# Patient Record
Sex: Male | Born: 1970 | Race: White | Hispanic: No | Marital: Married | Smoking: Current some day smoker
Health system: Southern US, Community
[De-identification: ages and names within clinical notes are randomized; demographics above are authoritative.]

## PROBLEM LIST (undated history)

## (undated) DIAGNOSIS — Z87442 Personal history of urinary calculi: Secondary | ICD-10-CM

## (undated) DIAGNOSIS — K5790 Diverticulosis of intestine, part unspecified, without perforation or abscess without bleeding: Secondary | ICD-10-CM

## (undated) DIAGNOSIS — Z8614 Personal history of Methicillin resistant Staphylococcus aureus infection: Secondary | ICD-10-CM

## (undated) HISTORY — DX: Diverticulosis of intestine, part unspecified, without perforation or abscess without bleeding: K57.90

## (undated) HISTORY — PX: TONSILLECTOMY: SUR1361

## (undated) HISTORY — PX: KNEE ARTHROSCOPY: SHX127

---

## 2018-07-10 ENCOUNTER — Emergency Department (HOSPITAL_COMMUNITY): Payer: BLUE CROSS/BLUE SHIELD

## 2018-07-10 ENCOUNTER — Encounter (HOSPITAL_COMMUNITY): Payer: Self-pay | Admitting: *Deleted

## 2018-07-10 ENCOUNTER — Inpatient Hospital Stay (HOSPITAL_COMMUNITY)
Admission: EM | Admit: 2018-07-10 | Discharge: 2018-07-18 | DRG: 392 | Disposition: A | Discharge status: Home Health | Payer: BLUE CROSS/BLUE SHIELD | Attending: General Surgery | Admitting: General Surgery

## 2018-07-10 ENCOUNTER — Other Ambulatory Visit: Payer: Self-pay

## 2018-07-10 DIAGNOSIS — K5720 Diverticulitis of large intestine with perforation and abscess without bleeding: Principal | ICD-10-CM | POA: Diagnosis present

## 2018-07-10 DIAGNOSIS — K567 Ileus, unspecified: Secondary | ICD-10-CM | POA: Diagnosis present

## 2018-07-10 DIAGNOSIS — K631 Perforation of intestine (nontraumatic): Secondary | ICD-10-CM

## 2018-07-10 DIAGNOSIS — Z23 Encounter for immunization: Secondary | ICD-10-CM | POA: Diagnosis not present

## 2018-07-10 DIAGNOSIS — K651 Peritoneal abscess: Secondary | ICD-10-CM

## 2018-07-10 DIAGNOSIS — F17200 Nicotine dependence, unspecified, uncomplicated: Secondary | ICD-10-CM | POA: Diagnosis present

## 2018-07-10 DIAGNOSIS — K5732 Diverticulitis of large intestine without perforation or abscess without bleeding: Secondary | ICD-10-CM

## 2018-07-10 DIAGNOSIS — R188 Other ascites: Secondary | ICD-10-CM

## 2018-07-10 DIAGNOSIS — R509 Fever, unspecified: Secondary | ICD-10-CM

## 2018-07-10 DIAGNOSIS — E876 Hypokalemia: Secondary | ICD-10-CM

## 2018-07-10 LAB — URINALYSIS, ROUTINE W REFLEX MICROSCOPIC
Bilirubin Urine: NEGATIVE
Glucose, UA: NEGATIVE mg/dL
Hgb urine dipstick: NEGATIVE
Ketones, ur: 80 mg/dL — AB
Leukocytes, UA: NEGATIVE
NITRITE: NEGATIVE
PH: 5 (ref 5.0–8.0)
Protein, ur: NEGATIVE mg/dL

## 2018-07-10 LAB — CBC WITH DIFFERENTIAL/PLATELET
ABS IMMATURE GRANULOCYTES: 0.04 10*3/uL (ref 0.00–0.07)
BASOS ABS: 0 10*3/uL (ref 0.0–0.1)
BASOS PCT: 1 %
Eosinophils Absolute: 0.1 10*3/uL (ref 0.0–0.5)
Eosinophils Relative: 1 %
HCT: 48.5 % (ref 39.0–52.0)
HEMOGLOBIN: 16.4 g/dL (ref 13.0–17.0)
Immature Granulocytes: 1 %
LYMPHS PCT: 18 %
Lymphs Abs: 1.6 10*3/uL (ref 0.7–4.0)
MCH: 33.5 pg (ref 26.0–34.0)
MCHC: 33.8 g/dL (ref 30.0–36.0)
MCV: 99.2 fL (ref 80.0–100.0)
Monocytes Absolute: 0.5 10*3/uL (ref 0.1–1.0)
Monocytes Relative: 6 %
NEUTROS ABS: 6.4 10*3/uL (ref 1.7–7.7)
NRBC: 0 % (ref 0.0–0.2)
Neutrophils Relative %: 73 %
PLATELETS: 265 10*3/uL (ref 150–400)
RBC: 4.89 MIL/uL (ref 4.22–5.81)
RDW: 12.1 % (ref 11.5–15.5)
WBC: 8.7 10*3/uL (ref 4.0–10.5)

## 2018-07-10 LAB — COMPREHENSIVE METABOLIC PANEL
ALBUMIN: 3.3 g/dL — AB (ref 3.5–5.0)
ALK PHOS: 50 U/L (ref 38–126)
ALT: 20 U/L (ref 0–44)
ANION GAP: 12 (ref 5–15)
AST: 20 U/L (ref 15–41)
BUN: 8 mg/dL (ref 6–20)
CALCIUM: 8.9 mg/dL (ref 8.9–10.3)
CHLORIDE: 101 mmol/L (ref 98–111)
CO2: 24 mmol/L (ref 22–32)
Creatinine, Ser: <0.3 mg/dL — ABNORMAL LOW (ref 0.61–1.24)
GLUCOSE: 166 mg/dL — AB (ref 70–99)
Potassium: 3.8 mmol/L (ref 3.5–5.1)
SODIUM: 137 mmol/L (ref 135–145)
Total Bilirubin: 0.7 mg/dL (ref 0.3–1.2)
Total Protein: 6.5 g/dL (ref 6.5–8.1)

## 2018-07-10 LAB — LIPASE, BLOOD: Lipase: 31 U/L (ref 11–51)

## 2018-07-10 MED ORDER — ZOLPIDEM TARTRATE 5 MG PO TABS: 5.0000 mg | ORAL_TABLET | Freq: Every evening | ORAL | Status: DC | PRN

## 2018-07-10 MED ORDER — ONDANSETRON HCL 4 MG/2ML IJ SOLN: 4.0000 mg | Freq: Four times a day (QID) | INTRAMUSCULAR | Status: DC | PRN

## 2018-07-10 MED ORDER — ENOXAPARIN SODIUM 40 MG/0.4ML ~~LOC~~ SOLN
40.0000 mg | SUBCUTANEOUS | Status: DC
  Administered 2018-07-11 – 2018-07-14 (×4): 40 mg via SUBCUTANEOUS
  Filled 2018-07-10 (×4): qty 0.4

## 2018-07-10 MED ORDER — HYDROMORPHONE HCL 1 MG/ML IJ SOLN
0.5000 mg | Freq: Once | INTRAMUSCULAR | Status: AC
  Administered 2018-07-10: 0.5 mg via INTRAVENOUS
  Filled 2018-07-10: qty 1

## 2018-07-10 MED ORDER — OXYCODONE HCL 5 MG PO TABS
5.0000 mg | ORAL_TABLET | ORAL | Status: DC | PRN
  Administered 2018-07-10 – 2018-07-13 (×8): 10 mg via ORAL
  Administered 2018-07-14 – 2018-07-15 (×2): 5 mg via ORAL
  Filled 2018-07-10 (×4): qty 2
  Filled 2018-07-10: qty 1
  Filled 2018-07-10 (×3): qty 2
  Filled 2018-07-10: qty 1
  Filled 2018-07-10 (×2): qty 2

## 2018-07-10 MED ORDER — ACETAMINOPHEN 650 MG RE SUPP: 650.0000 mg | Freq: Four times a day (QID) | RECTAL | Status: DC | PRN

## 2018-07-10 MED ORDER — PIPERACILLIN-TAZOBACTAM 3.375 G IVPB
3.3750 g | Freq: Three times a day (TID) | INTRAVENOUS | Status: DC
  Administered 2018-07-10 – 2018-07-18 (×23): 3.375 g via INTRAVENOUS
  Filled 2018-07-10 (×23): qty 50

## 2018-07-10 MED ORDER — PANTOPRAZOLE SODIUM 40 MG IV SOLR
40.0000 mg | Freq: Every day | INTRAVENOUS | Status: DC
  Administered 2018-07-10: 40 mg via INTRAVENOUS
  Filled 2018-07-10: qty 40

## 2018-07-10 MED ORDER — IOHEXOL 300 MG/ML  SOLN
100.0000 mL | Freq: Once | INTRAMUSCULAR | Status: AC | PRN
  Administered 2018-07-10: 100 mL via INTRAVENOUS

## 2018-07-10 MED ORDER — HYDRALAZINE HCL 20 MG/ML IJ SOLN: 10.0000 mg | INTRAMUSCULAR | Status: DC | PRN

## 2018-07-10 MED ORDER — PNEUMOCOCCAL VAC POLYVALENT 25 MCG/0.5ML IJ INJ
0.5000 mL | INJECTION | INTRAMUSCULAR | Status: AC
  Administered 2018-07-11: 0.5 mL via INTRAMUSCULAR
  Filled 2018-07-10: qty 0.5

## 2018-07-10 MED ORDER — PIPERACILLIN-TAZOBACTAM 3.375 G IVPB 30 MIN
3.3750 g | Freq: Once | INTRAVENOUS | Status: AC
  Administered 2018-07-10: 3.375 g via INTRAVENOUS
  Filled 2018-07-10: qty 50

## 2018-07-10 MED ORDER — SODIUM CHLORIDE 0.9 % IV BOLUS
1000.0000 mL | Freq: Once | INTRAVENOUS | Status: AC
  Administered 2018-07-10: 1000 mL via INTRAVENOUS

## 2018-07-10 MED ORDER — ONDANSETRON 4 MG PO TBDP: 4.0000 mg | ORAL_TABLET | Freq: Four times a day (QID) | ORAL | Status: DC | PRN

## 2018-07-10 MED ORDER — DIPHENHYDRAMINE HCL 50 MG/ML IJ SOLN: 25.0000 mg | Freq: Four times a day (QID) | INTRAMUSCULAR | Status: DC | PRN

## 2018-07-10 MED ORDER — HYDROMORPHONE HCL 1 MG/ML IJ SOLN
1.0000 mg | INTRAMUSCULAR | Status: DC | PRN
  Administered 2018-07-10 – 2018-07-15 (×9): 1 mg via INTRAVENOUS
  Filled 2018-07-10 (×9): qty 1

## 2018-07-10 MED ORDER — DIPHENHYDRAMINE HCL 25 MG PO CAPS
25.0000 mg | ORAL_CAPSULE | Freq: Four times a day (QID) | ORAL | Status: DC | PRN
  Administered 2018-07-13 – 2018-07-14 (×2): 25 mg via ORAL
  Filled 2018-07-10 (×2): qty 1

## 2018-07-10 MED ORDER — KCL IN DEXTROSE-NACL 20-5-0.45 MEQ/L-%-% IV SOLN
INTRAVENOUS | Status: DC
  Administered 2018-07-10 – 2018-07-12 (×4): via INTRAVENOUS
  Filled 2018-07-10 (×4): qty 1000

## 2018-07-10 MED ORDER — HYDROMORPHONE HCL 1 MG/ML IJ SOLN
1.0000 mg | Freq: Once | INTRAMUSCULAR | Status: AC
  Administered 2018-07-10: 1 mg via INTRAVENOUS
  Filled 2018-07-10: qty 1

## 2018-07-10 MED ORDER — ACETAMINOPHEN 325 MG PO TABS
650.0000 mg | ORAL_TABLET | Freq: Four times a day (QID) | ORAL | Status: DC | PRN
  Administered 2018-07-10 – 2018-07-16 (×9): 650 mg via ORAL
  Filled 2018-07-10 (×10): qty 2

## 2018-07-10 MED ORDER — SODIUM CHLORIDE 0.9 % IV SOLN
Freq: Once | INTRAVENOUS | Status: AC
  Administered 2018-07-10: 14:00:00 via INTRAVENOUS

## 2018-07-10 MED ORDER — ONDANSETRON HCL 4 MG/2ML IJ SOLN
4.0000 mg | Freq: Once | INTRAMUSCULAR | Status: AC
  Administered 2018-07-10: 4 mg via INTRAVENOUS
  Filled 2018-07-10: qty 2

## 2018-07-10 MED ORDER — INFLUENZA VAC SPLIT QUAD 0.5 ML IM SUSY
0.5000 mL | PREFILLED_SYRINGE | INTRAMUSCULAR | Status: AC
  Administered 2018-07-11: 0.5 mL via INTRAMUSCULAR
  Filled 2018-07-10: qty 0.5

## 2018-07-10 NOTE — ED Triage Notes (Signed)
PT reports ABD pain started WED this week

## 2018-07-10 NOTE — H&P (Signed)
Douglas Weber is an 47 y.o. male.   Chief Complaint: Lower abdominal pain HPI: Otherwise healthy gentleman developed some lower abdominal pain on Tuesday evening.  On Wednesday it worsened and he had a fever.  It seemed to get better after that but again worsened this morning and he came to the emergency department.  He was evaluated with CT scan of the abdomen pelvis which demonstrated sigmoid diverticulitis with some scattered bubbles of free air.  He was given some pain medication in the emergency department and is doing a little better at this time.  History reviewed. No pertinent past medical history.  History reviewed. No pertinent surgical history.  History reviewed. No pertinent family history. Social History:  reports that he has been smoking. He has never used smokeless tobacco. He reports that he drinks about 1.0 standard drinks of alcohol per week. He reports that he has current or past drug history. Drug: Marijuana.  Allergies: No Known Allergies   (Not in a hospital admission)  Results for orders placed or performed during the hospital encounter of 07/10/18 (from the past 48 hour(s))  CBC with Differential     Status: None   Collection Time: 07/10/18 11:22 AM  Result Value Ref Range   WBC 8.7 4.0 - 10.5 K/uL   RBC 4.89 4.22 - 5.81 MIL/uL   Hemoglobin 16.4 13.0 - 17.0 g/dL   HCT 48.5 39.0 - 52.0 %   MCV 99.2 80.0 - 100.0 fL   MCH 33.5 26.0 - 34.0 pg   MCHC 33.8 30.0 - 36.0 g/dL   RDW 12.1 11.5 - 15.5 %   Platelets 265 150 - 400 K/uL   nRBC 0.0 0.0 - 0.2 %   Neutrophils Relative % 73 %   Neutro Abs 6.4 1.7 - 7.7 K/uL   Lymphocytes Relative 18 %   Lymphs Abs 1.6 0.7 - 4.0 K/uL   Monocytes Relative 6 %   Monocytes Absolute 0.5 0.1 - 1.0 K/uL   Eosinophils Relative 1 %   Eosinophils Absolute 0.1 0.0 - 0.5 K/uL   Basophils Relative 1 %   Basophils Absolute 0.0 0.0 - 0.1 K/uL   Immature Granulocytes 1 %   Abs Immature Granulocytes 0.04 0.00 - 0.07 K/uL    Comment:  Performed at [facility], [address] [place], [place] [address]  Comprehensive metabolic panel     Status: Abnormal   Collection Time: 07/10/18 11:22 AM  Result Value Ref Range   Sodium 137 135 - 145 mmol/L   Potassium 3.8 3.5 - 5.1 mmol/L   Chloride 101 98 - 111 mmol/L   CO2 24 22 - 32 mmol/L   Glucose, Bld 166 (H) 70 - 99 mg/dL   BUN 8 6 - 20 mg/dL   Creatinine, Ser <0.30 (L) 0.61 - 1.24 mg/dL   Calcium 8.9 8.9 - 10.3 mg/dL   Total Protein 6.5 6.5 - 8.1 g/dL   Albumin 3.3 (L) 3.5 - 5.0 g/dL   AST 20 15 - 41 U/L   ALT 20 0 - 44 U/L   Alkaline Phosphatase 50 38 - 126 U/L   Total Bilirubin 0.7 0.3 - 1.2 mg/dL   GFR calc non Af Amer NOT CALCULATED >60 mL/min   GFR calc Af Amer NOT CALCULATED >60 mL/min    Comment: (NOTE) The eGFR has been calculated using the CKD EPI equation. This calculation has not been validated in all clinical situations. eGFR's persistently <60 mL/min signify possible Chronic Kidney Disease.  Anion gap 12 5 - 15    Comment: Performed at [facility] [address] [place], [place] [address]  Lipase, blood     Status: None   Collection Time: 07/10/18 11:22 AM  Result Value Ref Range   Lipase 31 11 - 51 U/L    Comment: Performed at [facility] [address] [place], [place] [address]   Ct Abdomen Pelvis W Contrast  Result Date: 07/10/2018 CLINICAL DATA:  47 year old male with generalize abdominal pain and nausea since 10 a.m. EXAM: CT ABDOMEN AND PELVIS WITH CONTRAST TECHNIQUE: Multidetector CT imaging of the abdomen and pelvis was performed using the standard protocol following bolus administration of intravenous contrast. CONTRAST:  188m OMNIPAQUE IOHEXOL 300 MG/ML  SOLN COMPARISON:  No priors. FINDINGS: Lower chest: There are no aggressive appearing lytic or blastic lesions noted in the visualized portions of the skeleton. Hepatobiliary: No suspicious cystic or solid hepatic lesions. No intra or extrahepatic biliary ductal  dilatation. Gallbladder is normal in appearance. Pancreas: No pancreatic mass. No pancreatic ductal dilatation. No pancreatic or peripancreatic fluid or inflammatory changes. Spleen: Unremarkable. Adrenals/Urinary Tract: Bilateral kidneys and adrenal glands are normal in appearance. No hydroureteronephrosis. Urinary bladder is normal in appearance. Stomach/Bowel: Normal appearance of the stomach. No pathologic dilatation of small bowel or colon. Numerous colonic diverticulae are noted. In the region of the mid sigmoid colon there are extensive inflammatory changes adjacent to several diverticulae where there is also extensive mural thickening, and a small amount of extraluminal gas extending into the sigmoid mesocolon, compatible with an acute perforated diverticulitis. Appendix is not confidently identified may be surgically absent. Vascular/Lymphatic: Aortic atherosclerosis, without evidence of aneurysm or dissection in the abdominal or pelvic vasculature. Circumaortic left renal vein (normal anatomical variant) incidentally noted. No lymphadenopathy noted in the abdomen or pelvis. Reproductive: Prostate gland and seminal vesicles are unremarkable in appearance. Other: Large amount of pneumoperitoneum. Trace amount of fluid in the left pericolic gutter and in the low left hemipelvis. Musculoskeletal: There are no aggressive appearing lytic or blastic lesions noted in the visualized portions of the skeleton. IMPRESSION: 1. Perforated acute diverticulitis in the mid sigmoid colon. Emergent surgical consultation is strongly recommended. 2. Aortic atherosclerosis. Critical Value/emergent results were called by telephone at the time of interpretation on 07/10/2018 at 1:43 pm to Dr. MAletta Edouard who verbally acknowledged these results. Electronically Signed   By: DVinnie LangtonM.D.   On: 07/10/2018 13:45    Review of Systems  Constitutional: Positive for fever.  HENT: Negative for hearing loss.   Eyes:  Negative for blurred vision.  Respiratory: Negative for cough and shortness of breath.   Cardiovascular: Negative for chest pain.  Gastrointestinal: Positive for abdominal pain. Negative for blood in stool, nausea and vomiting.  Genitourinary: Negative.   Musculoskeletal: Negative.   Skin: Negative.   Neurological: Negative.   Endo/Heme/Allergies: Negative.   Psychiatric/Behavioral: Negative.     Blood pressure 124/82, pulse 79, temperature 97.9 F (36.6 C), temperature source Oral, resp. rate 20, height '5\' 7"'$  (1.702 m), weight 78 kg, SpO2 97 %. Physical Exam  Constitutional: He is oriented to person, place, and time. He appears well-developed and well-nourished. No distress.  HENT:  Head: Normocephalic.  Right Ear: External ear normal.  Left Ear: External ear normal.  Nose: Nose normal.  Mouth/Throat: Oropharynx is clear and moist.  Eyes: Pupils are equal, round, and reactive to light. EOM are normal.  Neck: Neck supple. No tracheal deviation present. No  thyromegaly present.  Cardiovascular: Normal rate, regular rhythm, normal heart sounds and intact distal pulses.  Respiratory: Effort normal and breath sounds normal. No respiratory distress. He has no wheezes. He has no rales.  GI: Soft. He exhibits no distension. There is tenderness. There is guarding. There is no rebound.  Tender in the left lower quadrant and centrally with some voluntary guarding, no generalized peritonitis  Musculoskeletal: Normal range of motion.  Neurological: He is alert and oriented to person, place, and time.  Skin: Skin is warm and dry.  Psychiatric: He has a normal mood and affect.     Assessment/Plan Acute sigmoid diverticulitis with perforation and some small scattered bubbles of free intraperitoneal air - IV Zosyn, admit, bowel rest.  This should improve with treatment.  If he worsens, I did discuss with him that he would need a partial colectomy with colostomy.  He is hoping this resolves with  antibiotic treatment.  Zenovia Jarred, MD 07/10/2018, 2:30 PM

## 2018-07-10 NOTE — ED Notes (Signed)
Patient transported to CT 

## 2018-07-10 NOTE — Plan of Care (Signed)
  Problem: Coping: Goal: Level of anxiety will decrease Outcome: Not Progressing   Problem: Pain Managment: Goal: General experience of comfort will improve Outcome: Not Progressing   

## 2018-07-10 NOTE — ED Provider Notes (Signed)
[facility] EMERGENCY DEPARTMENT Provider Note   CSN: [id number] Arrival date & time: 07/10/18  1051     History   Chief Complaint Chief Complaint  Patient presents with  . Abdominal Pain    HPI Douglas Weber is a 47 y.o. male.  He presents to the hospital with severe abdominal pain.  He said it started on Wednesday when he had a fever to 101.5 and thought he had a GI bug.  He went to urgent care on Thursday and they checked a urinalysis and told him he had some blood and sugar in his urine.  He was feeling better yesterday and even today when he woke up.  Yellow once experience severe 10 out of 10 lower abdominal pain.  It is associated with nausea and vomiting, no blood from above or below.  He is never had anything like this before.  Denies any recent travel or sick contacts.  He is tried nothing for it.  The history is provided by the patient.  Abdominal Pain   This is a new problem. Episode onset: 4 days - worse today. The problem has been rapidly worsening. The pain is associated with an unknown factor. The pain is located in the LLQ, RLQ and suprapubic region. The quality of the pain is sharp and cramping. The pain is at a severity of 10/10. Associated symptoms include fever, diarrhea, nausea and vomiting. Pertinent negatives include melena, dysuria, frequency and headaches. Nothing aggravates the symptoms. Nothing relieves the symptoms.    History reviewed. No pertinent past medical history.  There are no active problems to display for this patient.   History reviewed. No pertinent surgical history.      Home Medications    Prior to Admission medications   Not on File    Family History History reviewed. No pertinent family history.  Social History Social History   Tobacco Use  . Smoking status: Current Some Day Smoker  . Smokeless tobacco: Never Used  Substance Use Topics  . Alcohol use: Yes    Alcohol/week: 1.0 standard drinks    Types:  1 Glasses of wine per week  . Drug use: Yes    Types: Marijuana    Comment: unsure     Allergies   Patient has no known allergies.   Review of Systems Review of Systems  Constitutional: Positive for fever.  HENT: Negative for sore throat.   Eyes: Negative for visual disturbance.  Respiratory: Negative for shortness of breath.   Cardiovascular: Negative for chest pain.  Gastrointestinal: Positive for abdominal pain, diarrhea, nausea and vomiting. Negative for melena.  Genitourinary: Negative for dysuria and frequency.  Musculoskeletal: Negative for back pain.  Skin: Negative for rash.  Neurological: Negative for headaches.     Physical Exam Updated Vital Signs BP 127/89   Pulse 75   Temp 97.9 F (36.6 C) (Oral)   Resp 20   Ht 5\' 7"  (1.702 m)   Wt 78 kg   SpO2 (!) 10%   BMI 26.94 kg/m   Physical Exam  Constitutional: He is oriented to person, place, and time. He appears well-developed and well-nourished.  HENT:  Head: Normocephalic and atraumatic.  Eyes: Conjunctivae are normal.  Neck: Neck supple.  Cardiovascular: Normal rate and regular rhythm.  No murmur heard. Pulmonary/Chest: Effort normal and breath sounds normal. No respiratory distress.  Abdominal: Soft. He exhibits no mass. There is tenderness in the right lower quadrant and left lower quadrant. There is guarding. There  is no rigidity.  Musculoskeletal: He exhibits no edema, tenderness or deformity.  Neurological: He is alert and oriented to person, place, and time.  Skin: Skin is warm and dry. Capillary refill takes less than 2 seconds.  Psychiatric: He has a normal mood and affect.  Nursing note and vitals reviewed.    ED Treatments / Results  Labs (all labs ordered are listed, but only abnormal results are displayed) Labs Reviewed  COMPREHENSIVE METABOLIC PANEL - Abnormal; Notable for the following components:      Result Value   Glucose, Bld 166 (*)    Creatinine, Ser <0.30 (*)    Albumin  3.3 (*)    All other components within normal limits  URINALYSIS, ROUTINE W REFLEX MICROSCOPIC - Abnormal; Notable for the following components:   Specific Gravity, Urine >1.046 (*)    Ketones, ur 80 (*)    All other components within normal limits  CBC WITH DIFFERENTIAL/PLATELET  LIPASE, BLOOD  HIV ANTIBODY (ROUTINE TESTING W REFLEX)    EKG None  Radiology Ct Abdomen Pelvis W Contrast  Result Date: 07/10/2018 CLINICAL DATA:  47 year old male with generalize abdominal pain and nausea since 10 a.m. EXAM: CT ABDOMEN AND PELVIS WITH CONTRAST TECHNIQUE: Multidetector CT imaging of the abdomen and pelvis was performed using the standard protocol following bolus administration of intravenous contrast. CONTRAST:  131mL OMNIPAQUE IOHEXOL 300 MG/ML  SOLN COMPARISON:  No priors. FINDINGS: Lower chest: There are no aggressive appearing lytic or blastic lesions noted in the visualized portions of the skeleton. Hepatobiliary: No suspicious cystic or solid hepatic lesions. No intra or extrahepatic biliary ductal dilatation. Gallbladder is normal in appearance. Pancreas: No pancreatic mass. No pancreatic ductal dilatation. No pancreatic or peripancreatic fluid or inflammatory changes. Spleen: Unremarkable. Adrenals/Urinary Tract: Bilateral kidneys and adrenal glands are normal in appearance. No hydroureteronephrosis. Urinary bladder is normal in appearance. Stomach/Bowel: Normal appearance of the stomach. No pathologic dilatation of small bowel or colon. Numerous colonic diverticulae are noted. In the region of the mid sigmoid colon there are extensive inflammatory changes adjacent to several diverticulae where there is also extensive mural thickening, and a small amount of extraluminal gas extending into the sigmoid mesocolon, compatible with an acute perforated diverticulitis. Appendix is not confidently identified may be surgically absent. Vascular/Lymphatic: Aortic atherosclerosis, without evidence of aneurysm  or dissection in the abdominal or pelvic vasculature. Circumaortic left renal vein (normal anatomical variant) incidentally noted. No lymphadenopathy noted in the abdomen or pelvis. Reproductive: Prostate gland and seminal vesicles are unremarkable in appearance. Other: Large amount of pneumoperitoneum. Trace amount of fluid in the left pericolic gutter and in the low left hemipelvis. Musculoskeletal: There are no aggressive appearing lytic or blastic lesions noted in the visualized portions of the skeleton. IMPRESSION: 1. Perforated acute diverticulitis in the mid sigmoid colon. Emergent surgical consultation is strongly recommended. 2. Aortic atherosclerosis. Critical Value/emergent results were called by telephone at the time of interpretation on 07/10/2018 at 1:43 pm to Dr. Aletta Edouard, who verbally acknowledged these results. Electronically Signed   By: Vinnie Langton M.D.   On: 07/10/2018 13:45    Procedures Procedures (including critical care time)  Medications Ordered in ED Medications  HYDROmorphone (DILAUDID) injection 1 mg (has no administration in time range)  ondansetron (ZOFRAN) injection 4 mg (has no administration in time range)  sodium chloride 0.9 % bolus 1,000 mL (has no administration in time range)     Initial Impression / Assessment and Plan / ED Course  I have reviewed  the triage vital signs and the nursing notes.  Pertinent labs & imaging results that were available during my care of the patient were reviewed by me and considered in my medical decision making (see chart for details).  Clinical Course as of Jul 11 1539  Sat Jul 10, 2018  1243 Patient states his pain is improved but still is uncomfortable.  Of updated him on his results so far and he understands he is waiting for CT.  Of asked him not to eat or drink but he is asking for something disturbance his mouth.   [MB]  9675 Reviewed the results with the patient and his wife.  I have ordered Zosyn for  antibiotics and have consulted general surgery.  Dr. Grandville Silos will evaluate the patient.   [MB]  1450 Patient seen by Dr. Grandville Silos and he will admit to his service.   [MB]    Clinical Course User Index [MB] Hayden Rasmussen, MD      Final Clinical Impressions(s) / ED Diagnoses   Final diagnoses:  Diverticulitis of colon  Intestinal perforation [facility])    ED Discharge Orders    None       Hayden Rasmussen, MD 07/10/18 1541

## 2018-07-11 LAB — CBC
HEMATOCRIT: 47.3 % (ref 39.0–52.0)
Hemoglobin: 16.5 g/dL (ref 13.0–17.0)
MCH: 33.8 pg (ref 26.0–34.0)
MCHC: 34.9 g/dL (ref 30.0–36.0)
MCV: 96.9 fL (ref 80.0–100.0)
NRBC: 0 % (ref 0.0–0.2)
Platelets: 207 10*3/uL (ref 150–400)
RBC: 4.88 MIL/uL (ref 4.22–5.81)
RDW: 11.9 % (ref 11.5–15.5)
WBC: 4.9 10*3/uL (ref 4.0–10.5)

## 2018-07-11 LAB — HIV ANTIBODY (ROUTINE TESTING W REFLEX): HIV Screen 4th Generation wRfx: NONREACTIVE

## 2018-07-11 LAB — BASIC METABOLIC PANEL
Anion gap: 9 (ref 5–15)
BUN: 8 mg/dL (ref 6–20)
CALCIUM: 8.4 mg/dL — AB (ref 8.9–10.3)
CHLORIDE: 102 mmol/L (ref 98–111)
CO2: 23 mmol/L (ref 22–32)
CREATININE: 1.23 mg/dL (ref 0.61–1.24)
GFR calc non Af Amer: >60 mL/min (ref >60)
Glucose, Bld: 151 mg/dL — ABNORMAL HIGH (ref 70–99)
Potassium: 4.1 mmol/L (ref 3.5–5.1)
SODIUM: 134 mmol/L — AB (ref 135–145)

## 2018-07-11 MED ORDER — FAMOTIDINE IN NACL 20-0.9 MG/50ML-% IV SOLN
20.0000 mg | INTRAVENOUS | Status: DC
  Administered 2018-07-11 – 2018-07-12 (×2): 20 mg via INTRAVENOUS
  Filled 2018-07-11 (×3): qty 50

## 2018-07-11 NOTE — Progress Notes (Addendum)
Subjective/Chief Complaint: States that pain is significantly improved.  Denies n/v.  No flatus.     Objective: Vital signs in last 24 hours: Temp:  [97.9 F (36.6 C)-100.1 F (37.8 C)] 98.5 F (36.9 C) (11/10 0600) Pulse Rate:  [72-127] 109 (11/10 0600) Resp:  [16-22] 18 (11/10 0600) BP: (120-151)/(72-92) 127/72 (11/10 0600) SpO2:  [94 %-100 %] 97 % (11/10 0600) Weight:  [78 kg] 78 kg (11/09 1058) Last BM Date: 07/10/18  Intake/Output from previous day: 11/09 0701 - 11/10 0700 In: 1305.4 [I.V.:1260.2; IV Piggyback:45.3] Out: -  Intake/Output this shift: No intake/output data recorded.  General appearance: alert, cooperative and no distress Resp: breathing comfortably. GI: soft, mildly distended.  tender LLQ, suprapubic region.  no tenderness upper or right abdomen Extremities: extremities normal, atraumatic, no cyanosis or edema  Lab Results:  Recent Labs    07/10/18 1122 07/11/18 0432  WBC 8.7 4.9  HGB 16.4 16.5  HCT 48.5 47.3  PLT 265 207   BMET Recent Labs    07/10/18 1122 07/11/18 0432  NA 137 134*  K 3.8 4.1  CL 101 102  CO2 24 23  GLUCOSE 166* 151*  BUN 8 8  CREATININE <0.30* 1.23  CALCIUM 8.9 8.4*   PT/INR No results for input(s): LABPROT, INR in the last 72 hours. ABG No results for input(s): PHART, HCO3 in the last 72 hours.  Invalid input(s): PCO2, PO2  Studies/Results: Ct Abdomen Pelvis W Contrast  Result Date: 07/10/2018 CLINICAL DATA:  47 year old male with generalize abdominal pain and nausea since 10 a.m. EXAM: CT ABDOMEN AND PELVIS WITH CONTRAST TECHNIQUE: Multidetector CT imaging of the abdomen and pelvis was performed using the standard protocol following bolus administration of intravenous contrast. CONTRAST:  181mL OMNIPAQUE IOHEXOL 300 MG/ML  SOLN COMPARISON:  No priors. FINDINGS: Lower chest: There are no aggressive appearing lytic or blastic lesions noted in the visualized portions of the skeleton. Hepatobiliary: No  suspicious cystic or solid hepatic lesions. No intra or extrahepatic biliary ductal dilatation. Gallbladder is normal in appearance. Pancreas: No pancreatic mass. No pancreatic ductal dilatation. No pancreatic or peripancreatic fluid or inflammatory changes. Spleen: Unremarkable. Adrenals/Urinary Tract: Bilateral kidneys and adrenal glands are normal in appearance. No hydroureteronephrosis. Urinary bladder is normal in appearance. Stomach/Bowel: Normal appearance of the stomach. No pathologic dilatation of small bowel or colon. Numerous colonic diverticulae are noted. In the region of the mid sigmoid colon there are extensive inflammatory changes adjacent to several diverticulae where there is also extensive mural thickening, and a small amount of extraluminal gas extending into the sigmoid mesocolon, compatible with an acute perforated diverticulitis. Appendix is not confidently identified may be surgically absent. Vascular/Lymphatic: Aortic atherosclerosis, without evidence of aneurysm or dissection in the abdominal or pelvic vasculature. Circumaortic left renal vein (normal anatomical variant) incidentally noted. No lymphadenopathy noted in the abdomen or pelvis. Reproductive: Prostate gland and seminal vesicles are unremarkable in appearance. Other: Large amount of pneumoperitoneum. Trace amount of fluid in the left pericolic gutter and in the low left hemipelvis. Musculoskeletal: There are no aggressive appearing lytic or blastic lesions noted in the visualized portions of the skeleton. IMPRESSION: 1. Perforated acute diverticulitis in the mid sigmoid colon. Emergent surgical consultation is strongly recommended. 2. Aortic atherosclerosis. Critical Value/emergent results were called by telephone at the time of interpretation on 07/10/2018 at 1:43 pm to Dr. Aletta Edouard, who verbally acknowledged these results. Electronically Signed   By: Vinnie Langton M.D.   On: 07/10/2018 13:45  Anti-infectives: Anti-infectives (From admission, onward)   Start     Dose/Rate Route Frequency Ordered Stop   07/10/18 2230  piperacillin-tazobactam (ZOSYN) IVPB 3.375 g     3.375 g 12.5 mL/hr over 240 Minutes Intravenous Every 8 hours 07/10/18 1454     07/10/18 1400  piperacillin-tazobactam (ZOSYN) IVPB 3.375 g     3.375 g 100 mL/hr over 30 Minutes Intravenous  Once 07/10/18 1348 07/10/18 1457      Assessment/Plan: s/p * No surgery found * sigmoid diverticulitis with microperforation.   Improving clinically. Continue IV antibiotics (zosyn d2) Sips/ice chips only. Bowel rest. Change to H2 blocker from PPI lovenox for vte ppx.   LOS: 1 day    Stark Klein 07/11/2018

## 2018-07-11 NOTE — Plan of Care (Signed)
  Problem: Education: Goal: Knowledge of General Education information will improve Description Including pain rating scale, medication(s)/side effects and non-pharmacologic comfort measures Outcome: Progressing   Problem: Health Behavior/Discharge Planning: Goal: Ability to manage health-related needs will improve Outcome: Progressing   

## 2018-07-12 MED ORDER — SODIUM CHLORIDE 0.9 % IV SOLN
INTRAVENOUS | Status: DC
  Administered 2018-07-12 (×2): via INTRAVENOUS

## 2018-07-12 MED ORDER — DOCUSATE SODIUM 100 MG PO CAPS
100.0000 mg | ORAL_CAPSULE | Freq: Two times a day (BID) | ORAL | Status: DC
  Administered 2018-07-12 – 2018-07-16 (×7): 100 mg via ORAL
  Filled 2018-07-12 (×9): qty 1

## 2018-07-12 NOTE — Progress Notes (Signed)
Subjective/Chief Complaint: Had a great night. Slept well. Minimal pain.  Had a bowel movement this morning.    Objective: Vital signs in last 24 hours: Temp:  [98 F (36.7 C)-99.1 F (37.3 C)] 99.1 F (37.3 C) (11/11 0500) Pulse Rate:  [103-108] 103 (11/11 0500) Resp:  [18] 18 (11/11 0500) BP: (108-131)/(67-97) 130/81 (11/11 0500) SpO2:  [95 %-99 %] 95 % (11/11 0500) Last BM Date: 07/10/18  Intake/Output from previous day: 11/10 0701 - 11/11 0700 In: 850 [I.V.:800; IV Piggyback:50] Out: -  Intake/Output this shift: No intake/output data recorded.  General appearance: alert, cooperative and no distress Resp: breathing comfortably. GI: soft, mildly distended.  Mildly tender lateral LLQ, suprapubic region.  no tenderness upper or right abdomen Extremities: extremities normal, atraumatic, no cyanosis or edema  Lab Results:  Recent Labs    07/10/18 1122 07/11/18 0432  WBC 8.7 4.9  HGB 16.4 16.5  HCT 48.5 47.3  PLT 265 207   BMET Recent Labs    07/10/18 1122 07/11/18 0432  NA 137 134*  K 3.8 4.1  CL 101 102  CO2 24 23  GLUCOSE 166* 151*  BUN 8 8  CREATININE <0.30* 1.23  CALCIUM 8.9 8.4*   PT/INR No results for input(s): LABPROT, INR in the last 72 hours. ABG No results for input(s): PHART, HCO3 in the last 72 hours.  Invalid input(s): PCO2, PO2  Studies/Results: Ct Abdomen Pelvis W Contrast  Result Date: 07/10/2018 CLINICAL DATA:  47 year old male with generalize abdominal pain and nausea since 10 a.m. EXAM: CT ABDOMEN AND PELVIS WITH CONTRAST TECHNIQUE: Multidetector CT imaging of the abdomen and pelvis was performed using the standard protocol following bolus administration of intravenous contrast. CONTRAST:  11mL OMNIPAQUE IOHEXOL 300 MG/ML  SOLN COMPARISON:  No priors. FINDINGS: Lower chest: There are no aggressive appearing lytic or blastic lesions noted in the visualized portions of the skeleton. Hepatobiliary: No suspicious cystic or solid  hepatic lesions. No intra or extrahepatic biliary ductal dilatation. Gallbladder is normal in appearance. Pancreas: No pancreatic mass. No pancreatic ductal dilatation. No pancreatic or peripancreatic fluid or inflammatory changes. Spleen: Unremarkable. Adrenals/Urinary Tract: Bilateral kidneys and adrenal glands are normal in appearance. No hydroureteronephrosis. Urinary bladder is normal in appearance. Stomach/Bowel: Normal appearance of the stomach. No pathologic dilatation of small bowel or colon. Numerous colonic diverticulae are noted. In the region of the mid sigmoid colon there are extensive inflammatory changes adjacent to several diverticulae where there is also extensive mural thickening, and a small amount of extraluminal gas extending into the sigmoid mesocolon, compatible with an acute perforated diverticulitis. Appendix is not confidently identified may be surgically absent. Vascular/Lymphatic: Aortic atherosclerosis, without evidence of aneurysm or dissection in the abdominal or pelvic vasculature. Circumaortic left renal vein (normal anatomical variant) incidentally noted. No lymphadenopathy noted in the abdomen or pelvis. Reproductive: Prostate gland and seminal vesicles are unremarkable in appearance. Other: Large amount of pneumoperitoneum. Trace amount of fluid in the left pericolic gutter and in the low left hemipelvis. Musculoskeletal: There are no aggressive appearing lytic or blastic lesions noted in the visualized portions of the skeleton. IMPRESSION: 1. Perforated acute diverticulitis in the mid sigmoid colon. Emergent surgical consultation is strongly recommended. 2. Aortic atherosclerosis. Critical Value/emergent results were called by telephone at the time of interpretation on 07/10/2018 at 1:43 pm to Dr. Aletta Edouard, who verbally acknowledged these results. Electronically Signed   By: Vinnie Langton M.D.   On: 07/10/2018 13:45    Anti-infectives: Anti-infectives (From  admission, onward)   Start     Dose/Rate Route Frequency Ordered Stop   07/10/18 2230  piperacillin-tazobactam (ZOSYN) IVPB 3.375 g     3.375 g 12.5 mL/hr over 240 Minutes Intravenous Every 8 hours 07/10/18 1454     07/10/18 1400  piperacillin-tazobactam (ZOSYN) IVPB 3.375 g     3.375 g 100 mL/hr over 30 Minutes Intravenous  Once 07/10/18 1348 07/10/18 1457      Assessment/Plan: s/p * No surgery found * sigmoid diverticulitis with microperforation.   Improving clinically. Continue IV antibiotics (zosyn d2) Try clears today lovenox for vte ppx.   LOS: 2 days    Douglas Weber 07/12/2018

## 2018-07-12 NOTE — Plan of Care (Signed)
  Problem: Activity: Goal: Risk for activity intolerance will decrease Outcome: Progressing   Problem: Nutrition: Goal: Adequate nutrition will be maintained Outcome: Progressing   Problem: Coping: Goal: Level of anxiety will decrease Outcome: Progressing   Problem: Pain Managment: Goal: General experience of comfort will improve Outcome: Progressing   Problem: Safety: Goal: Ability to remain free from injury will improve Outcome: Progressing   

## 2018-07-13 LAB — BASIC METABOLIC PANEL
ANION GAP: 7 (ref 5–15)
BUN: 7 mg/dL (ref 6–20)
CALCIUM: 8.1 mg/dL — AB (ref 8.9–10.3)
CO2: 24 mmol/L (ref 22–32)
Chloride: 101 mmol/L (ref 98–111)
Creatinine, Ser: 1.18 mg/dL (ref 0.61–1.24)
GLUCOSE: 92 mg/dL (ref 70–99)
POTASSIUM: 3.6 mmol/L (ref 3.5–5.1)
SODIUM: 132 mmol/L — AB (ref 135–145)

## 2018-07-13 LAB — MAGNESIUM: MAGNESIUM: 1.8 mg/dL (ref 1.7–2.4)

## 2018-07-13 LAB — CBC
HCT: 40.7 % (ref 39.0–52.0)
HEMOGLOBIN: 14.1 g/dL (ref 13.0–17.0)
MCH: 33.5 pg (ref 26.0–34.0)
MCHC: 34.6 g/dL (ref 30.0–36.0)
MCV: 96.7 fL (ref 80.0–100.0)
PLATELETS: 211 10*3/uL (ref 150–400)
RBC: 4.21 MIL/uL — ABNORMAL LOW (ref 4.22–5.81)
RDW: 12 % (ref 11.5–15.5)
WBC: 8.9 10*3/uL (ref 4.0–10.5)
nRBC: 0 % (ref 0.0–0.2)

## 2018-07-13 MED ORDER — FAMOTIDINE 20 MG PO TABS
20.0000 mg | ORAL_TABLET | Freq: Every day | ORAL | Status: DC
  Administered 2018-07-13 – 2018-07-17 (×5): 20 mg via ORAL
  Filled 2018-07-13 (×5): qty 1

## 2018-07-13 NOTE — Progress Notes (Signed)
    CC: Abdominal pain  Subjective: He is doing better.  He still points to an area left posterior lateral flank that is tender.  He did well with clear liquids.  He complains of voiding a lot.  Otherwise doing well.  Objective: Vital signs in last 24 hours: Temp:  [98.8 F (37.1 C)-100.4 F (38 C)] 99.7 F (37.6 C) (11/12 0501) Pulse Rate:  [83-104] 83 (11/12 0501) Resp:  [16-18] 16 (11/12 0501) BP: (122-143)/(74-93) 122/88 (11/12 0501) SpO2:  [94 %-96 %] 96 % (11/12 0501) Last BM Date: 07/10/18 740PO 750 IV Urine x 6 Afebrile, VSS BMP OK  WBC 8.9   Intake/Output from previous day: 11/11 0701 - 11/12 0700 In: 2620 [P.O.:740; I.V.:750; IV Piggyback:100] Out: -  Intake/Output this shift: No intake/output data recorded.  General appearance: alert, cooperative and no distress Resp: clear to auscultation bilaterally GI: Soft, nontender except for the left lateral posterior flank.  Positive bowel sounds.  He is still slightly distended.  Lab Results:  Recent Labs    07/11/18 0432 07/13/18 0409  WBC 4.9 8.9  HGB 16.5 14.1  HCT 47.3 40.7  PLT 207 211    BMET Recent Labs    07/11/18 0432 07/13/18 0409  NA 134* 132*  K 4.1 3.6  CL 102 101  CO2 23 24  GLUCOSE 151* 92  BUN 8 7  CREATININE 1.23 1.18  CALCIUM 8.4* 8.1*   PT/INR No results for input(s): LABPROT, INR in the last 72 hours.  Recent Labs  Lab 07/10/18 1122  AST 20  ALT 20  ALKPHOS 50  BILITOT 0.7  PROT 6.5  ALBUMIN 3.3*     Lipase     Component Value Date/Time   LIPASE 31 07/10/2018 1122     Medications: . docusate sodium  100 mg Oral BID  . enoxaparin (LOVENOX) injection  40 mg Subcutaneous Q24H   . sodium chloride 125 mL/hr at 07/12/18 1743  . famotidine (PEPCID) IV 20 mg (07/12/18 0933)  . piperacillin-tazobactam (ZOSYN)  IV 3.375 g (07/13/18 0516)   Anti-infectives (From admission, onward)   Start     Dose/Rate Route Frequency Ordered Stop   07/10/18 2230   piperacillin-tazobactam (ZOSYN) IVPB 3.375 g     3.375 g 12.5 mL/hr over 240 Minutes Intravenous Every 8 hours 07/10/18 1454     07/10/18 1400  piperacillin-tazobactam (ZOSYN) IVPB 3.375 g     3.375 g 100 mL/hr over 30 Minutes Intravenous  Once 07/10/18 1348 07/10/18 1457      Assessment/Plan  Acute sigmoid diverticulitis with microperforation.    FEN:  IV fluids/clears ID: Zosyn 11/9 =>>day 4 DVT: Lovenox Follow-up: To be determined  Plan: Continue IV antibiotics, advance diet, if he continues to do well convert him over to oral antibiotics and aim for discharge in the next 24 hours.  Add incentive spirometry, and have him ambulate more.       LOS: 3 days    Sherrilynn Gudgel 07/13/2018 [phone]

## 2018-07-14 ENCOUNTER — Inpatient Hospital Stay (HOSPITAL_COMMUNITY): Payer: BLUE CROSS/BLUE SHIELD

## 2018-07-14 ENCOUNTER — Encounter (HOSPITAL_COMMUNITY): Payer: Self-pay | Admitting: Physician Assistant

## 2018-07-14 LAB — BASIC METABOLIC PANEL
Anion gap: 8 (ref 5–15)
BUN: 6 mg/dL (ref 6–20)
CHLORIDE: 101 mmol/L (ref 98–111)
CO2: 24 mmol/L (ref 22–32)
Calcium: 7.8 mg/dL — ABNORMAL LOW (ref 8.9–10.3)
Creatinine, Ser: 0.97 mg/dL (ref 0.61–1.24)
GFR calc non Af Amer: >60 mL/min (ref >60)
Glucose, Bld: 97 mg/dL (ref 70–99)
POTASSIUM: 3.1 mmol/L — AB (ref 3.5–5.1)
SODIUM: 133 mmol/L — AB (ref 135–145)

## 2018-07-14 LAB — URINALYSIS, ROUTINE W REFLEX MICROSCOPIC
BACTERIA UA: NONE SEEN
BILIRUBIN URINE: NEGATIVE
Glucose, UA: NEGATIVE mg/dL
Ketones, ur: 20 mg/dL — AB
Leukocytes, UA: NEGATIVE
NITRITE: NEGATIVE
Protein, ur: NEGATIVE mg/dL
Specific Gravity, Urine: >1.046 — ABNORMAL HIGH (ref 1.005–1.030)
pH: 6 (ref 5.0–8.0)

## 2018-07-14 LAB — CBC
HEMATOCRIT: 41.4 % (ref 39.0–52.0)
HEMOGLOBIN: 14.4 g/dL (ref 13.0–17.0)
MCH: 33.3 pg (ref 26.0–34.0)
MCHC: 34.8 g/dL (ref 30.0–36.0)
MCV: 95.8 fL (ref 80.0–100.0)
Platelets: 237 10*3/uL (ref 150–400)
RBC: 4.32 MIL/uL (ref 4.22–5.81)
RDW: 12.1 % (ref 11.5–15.5)
WBC: 10.4 10*3/uL (ref 4.0–10.5)
nRBC: 0 % (ref 0.0–0.2)

## 2018-07-14 LAB — MAGNESIUM: MAGNESIUM: 1.8 mg/dL (ref 1.7–2.4)

## 2018-07-14 MED ORDER — ENOXAPARIN SODIUM 40 MG/0.4ML ~~LOC~~ SOLN
40.0000 mg | SUBCUTANEOUS | Status: DC
  Administered 2018-07-16 – 2018-07-18 (×3): 40 mg via SUBCUTANEOUS
  Filled 2018-07-14 (×3): qty 0.4

## 2018-07-14 MED ORDER — IOHEXOL 300 MG/ML  SOLN
100.0000 mL | Freq: Once | INTRAMUSCULAR | Status: AC | PRN
  Administered 2018-07-14: 100 mL via INTRAVENOUS

## 2018-07-14 MED ORDER — KCL IN DEXTROSE-NACL 40-5-0.9 MEQ/L-%-% IV SOLN
INTRAVENOUS | Status: DC
  Administered 2018-07-14 – 2018-07-17 (×2): via INTRAVENOUS
  Filled 2018-07-14 (×4): qty 1000

## 2018-07-14 NOTE — Progress Notes (Addendum)
    CC: Abdominal pain  Subjective: Patient reports he had a difficult evening.  He was diaphoretic to soak the bed.,  Loose diarrheal type stools.  Continues to have pain is perhaps more tender on that left flank today than yesterday.  He did walk a lot yesterday but says he was sore and more tender after doing this.  He also said he was exhausted by this.  He did not take much in orally on full liquids as it just did not appeal to him.  Objective: Vital signs in last 24 hours: Temp:  [98.8 F (37.1 C)-100 F (37.8 C)] 99.1 F (37.3 C) (11/13 0533) Pulse Rate:  [75-83] 75 (11/13 0533) Resp:  [16] 16 (11/13 0533) BP: (130-142)/(67-92) 142/92 (11/13 0533) SpO2:  [96 %-98 %] 98 % (11/13 0533) Last BM Date: 07/13/18 300 p.o. 2400 IV Urine x7 Stool x2 T-max 100 at 1900 hrs. last p.m. BP slightly elevated 130/90 - 140/92 last 2 vital signs. CBC shows WBC 10.4, hemoglobin 14, hematocrit 41.4, platelets 237,000. CT on admit:  11/9:Large amount of pneumoperitoneum. Trace amount of fluid in the left pericolic gutter and in the low left hemipelvis.  Intake/Output from previous day: 11/12 0701 - 11/13 0700 In: 3042.7 [P.O.:300; I.V.:2450.2; IV Piggyback:292.6] Out: -  Intake/Output this shift: No intake/output data recorded.  General appearance: alert, cooperative, no distress and Looks like he just still feels bad. Resp: clear to auscultation bilaterally GI: Soft, still tender on the left flank.  May be more tender than yesterday.  Lab Results:  Recent Labs    07/13/18 0409 07/14/18 0146  WBC 8.9 10.4  HGB 14.1 14.4  HCT 40.7 41.4  PLT 211 237    BMET Recent Labs    07/13/18 0409  NA 132*  K 3.6  CL 101  CO2 24  GLUCOSE 92  BUN 7  CREATININE 1.18  CALCIUM 8.1*   PT/INR No results for input(s): LABPROT, INR in the last 72 hours.  Recent Labs  Lab 07/10/18 1122  AST 20  ALT 20  ALKPHOS 50  BILITOT 0.7  PROT 6.5  ALBUMIN 3.3*     Lipase      Component Value Date/Time   LIPASE 31 07/10/2018 1122     Prior to Admission medications   Medication Sig Start Date End Date Taking? Authorizing Provider  acetaminophen (TYLENOL) 325 MG tablet Take 650 mg by mouth every 6 (six) hours as needed for fever.   Yes [provider]  ciprofloxacin (CIPRO) 500 MG tablet Take 500 mg by mouth 2 (two) times daily. 07/08/18 07/22/18 Yes [provider]    Medications: . docusate sodium  100 mg Oral BID  . enoxaparin (LOVENOX) injection  40 mg Subcutaneous Q24H  . famotidine  20 mg Oral QHS   . sodium chloride 50 mL/hr at 07/13/18 0858  . piperacillin-tazobactam (ZOSYN)  IV 3.375 g (07/14/18 0536)    Assessment/Plan  Acute sigmoid diverticulitis with microperforation.   FEN:  IV fluids/full ID: Zosyn 11/9 =>>day 4 DVT: Lovenox Follow-up: To be determined  Plan: I get a recheck his BMP, order CT scan with contrast, chest x-ray and urinalysis.  BMP shows creatinine 0.97, potassium of 3.1.  Will increase IV fluids were and replace potassium via IV fluids.  Also checking magnesium.    LOS: 4 days    Wynetta Seith 07/14/2018 [phone]

## 2018-07-14 NOTE — Progress Notes (Signed)
Reviewed CT scan results with wife, mother and father.  I told them we were asking IR to place a drain. We would continue antibiotics, the drain would be in until drainage was at a very low volume.  He  May go home with a drain, and this is a slow process.  Pt had not returned from CT when I talked with the family.    CT results: There has been interval worsening since the prior CT. New abscesses have developed extending from the perforation along the posterior margin of the inflamed sigmoid colon. Largest collection is in the left pelvis/left lower quadrant, measuring 15 x 6 x 12 cm. Inflammatory changes along the sigmoid colon are similar in severity. Most of the free intraperitoneal air has resorbed or organized into the new collections.  He has been seen by IR and they plan drain placement tomorrow.

## 2018-07-14 NOTE — H&P (Signed)
Chief Complaint: Abdominal fluid collection  Referring Physician(s): Romana Juniper  Supervising Physician: Arne Cleveland  Patient Status: [facility] - In-pt  History of Present Illness: Douglas Weber is a 47 y.o. male who developed some lower abdominal pain last Tuesday evening.    Last Wednesday it worsened and he had a fever.    He had some improvement in symptoms but the became worse so he came to the emergency department on 07/10/18.  He was evaluated with CT scan of the abdomen pelvis which demonstrated sigmoid diverticulitis with some scattered bubbles of free air.   He was admitted for bowel rest and IV Zosyn was started.  He started to get a little better but then today he reported difficulty eating and that had been diaphoretic.  He was still having left sided pain.  CT scan was repeated today which showed a large abscess measuring 15 x 6 x 12 cm.  We are asked to evaluate him for drain placement.   History reviewed. No pertinent past medical history.  History reviewed. No pertinent surgical history.  Allergies: Patient has no known allergies.  Medications: Prior to Admission medications   Medication Sig Start Date End Date Taking? Authorizing Provider  acetaminophen (TYLENOL) 325 MG tablet Take 650 mg by mouth every 6 (six) hours as needed for fever.   Yes [provider]  ciprofloxacin (CIPRO) 500 MG tablet Take 500 mg by mouth 2 (two) times daily. 07/08/18 07/22/18 Yes [provider]     History reviewed. No pertinent family history.  Social History   Socioeconomic History  . Marital status: Married    Spouse name: Not on file  . Number of children: Not on file  . Years of education: Not on file  . Highest education level: Not on file  Occupational History  . Not on file  Social Needs  . Financial resource strain: Not on file  . Food insecurity:    Worry: Not on file    Inability: Not on file  . Transportation needs:   Medical: Not on file    Non-medical: Not on file  Tobacco Use  . Smoking status: Current Some Day Smoker  . Smokeless tobacco: Never Used  Substance and Sexual Activity  . Alcohol use: Yes    Alcohol/week: 1.0 standard drinks    Types: 1 Glasses of wine per week  . Drug use: Yes    Types: Marijuana    Comment: unsure  . Sexual activity: Not on file  Lifestyle  . Physical activity:    Days per week: Not on file    Minutes per session: Not on file  . Stress: Not on file  Relationships  . Social connections:    Talks on phone: Not on file    Gets together: Not on file    Attends religious service: Not on file    Active member of club or organization: Not on file    Attends meetings of clubs or organizations: Not on file    Relationship status: Not on file  Other Topics Concern  . Not on file  Social History Narrative  . Not on file     Review of Systems: A 12 point ROS discussed and pertinent positives are indicated in the HPI above.  All other systems are negative.  Review of Systems  Vital Signs: BP 128/89 (BP Location: Right Arm)   Pulse 81   Temp 99 F (37.2 C) (Oral)   Resp 18  Ht 5\' 7"  (1.702 m)   Wt 78 kg   SpO2 97%   BMI 26.94 kg/m   Physical Exam  Constitutional: He is oriented to person, place, and time. He appears well-developed.  HENT:  Head: Normocephalic and atraumatic.  Eyes: EOM are normal.  Neck: Normal range of motion.  Cardiovascular: Normal rate and regular rhythm.  Pulmonary/Chest: Effort normal and breath sounds normal.  Abdominal: There is tenderness.  Musculoskeletal: Normal range of motion.  Neurological: He is alert and oriented to person, place, and time.  Skin: Skin is warm and dry.  Psychiatric: He has a normal mood and affect. His behavior is normal. Judgment and thought content normal.  Vitals reviewed.   Imaging: Dg Chest 2 View  Result Date: 07/14/2018 CLINICAL DATA:  Fever. EXAM: CHEST - 2 VIEW COMPARISON:  None.  FINDINGS: The heart size and mediastinal contours are within normal limits. No pneumothorax is noted. Mild left basilar atelectasis or infiltrate is noted with possible small pleural effusion. Minimal right lateral basilar subsegmental atelectasis is noted. The visualized skeletal structures are unremarkable. IMPRESSION: Mild left basilar atelectasis or infiltrate is noted with possible small pleural effusion. Minimal right basilar subsegmental atelectasis is noted laterally. Electronically Signed   By: Marijo Conception, M.D.   On: 07/14/2018 15:22   Ct Abdomen Pelvis W Contrast  Result Date: 07/14/2018 CLINICAL DATA:  Left flank pain with nausea and vomiting for 2 days. Sigmoid diverticulitis diagnosed on the prior exam. EXAM: CT ABDOMEN AND PELVIS WITH CONTRAST TECHNIQUE: Multidetector CT imaging of the abdomen and pelvis was performed using the standard protocol following bolus administration of intravenous contrast. CONTRAST:  130mL OMNIPAQUE IOHEXOL 300 MG/ML  SOLN COMPARISON:  07/10/2018. FINDINGS: Lower chest: Small, left greater than right, pleural effusions. Minor dependent lower lobe atelectasis, also greater on the left. Heart normal size. Hepatobiliary: No focal liver abnormality is seen. No gallstones, gallbladder wall thickening, or biliary dilatation. Pancreas: Unremarkable. No pancreatic ductal dilatation or surrounding inflammatory changes. Spleen: Normal in size without focal abnormality. Adrenals/Urinary Tract: No adrenal masses. Kidneys normal size, orientation and position with symmetric enhancement and excretion. Small stone noted in the lower pole of the left kidney. No renal masses. No hydronephrosis. Normal ureters. Normal bladder. Stomach/Bowel: There is thickening of the wall of the sigmoid colon where there are several diverticula. There is surrounding inflammation. A tract extends from the posterior aspect of the affected sigmoid colon to connect to a small collection that contains a  small amount of contrast and small bubbles of air. This extends laterally to connect to a large collection in the left lower quadrant and pelvis containing fluid and air. This collection measures 15 x 6 x 12 cm. There are additional small collections, in the anterior pelvis, anterior and slightly superior to the involved sigmoid colon, measuring 2.9 x 1.1 cm, as well as a thin collection, which extends along the lateral aspect the peritoneal cavity, measuring 12 mm in greatest thickness. These collections have developed since the prior CT. Previously seen free air has mostly resolved. No other areas of colonic inflammation. Small bowel lies adjacent to the pelvic and left lower quadrant collections. Mild fold thickening is evident with the bowel is in close approximation to the collections consistent with reactive edema. Small bowel is normal in caliber its entirety. No other wall fold thickening. Normal appendix visualized. Vascular/Lymphatic: No pathologically enlarged lymph nodes. Mild aortic atherosclerosis. Reproductive: Unremarkable. Other: No hernia. Trace free fluid most evident adjacent to the spleen.  Musculoskeletal: No acute or significant osseous findings. IMPRESSION: 1. There has been interval worsening since the prior CT. New abscesses have developed extending from the perforation along the posterior margin of the inflamed sigmoid colon. Largest collection is in the left pelvis/left lower quadrant, measuring 15 x 6 x 12 cm. Inflammatory changes along the sigmoid colon are similar in severity. Most of the free intraperitoneal air has resorbed or organized into the new collections. Electronically Signed   By: Lajean Manes M.D.   On: 07/14/2018 15:08   Ct Abdomen Pelvis W Contrast  Result Date: 07/10/2018 CLINICAL DATA:  47 year old male with generalize abdominal pain and nausea since 10 a.m. EXAM: CT ABDOMEN AND PELVIS WITH CONTRAST TECHNIQUE: Multidetector CT imaging of the abdomen and pelvis was  performed using the standard protocol following bolus administration of intravenous contrast. CONTRAST:  125mL OMNIPAQUE IOHEXOL 300 MG/ML  SOLN COMPARISON:  No priors. FINDINGS: Lower chest: There are no aggressive appearing lytic or blastic lesions noted in the visualized portions of the skeleton. Hepatobiliary: No suspicious cystic or solid hepatic lesions. No intra or extrahepatic biliary ductal dilatation. Gallbladder is normal in appearance. Pancreas: No pancreatic mass. No pancreatic ductal dilatation. No pancreatic or peripancreatic fluid or inflammatory changes. Spleen: Unremarkable. Adrenals/Urinary Tract: Bilateral kidneys and adrenal glands are normal in appearance. No hydroureteronephrosis. Urinary bladder is normal in appearance. Stomach/Bowel: Normal appearance of the stomach. No pathologic dilatation of small bowel or colon. Numerous colonic diverticulae are noted. In the region of the mid sigmoid colon there are extensive inflammatory changes adjacent to several diverticulae where there is also extensive mural thickening, and a small amount of extraluminal gas extending into the sigmoid mesocolon, compatible with an acute perforated diverticulitis. Appendix is not confidently identified may be surgically absent. Vascular/Lymphatic: Aortic atherosclerosis, without evidence of aneurysm or dissection in the abdominal or pelvic vasculature. Circumaortic left renal vein (normal anatomical variant) incidentally noted. No lymphadenopathy noted in the abdomen or pelvis. Reproductive: Prostate gland and seminal vesicles are unremarkable in appearance. Other: Large amount of pneumoperitoneum. Trace amount of fluid in the left pericolic gutter and in the low left hemipelvis. Musculoskeletal: There are no aggressive appearing lytic or blastic lesions noted in the visualized portions of the skeleton. IMPRESSION: 1. Perforated acute diverticulitis in the mid sigmoid colon. Emergent surgical consultation is  strongly recommended. 2. Aortic atherosclerosis. Critical Value/emergent results were called by telephone at the time of interpretation on 07/10/2018 at 1:43 pm to Dr. Aletta Edouard, who verbally acknowledged these results. Electronically Signed   By: Vinnie Langton M.D.   On: 07/10/2018 13:45    Labs:  CBC: Recent Labs    07/10/18 1122 07/11/18 0432 07/13/18 0409 07/14/18 0146  WBC 8.7 4.9 8.9 10.4  HGB 16.4 16.5 14.1 14.4  HCT 48.5 47.3 40.7 41.4  PLT 265 207 211 237    COAGS: No results for input(s): INR, APTT in the last 8760 hours.  BMP: Recent Labs    07/10/18 1122 07/11/18 0432 07/13/18 0409 07/14/18 0819  NA 137 134* 132* 133*  K 3.8 4.1 3.6 3.1*  CL 101 102 101 101  CO2 24 23 24 24   GLUCOSE 166* 151* 92 97  BUN 8 8 7 6   CALCIUM 8.9 8.4* 8.1* 7.8*  CREATININE <0.30* 1.23 1.18 0.97  GFRNONAA NOT CALCULATED >60 >60 >60  GFRAA NOT CALCULATED >60 >60 >60    LIVER FUNCTION TESTS: Recent Labs    07/10/18 1122  BILITOT 0.7  AST 20  ALT 20  ALKPHOS 50  PROT 6.5  ALBUMIN 3.3*    TUMOR MARKERS: No results for input(s): AFPTM, CEA, CA199, CHROMGRNA in the last 8760 hours.  Assessment and Plan:  Abdominal abscess/fluid collection secondary to perforated diverticulitis.  Images reviewed by Dr. Laurence Ferrari.  Will proceed with image guided drainage/drain placement tomorrow.  Hold Lovenox in am. NPO after MN. Will check INR.  Risks and benefits discussed with the patient including bleeding, infection, damage to adjacent structures, bowel perforation/fistula connection, and sepsis.  All of the patient's questions were answered, patient is agreeable to proceed. Consent signed and in chart.  Thank you for this interesting consult.  I greatly enjoyed meeting Ranelle Oyster and look forward to participating in their care.  A copy of this report was sent to the requesting provider on this date.  Electronically Signed: Murrell Redden, PA-C   07/14/2018,  4:10 PM      I spent a total of 40 Minutes  in face to face in clinical consultation, greater than 50% of which was counseling/coordinating care for CT drain abscess.

## 2018-07-14 NOTE — Plan of Care (Signed)
°  Problem: Education: °Goal: Knowledge of General Education information will improve °Description: Including pain rating scale, medication(s)/side effects and non-pharmacologic comfort measures °Outcome: Progressing °  °Problem: Clinical Measurements: °Goal: Ability to maintain clinical measurements within normal limits will improve °Outcome: Progressing °  °Problem: Nutrition: °Goal: Adequate nutrition will be maintained °Outcome: Progressing °  °

## 2018-07-14 NOTE — Plan of Care (Signed)

## 2018-07-15 ENCOUNTER — Inpatient Hospital Stay (HOSPITAL_COMMUNITY): Payer: BLUE CROSS/BLUE SHIELD

## 2018-07-15 LAB — BASIC METABOLIC PANEL
Anion gap: 9 (ref 5–15)
CHLORIDE: 103 mmol/L (ref 98–111)
CO2: 21 mmol/L — AB (ref 22–32)
Calcium: 7.8 mg/dL — ABNORMAL LOW (ref 8.9–10.3)
Creatinine, Ser: 1.03 mg/dL (ref 0.61–1.24)
GFR calc Af Amer: >60 mL/min (ref >60)
GLUCOSE: 116 mg/dL — AB (ref 70–99)
POTASSIUM: 3.2 mmol/L — AB (ref 3.5–5.1)
Sodium: 133 mmol/L — ABNORMAL LOW (ref 135–145)

## 2018-07-15 LAB — CBC
HEMATOCRIT: 43.4 % (ref 39.0–52.0)
Hemoglobin: 14.9 g/dL (ref 13.0–17.0)
MCH: 32.8 pg (ref 26.0–34.0)
MCHC: 34.3 g/dL (ref 30.0–36.0)
MCV: 95.6 fL (ref 80.0–100.0)
PLATELETS: 284 10*3/uL (ref 150–400)
RBC: 4.54 MIL/uL (ref 4.22–5.81)
RDW: 12.4 % (ref 11.5–15.5)
WBC: 10.4 10*3/uL (ref 4.0–10.5)
nRBC: 0 % (ref 0.0–0.2)

## 2018-07-15 LAB — PROTIME-INR
INR: 0.99
Prothrombin Time: 13 seconds (ref 11.4–15.2)

## 2018-07-15 MED ORDER — LIDOCAINE HCL 1 % IJ SOLN
INTRAMUSCULAR | Status: AC
  Filled 2018-07-15: qty 20

## 2018-07-15 MED ORDER — POTASSIUM CHLORIDE CRYS ER 20 MEQ PO TBCR
20.0000 meq | EXTENDED_RELEASE_TABLET | Freq: Two times a day (BID) | ORAL | Status: DC
  Administered 2018-07-15: 20 meq via ORAL
  Filled 2018-07-15 (×2): qty 1

## 2018-07-15 MED ORDER — MIDAZOLAM HCL 2 MG/2ML IJ SOLN
INTRAMUSCULAR | Status: AC | PRN
  Administered 2018-07-15: 0.5 mg via INTRAVENOUS
  Administered 2018-07-15: 1 mg via INTRAVENOUS
  Administered 2018-07-15: 0.5 mg via INTRAVENOUS

## 2018-07-15 MED ORDER — FENTANYL CITRATE (PF) 100 MCG/2ML IJ SOLN
INTRAMUSCULAR | Status: AC | PRN
  Administered 2018-07-15 (×2): 25 ug via INTRAVENOUS
  Administered 2018-07-15: 50 ug via INTRAVENOUS

## 2018-07-15 MED ORDER — MIDAZOLAM HCL 2 MG/2ML IJ SOLN
INTRAMUSCULAR | Status: AC
  Filled 2018-07-15: qty 4

## 2018-07-15 MED ORDER — FENTANYL CITRATE (PF) 100 MCG/2ML IJ SOLN
INTRAMUSCULAR | Status: AC
  Filled 2018-07-15: qty 4

## 2018-07-15 MED ORDER — SACCHAROMYCES BOULARDII 250 MG PO CAPS
250.0000 mg | ORAL_CAPSULE | Freq: Two times a day (BID) | ORAL | Status: DC
  Administered 2018-07-15 – 2018-07-18 (×7): 250 mg via ORAL
  Filled 2018-07-15 (×7): qty 1

## 2018-07-15 MED ORDER — SODIUM CHLORIDE 0.9% FLUSH
5.0000 mL | Freq: Three times a day (TID) | INTRAVENOUS | Status: DC
  Administered 2018-07-15 – 2018-07-18 (×9): 5 mL

## 2018-07-15 NOTE — Care Management Note (Signed)
Case Management Note  Patient Details  Name: Douglas Weber MRN: [id number] Date of Birth: 04-Feb-1971  Subjective/Objective:                    Action/Plan:  Will continue to follow for discharge needs Expected Discharge Date:                  Expected Discharge Plan:     In-House Referral:     Discharge planning Services     Post Acute Care Choice:    Choice offered to:     DME Arranged:    DME Agency:     HH Arranged:    [facility] Agency:     Status of Service:  In process, will continue to follow  If discussed at Long Length of Stay Meetings, dates discussed:    Additional Comments:  Douglas Favre, RN 07/15/2018, 10:35 AM

## 2018-07-15 NOTE — Procedures (Signed)
Abdominal abscess  S/p CT LLQ drain  Pus aspirated cx sent No comp Stable Full report in pacs

## 2018-07-15 NOTE — Plan of Care (Signed)
  Problem: Education: Goal: Knowledge of General Education information will improve Description: Including pain rating scale, medication(s)/side effects and non-pharmacologic comfort measures Outcome: Progressing   Problem: Activity: Goal: Risk for activity intolerance will decrease Outcome: Progressing   

## 2018-07-15 NOTE — Progress Notes (Signed)
    CC:  Abdominal pain  Subjective: He still feels bad.  Says same abdominal pain left flank, he is also hungry.  No acute changes on physical exam.  Objective: Vital signs in last 24 hours: Temp:  [98.9 F (37.2 C)-99.3 F (37.4 C)] 98.9 F (37.2 C) (11/14 0533) Pulse Rate:  [70-81] 70 (11/14 0533) Resp:  [17-18] 17 (11/14 0533) BP: (128-143)/(84-89) 143/86 (11/14 0533) SpO2:  [96 %-97 %] 96 % (11/14 0533) Last BM Date: 07/14/18 180 PO 1400 IV 125 urine BM x 1 Afebrile, TM 99.3, VSS K+ 3.2 WBC 10.4 CT 11.13:New abscesses have developed extending from the perforation along the posterior margin of the inflamed sigmoid colon. Largest collection is in the left pelvis/left lower quadrant, measuring 15 x 6 x 12 cm. Inflammatory changes along the sigmoid colon are similar in severity. Most of the free intraperitoneal air has resorbed or organized into the new collections.  Intake/Output from previous day: 11/13 0701 - 11/14 0700 In: 1655 [P.O.:180; I.V.:1200; IV Piggyback:275] Out: 125 [Urine:125] Intake/Output this shift: No intake/output data recorded.  General appearance: alert, cooperative and no distress Resp: clear to auscultation bilaterally GI: Still tender on the left side.  No peritonitis.  Lab Results:  Recent Labs    07/14/18 0146 07/15/18 0227  WBC 10.4 10.4  HGB 14.4 14.9  HCT 41.4 43.4  PLT 237 284    BMET Recent Labs    07/14/18 0819 07/15/18 0227  NA 133* 133*  K 3.1* 3.2*  CL 101 103  CO2 24 21*  GLUCOSE 97 116*  BUN 6 <5*  CREATININE 0.97 1.03  CALCIUM 7.8* 7.8*   PT/INR Recent Labs    07/15/18 0227  LABPROT 13.0  INR 0.99    Recent Labs  Lab 07/10/18 1122  AST 20  ALT 20  ALKPHOS 50  BILITOT 0.7  PROT 6.5  ALBUMIN 3.3*     Lipase     Component Value Date/Time   LIPASE 31 07/10/2018 1122     Medications: . docusate sodium  100 mg Oral BID  . [START ON 07/16/2018] enoxaparin (LOVENOX) injection  40 mg  Subcutaneous Q24H  . famotidine  20 mg Oral QHS   . dextrose 5 % and 0.9 % NaCl with KCl 40 mEq/L 75 mL/hr at 07/14/18 1137  . piperacillin-tazobactam (ZOSYN)  IV 3.375 g (07/15/18 0532)   Anti-infectives (From admission, onward)   Start     Dose/Rate Route Frequency Ordered Stop   07/10/18 2230  piperacillin-tazobactam (ZOSYN) IVPB 3.375 g     3.375 g 12.5 mL/hr over 240 Minutes Intravenous Every 8 hours 07/10/18 1454     07/10/18 1400  piperacillin-tazobactam (ZOSYN) IVPB 3.375 g     3.375 g 100 mL/hr over 30 Minutes Intravenous  Once 07/10/18 1348 07/10/18 1457     Assessment/Plan Hypokalemia - replace K  Acutesigmoid diverticulitis with microperforation. -  Fever, sweats, WBC 10.4  =>> CT 11/13 - large collection in the left lower quadrant      and pelvis containing fluid and air. This collection measures 15 x 6 x 12 cm.   - IR drain planned 11/14   FEN: IV fluids/NPO XT:GGYIR 11/9 =>>day 5 DVT: Lovenox Follow-up: To be determined  Plan: Scheduled for IR drain placement today.  Continue antibiotics.       LOS: 5 days    Pinky Ravan 07/15/2018 [phone]

## 2018-07-16 LAB — BASIC METABOLIC PANEL
ANION GAP: 9 (ref 5–15)
CO2: 24 mmol/L (ref 22–32)
Calcium: 7.9 mg/dL — ABNORMAL LOW (ref 8.9–10.3)
Chloride: 101 mmol/L (ref 98–111)
Creatinine, Ser: 1.05 mg/dL (ref 0.61–1.24)
GFR calc Af Amer: >60 mL/min (ref >60)
Glucose, Bld: 101 mg/dL — ABNORMAL HIGH (ref 70–99)
POTASSIUM: 3.2 mmol/L — AB (ref 3.5–5.1)
SODIUM: 134 mmol/L — AB (ref 135–145)

## 2018-07-16 LAB — CBC
HCT: 42.2 % (ref 39.0–52.0)
Hemoglobin: 14.3 g/dL (ref 13.0–17.0)
MCH: 32.4 pg (ref 26.0–34.0)
MCHC: 33.9 g/dL (ref 30.0–36.0)
MCV: 95.5 fL (ref 80.0–100.0)
NRBC: 0 % (ref 0.0–0.2)
PLATELETS: 341 10*3/uL (ref 150–400)
RBC: 4.42 MIL/uL (ref 4.22–5.81)
RDW: 12.2 % (ref 11.5–15.5)
WBC: 9.3 10*3/uL (ref 4.0–10.5)

## 2018-07-16 LAB — MAGNESIUM: MAGNESIUM: 1.9 mg/dL (ref 1.7–2.4)

## 2018-07-16 MED ORDER — POTASSIUM CHLORIDE CRYS ER 20 MEQ PO TBCR
20.0000 meq | EXTENDED_RELEASE_TABLET | Freq: Three times a day (TID) | ORAL | Status: DC
  Administered 2018-07-16 (×2): 20 meq via ORAL
  Filled 2018-07-16 (×2): qty 1

## 2018-07-16 NOTE — Plan of Care (Signed)
  Problem: Activity: Goal: Risk for activity intolerance will decrease Outcome: Progressing   

## 2018-07-16 NOTE — Progress Notes (Signed)
    PQ:[id number] pain   Subjective: Doing much better, pain is better, pt and family amazed at the fluid coming out.  Asking when he can go home.    Objective: Vital signs in last 24 hours: Temp:  [98.2 F (36.8 C)-99.4 F (37.4 C)] 99.2 F (37.3 C) (11/15 0516) Pulse Rate:  [68-73] 73 (11/15 0516) Resp:  [16-26] 16 (11/15 0516) BP: (122-143)/(74-91) 143/90 (11/15 0516) SpO2:  [95 %-100 %] 96 % (11/15 0516) Last BM Date: 07/14/18 540 PO ? IV held most of day 518 IV Urine x 6 IR drain 315 Afebrile, VSS TM 99.4 BP up some K+ 3.2/Mag 1.9 WBC still normal 9.3   Intake/Output from previous day: 11/14 0701 - 11/15 0700 In: 1115.2 [P.O.:540; I.V.:518.1; IV Piggyback:47.1] Out: 315 [Drains:315] Intake/Output this shift: No intake/output data recorded.  General appearance: alert, cooperative and no distress Resp: clear to auscultation bilaterally GI: Positive bowel sounds, left flank pain is much better.  Drainage is a thin purulent fluid, whitish-brown in color.  Lab Results:  Recent Labs    07/15/18 0227 07/16/18 0315  WBC 10.4 9.3  HGB 14.9 14.3  HCT 43.4 42.2  PLT 284 341    BMET Recent Labs    07/15/18 0227 07/16/18 0315  NA 133* 134*  K 3.2* 3.2*  CL 103 101  CO2 21* 24  GLUCOSE 116* 101*  BUN <5* <5*  CREATININE 1.03 1.05  CALCIUM 7.8* 7.9*   PT/INR Recent Labs    07/15/18 0227  LABPROT 13.0  INR 0.99    Recent Labs  Lab 07/10/18 1122  AST 20  ALT 20  ALKPHOS 50  BILITOT 0.7  PROT 6.5  ALBUMIN 3.3*     Lipase     Component Value Date/Time   LIPASE 31 07/10/2018 1122     Medications: . docusate sodium  100 mg Oral BID  . enoxaparin (LOVENOX) injection  40 mg Subcutaneous Q24H  . famotidine  20 mg Oral QHS  . potassium chloride  20 mEq Oral BID  . saccharomyces boulardii  250 mg Oral BID  . sodium chloride flush  5 mL Intracatheter Q8H    Assessment/Plan  Hypokalemia - replace K  Acutesigmoid diverticulitis with  microperforation. -  Fever, sweats, WBC 10.4  =>> CT 11/13 - large collection in the left lower quadrant      and pelvis containing fluid and air. This collection measures 15 x 6 x 12 cm.   - IR drain planned 11/14   - 315 cc white brown purulent fluid day 1 recorded   FEN: IV fluids/NPO JF:HLKTG 11/9 =>>day 6 DVT: Lovenox Follow-up: Dr. Kae Heller  Plan: Continue drain, IV antibiotics, start advancing his diet. Case manager to see and help with drain at home when he is ready to go.   LOS: 6 days    Tajai Ihde 07/16/2018 [phone]

## 2018-07-16 NOTE — Care Management Note (Addendum)
Case Management Note  Patient Details  Name: Douglas Weber MRN: [id number] Date of Birth: Apr 02, 1971  Subjective/Objective:                    Action/Plan:  Discussed discharge planning with patient and his mother at bedside. Explained bedside nurse will teach patient and wife how to care for drain flush and record and clean site. Home health nurse will not make daily visits. Patient and mother voiced understanding . Mother states wife is willing to learn how to flush and care for drain.   Spoke with [name] Dr Kae Heller will follow patient.   Patient would like [facility] referral given to and accepted by [name] with [name].  Discussed with patient need of PCP and how to get a PCP. He can call number on insurance card and be provided with a full list of MD in network. If he knows of a MD he would like to establish care with he can call that MD office directly to see if they are accepting new patient's with his insurance. Patient and mother voiced understanding. Patient states he will establish care with a PCP.  Expected Discharge Date:                  Expected Discharge Plan:  [place]  In-House Referral:     Discharge planning Services  CM Consult  Post Acute Care Choice:  Home Health Choice offered to:  Patient, Parent  DME Arranged:  N/A DME Agency:  NA  HH Arranged:    [place] Agency:     Status of Service:  In process, will continue to follow  If discussed at Long Length of Stay Meetings, dates discussed:    Additional Comments:  Marilu Favre, RN 07/16/2018, 11:01 AM

## 2018-07-16 NOTE — Progress Notes (Signed)
Referring Physician(s): Dr Georgiann Cocker  Supervising Physician: Markus Daft  Patient Status:  [facility] - In-pt  Chief Complaint:  Diverticular abscess  Subjective:  LLQ abscess drain placed in IR 11/14 Feels better already Slept well Less pain  Allergies: Patient has no known allergies.  Medications: Prior to Admission medications   Medication Sig Start Date End Date Taking? Authorizing Provider  acetaminophen (TYLENOL) 325 MG tablet Take 650 mg by mouth every 6 (six) hours as needed for fever.   Yes [provider]  ciprofloxacin (CIPRO) 500 MG tablet Take 500 mg by mouth 2 (two) times daily. 07/08/18 07/22/18 Yes [provider]     Vital Signs: BP (!) 143/90 (BP Location: Right Arm)   Pulse 73   Temp 99.2 F (37.3 C) (Oral)   Resp 16   Ht 5\' 7"  (1.702 m)   Wt 172 lb (78 kg)   SpO2 96%   BMI 26.94 kg/m   Physical Exam  Abdominal: Soft. Normal appearance. There is tenderness.  Skin: Skin is warm and dry.  Site is clean and dry NT no bleeding OP milky yellow OP 315 cc yesterday-- minimal this am in JP Low grade temp  Vitals reviewed.   Imaging: Dg Chest 2 View  Result Date: 07/14/2018 CLINICAL DATA:  Fever. EXAM: CHEST - 2 VIEW COMPARISON:  None. FINDINGS: The heart size and mediastinal contours are within normal limits. No pneumothorax is noted. Mild left basilar atelectasis or infiltrate is noted with possible small pleural effusion. Minimal right lateral basilar subsegmental atelectasis is noted. The visualized skeletal structures are unremarkable. IMPRESSION: Mild left basilar atelectasis or infiltrate is noted with possible small pleural effusion. Minimal right basilar subsegmental atelectasis is noted laterally. Electronically Signed   By: Marijo Conception, M.D.   On: 07/14/2018 15:22   Ct Abdomen Pelvis W Contrast  Result Date: 07/14/2018 CLINICAL DATA:  Left flank pain with nausea and vomiting for 2 days. Sigmoid diverticulitis  diagnosed on the prior exam. EXAM: CT ABDOMEN AND PELVIS WITH CONTRAST TECHNIQUE: Multidetector CT imaging of the abdomen and pelvis was performed using the standard protocol following bolus administration of intravenous contrast. CONTRAST:  183mL OMNIPAQUE IOHEXOL 300 MG/ML  SOLN COMPARISON:  07/10/2018. FINDINGS: Lower chest: Small, left greater than right, pleural effusions. Minor dependent lower lobe atelectasis, also greater on the left. Heart normal size. Hepatobiliary: No focal liver abnormality is seen. No gallstones, gallbladder wall thickening, or biliary dilatation. Pancreas: Unremarkable. No pancreatic ductal dilatation or surrounding inflammatory changes. Spleen: Normal in size without focal abnormality. Adrenals/Urinary Tract: No adrenal masses. Kidneys normal size, orientation and position with symmetric enhancement and excretion. Small stone noted in the lower pole of the left kidney. No renal masses. No hydronephrosis. Normal ureters. Normal bladder. Stomach/Bowel: There is thickening of the wall of the sigmoid colon where there are several diverticula. There is surrounding inflammation. A tract extends from the posterior aspect of the affected sigmoid colon to connect to a small collection that contains a small amount of contrast and small bubbles of air. This extends laterally to connect to a large collection in the left lower quadrant and pelvis containing fluid and air. This collection measures 15 x 6 x 12 cm. There are additional small collections, in the anterior pelvis, anterior and slightly superior to the involved sigmoid colon, measuring 2.9 x 1.1 cm, as well as a thin collection, which extends along the lateral aspect the peritoneal cavity, measuring 12 mm in greatest thickness. These collections  have developed since the prior CT. Previously seen free air has mostly resolved. No other areas of colonic inflammation. Small bowel lies adjacent to the pelvic and left lower quadrant  collections. Mild fold thickening is evident with the bowel is in close approximation to the collections consistent with reactive edema. Small bowel is normal in caliber its entirety. No other wall fold thickening. Normal appendix visualized. Vascular/Lymphatic: No pathologically enlarged lymph nodes. Mild aortic atherosclerosis. Reproductive: Unremarkable. Other: No hernia. Trace free fluid most evident adjacent to the spleen. Musculoskeletal: No acute or significant osseous findings. IMPRESSION: 1. There has been interval worsening since the prior CT. New abscesses have developed extending from the perforation along the posterior margin of the inflamed sigmoid colon. Largest collection is in the left pelvis/left lower quadrant, measuring 15 x 6 x 12 cm. Inflammatory changes along the sigmoid colon are similar in severity. Most of the free intraperitoneal air has resorbed or organized into the new collections. Electronically Signed   By: Lajean Manes M.D.   On: 07/14/2018 15:08   Ct Image Guided Drainage By Percutaneous Catheter  Result Date: 07/15/2018 INDICATION: Enlarging left lower quadrant abdominal abscess EXAM: CT GUIDED DRAINAGE OF LEFT LOWER QUADRANT ABDOMINAL ABSCESS MEDICATIONS: The patient is currently admitted to the hospital and receiving intravenous antibiotics. The antibiotics were administered within an appropriate time frame prior to the initiation of the procedure. ANESTHESIA/SEDATION: 2.0 mg IV Versed 100 mcg IV Fentanyl Moderate Sedation Time:  16 MINUTES The patient was continuously monitored during the procedure by the interventional radiology nurse under my direct supervision. COMPLICATIONS: None immediate. TECHNIQUE: Informed written consent was obtained from the patient after a thorough discussion of the procedural risks, benefits and alternatives. All questions were addressed. Maximal Sterile Barrier Technique was utilized including caps, mask, sterile gowns, sterile gloves, sterile  drape, hand hygiene and skin antiseptic. A timeout was performed prior to the initiation of the procedure. PROCEDURE: Previous imaging reviewed. Patient positioned supine. Noncontrast localization CT performed. The large left lower quadrant abdominal abscess was localized. Overlying skin marked for an anterior oblique approach. Under sterile conditions and local anesthesia, a 18 gauge 10 cm access needle was advanced percutaneously under CT guidance into the fluid collection. Needle position confirmed with CT. Syringe aspiration yielded purulent fluid. Sample sent for culture. Guidewire inserted followed by tract dilatation to insert a 12 [place] drain. Drain catheter position confirmed with CT. Aspiration yielded 100 cc of purulent fluid. Catheter secured with Prolene suture and connected to external suction bulb. Sterile dressing applied. No immediate complication. FINDINGS: Imaging confirms needle placement into the left lower quadrant abscess for drain placement. IMPRESSION: Successful CT-guided left lower quadrant abscess drain insertion Electronically Signed   By: Jerilynn Mages.  Shick M.D.   On: 07/15/2018 12:44    Labs:  CBC: Recent Labs    07/13/18 0409 07/14/18 0146 07/15/18 0227 07/16/18 0315  WBC 8.9 10.4 10.4 9.3  HGB 14.1 14.4 14.9 14.3  HCT 40.7 41.4 43.4 42.2  PLT 211 237 284 341    COAGS: Recent Labs    07/15/18 0227  INR 0.99    BMP: Recent Labs    07/13/18 0409 07/14/18 0819 07/15/18 0227 07/16/18 0315  NA 132* 133* 133* 134*  K 3.6 3.1* 3.2* 3.2*  CL 101 101 103 101  CO2 24 24 21* 24  GLUCOSE 92 97 116* 101*  BUN 7 6 <5* <5*  CALCIUM 8.1* 7.8* 7.8* 7.9*  CREATININE 1.18 0.97 1.03 1.05  GFRNONAA >60 >60 >60 >  60  GFRAA >60 >60 >60 >60    LIVER FUNCTION TESTS: Recent Labs    07/10/18 1122  BILITOT 0.7  AST 20  ALT 20  ALKPHOS 50  PROT 6.5  ALBUMIN 3.3*    Assessment and Plan:  LLQ abscess drain intact Will follow If home with drain-- will need flush  5-10 cc daily Record OP Follow in IR OP Clinic  Electronically Signed: Victorino Fatzinger A, PA-C 07/16/2018, 9:30 AM   I spent a total of 15 Minutes at the the patient's bedside AND on the patient's hospital floor or unit, greater than 50% of which was counseling/coordinating care for LLQ abscess drain

## 2018-07-17 ENCOUNTER — Other Ambulatory Visit: Payer: Self-pay | Admitting: Radiology

## 2018-07-17 DIAGNOSIS — E876 Hypokalemia: Secondary | ICD-10-CM

## 2018-07-17 DIAGNOSIS — K5720 Diverticulitis of large intestine with perforation and abscess without bleeding: Secondary | ICD-10-CM

## 2018-07-17 DIAGNOSIS — K651 Peritoneal abscess: Secondary | ICD-10-CM

## 2018-07-17 LAB — CBC
HCT: 40.3 % (ref 39.0–52.0)
Hemoglobin: 13.8 g/dL (ref 13.0–17.0)
MCH: 32.6 pg (ref 26.0–34.0)
MCHC: 34.2 g/dL (ref 30.0–36.0)
MCV: 95.3 fL (ref 80.0–100.0)
NRBC: 0 % (ref 0.0–0.2)
Platelets: 407 10*3/uL — ABNORMAL HIGH (ref 150–400)
RBC: 4.23 MIL/uL (ref 4.22–5.81)
RDW: 12.1 % (ref 11.5–15.5)
WBC: 9.3 10*3/uL (ref 4.0–10.5)

## 2018-07-17 LAB — MAGNESIUM: Magnesium: 1.9 mg/dL (ref 1.7–2.4)

## 2018-07-17 LAB — BASIC METABOLIC PANEL
Anion gap: 8 (ref 5–15)
BUN: <5 mg/dL — ABNORMAL LOW (ref 6–20)
CO2: 22 mmol/L (ref 22–32)
CREATININE: 0.88 mg/dL (ref 0.61–1.24)
Calcium: 7.9 mg/dL — ABNORMAL LOW (ref 8.9–10.3)
Chloride: 104 mmol/L (ref 98–111)
GFR calc non Af Amer: >60 mL/min (ref >60)
GLUCOSE: 98 mg/dL (ref 70–99)
Potassium: 3.2 mmol/L — ABNORMAL LOW (ref 3.5–5.1)
Sodium: 134 mmol/L — ABNORMAL LOW (ref 135–145)

## 2018-07-17 MED ORDER — SODIUM CHLORIDE 0.9% FLUSH: 3.0000 mL | INTRAVENOUS | Status: DC | PRN

## 2018-07-17 MED ORDER — MAGIC MOUTHWASH: 15.0000 mL | Freq: Four times a day (QID) | ORAL | Status: DC | PRN

## 2018-07-17 MED ORDER — LIP MEDEX EX OINT
1.0000 "application " | TOPICAL_OINTMENT | Freq: Two times a day (BID) | CUTANEOUS | Status: DC
  Administered 2018-07-17 (×2): 1 via TOPICAL
  Filled 2018-07-17: qty 7

## 2018-07-17 MED ORDER — SODIUM CHLORIDE 0.9 % IV SOLN: 250.0000 mL | INTRAVENOUS | Status: DC | PRN

## 2018-07-17 MED ORDER — LACTATED RINGERS IV BOLUS: 1000.0000 mL | Freq: Three times a day (TID) | INTRAVENOUS | Status: DC | PRN

## 2018-07-17 MED ORDER — GUAIFENESIN-DM 100-10 MG/5ML PO SYRP: 10.0000 mL | ORAL_SOLUTION | ORAL | Status: DC | PRN

## 2018-07-17 MED ORDER — HYDROCORTISONE 1 % EX CREA: 1.0000 "application " | TOPICAL_CREAM | Freq: Three times a day (TID) | CUTANEOUS | Status: DC | PRN

## 2018-07-17 MED ORDER — ALUM & MAG HYDROXIDE-SIMETH 200-200-20 MG/5ML PO SUSP: 30.0000 mL | Freq: Four times a day (QID) | ORAL | Status: DC | PRN

## 2018-07-17 MED ORDER — MENTHOL 3 MG MT LOZG: 1.0000 | LOZENGE | OROMUCOSAL | Status: DC | PRN

## 2018-07-17 MED ORDER — POTASSIUM CHLORIDE CRYS ER 20 MEQ PO TBCR
40.0000 meq | EXTENDED_RELEASE_TABLET | Freq: Two times a day (BID) | ORAL | Status: DC
  Administered 2018-07-17 – 2018-07-18 (×3): 40 meq via ORAL
  Filled 2018-07-17 (×3): qty 2

## 2018-07-17 MED ORDER — PHENOL 1.4 % MT LIQD: 1.0000 | OROMUCOSAL | Status: DC | PRN

## 2018-07-17 MED ORDER — GABAPENTIN 300 MG PO CAPS
300.0000 mg | ORAL_CAPSULE | Freq: Every day | ORAL | Status: DC
  Administered 2018-07-17: 300 mg via ORAL
  Filled 2018-07-17: qty 1

## 2018-07-17 MED ORDER — PSYLLIUM 95 % PO PACK
1.0000 | PACK | Freq: Every day | ORAL | Status: DC
  Administered 2018-07-17: 1 via ORAL
  Filled 2018-07-17 (×2): qty 1

## 2018-07-17 MED ORDER — SODIUM CHLORIDE 0.9% FLUSH
3.0000 mL | Freq: Two times a day (BID) | INTRAVENOUS | Status: DC
  Administered 2018-07-17 (×2): 3 mL via INTRAVENOUS

## 2018-07-17 MED ORDER — BISMUTH SUBSALICYLATE 262 MG/15ML PO SUSP: 30.0000 mL | Freq: Three times a day (TID) | ORAL | Status: DC | PRN

## 2018-07-17 MED ORDER — HYDROCORTISONE 2.5 % RE CREA: 1.0000 "application " | TOPICAL_CREAM | Freq: Four times a day (QID) | RECTAL | Status: DC | PRN

## 2018-07-17 NOTE — Progress Notes (Signed)
Douglas Weber [id number] 1970/10/31  CARE TEAM:  PCP: Patient, No Pcp Per  Outpatient Care Team: Patient Care Team: Patient, No Pcp Per as PCP - General (General Practice)  Inpatient Treatment Team: Treatment Team: Attending Provider: Edison Pace, Md, MD; Rounding Team: Edison Pace, Md, MD; Registered Nurse: Candida Peeling, RN; Technician: Rowe Robert, [place]; Rounding Team: [facility], MD; Registered Nurse: Kennith Center, RN; Technician: Gasper Sells, [place]; Registered Nurse: Candice Camp, RN (Inactive)   Problem List:   Active Problems:   Perforation of sigmoid colon due to diverticulitis      * No surgery found [facility] Stay = 7 days  Assessment  Gradually improving status post drainage of large abscess related to perforated sigmoid diverticulitis.  Plan:  Advance to solid diet.  Drain care.  Becoming more thin and lower volume an encouraging sign.  Most likely would benefit from follow-up drain clinic study to rule out chronic fistula.  FEN: Stop IV fluids.  PRN back-up.  Advance diet. Hypokalemia-Aggressively replace potassium.  Check magnesium. WE:XHBZJ 11/9 =>>day6 DVT: Lovenox, SCDs, etc mobilize as tolerated to help recovery  Follow-up: Dr. Elicia Lamp surgery to discuss elective resection in 6-8 weeks once this attack is under control.  Given his young age and severe complex attack, I worry about his risk of recurrent diverticulitis in the future.  They will discuss with Dr. Arnoldo Morale group about that.  Most likely patient would benefit from colonoscopy prior to surgery to rule out any other etiologies including possibility this is a cancer.  30 minutes spent in review, evaluation, examination, counseling, and coordination of care.  More than 50% of that time was spent in counseling.  I updated the patient's status to the patient and family.  Recommendations were made.  Questions were answered.  They expressed  understanding & appreciation.   07/17/2018    Subjective: (Chief complaint)  Less sore.  Walking hallways.  Mother at bedside.  Having loose diarrhea.  Drainage output seems less feculent  Objective:  Vital signs:  Vitals:   07/16/18 0516 07/16/18 1258 07/16/18 2115 07/17/18 0543  BP: (!) 143/90 129/84 129/87 (!) 137/95  Pulse: 73 78 70 71  Resp: '16 16 18 18  '$ Temp: 99.2 F (37.3 C) 99.1 F (37.3 C) 98.9 F (37.2 C) 98.7 F (37.1 C)  TempSrc: Oral Oral Oral Oral  SpO2: 96% 100% 95% 98%  Weight:      Height:        Last BM Date: 07/15/18  Intake/Output   Yesterday:  11/15 0701 - 11/16 0700 In: 488.8 [P.O.:340; IV Piggyback:133.8] Out: 86 [Drains:86] This shift:  No intake/output data recorded.  Bowel function:  Flatus: YES  BM:  YES  Drain: Clear and thin.  Slight green tinge.   Physical Exam:  General: Pt awake/alert/oriented x4 in no acute distress Eyes: PERRL, normal EOM.  Sclera clear.  No icterus Neuro: CN II-XII intact w/o focal sensory/motor deficits. Lymph: No head/neck/groin lymphadenopathy Psych:  No delerium/psychosis/paranoia HENT: Normocephalic, Mucus membranes moist.  No thrush Neck: Supple, No tracheal deviation Chest: No chest wall pain w good excursion CV:  Pulses intact.  Regular rhythm MS: Normal AROM mjr joints.  No obvious deformity  Abdomen: Soft.  Mildy distended.  Tenderness at LLQ mild.  No evidence of peritonitis.  No incarcerated hernias.  Ext:  No deformity.  No mjr edema.  No cyanosis Skin: No petechiae / purpura  Results:   Labs: Results  for orders placed or performed during the hospital encounter of 07/10/18 (from the past 48 hour(s))  Aerobic/Anaerobic Culture (surgical/deep wound)     Status: None (Preliminary result)   Collection Time: 07/15/18 12:07 PM  Result Value Ref Range   Specimen Description ABSCESS ABDOMEN    Special Requests NONE    Gram Stain      ABUNDANT WBC PRESENT, PREDOMINANTLY PMN NO  ORGANISMS SEEN    Culture      CULTURE REINCUBATED FOR BETTER GROWTH Performed at [facility], [place] [address], [place], [place] [address]    Report Status PENDING   CBC     Status: None   Collection Time: 07/16/18  3:15 AM  Result Value Ref Range   WBC 9.3 4.0 - 10.5 K/uL   RBC 4.42 4.22 - 5.81 MIL/uL   Hemoglobin 14.3 13.0 - 17.0 g/dL   HCT 42.2 39.0 - 52.0 %   MCV 95.5 80.0 - 100.0 fL   MCH 32.4 26.0 - 34.0 pg   MCHC 33.9 30.0 - 36.0 g/dL   RDW 12.2 11.5 - 15.5 %   Platelets 341 150 - 400 K/uL   nRBC 0.0 0.0 - 0.2 %    Comment: Performed at [facility], [place] [address], [place], [place] [address]  Basic metabolic panel     Status: Abnormal   Collection Time: 07/16/18  3:15 AM  Result Value Ref Range   Sodium 134 (L) 135 - 145 mmol/L   Potassium 3.2 (L) 3.5 - 5.1 mmol/L   Chloride 101 98 - 111 mmol/L   CO2 24 22 - 32 mmol/L   Glucose, Bld 101 (H) 70 - 99 mg/dL   BUN <5 (L) 6 - 20 mg/dL   Creatinine, Ser 1.05 0.61 - 1.24 mg/dL   Calcium 7.9 (L) 8.9 - 10.3 mg/dL   GFR calc non Af Amer >60 >60 mL/min   GFR calc Af Amer >60 >60 mL/min    Comment: (NOTE) The eGFR has been calculated using the CKD EPI equation. This calculation has not been validated in all clinical situations. eGFR's persistently <60 mL/min signify possible Chronic Kidney Disease.    Anion gap 9 5 - 15    Comment: Performed at [facility] [address], [place], [place] [address]  Magnesium     Status: None   Collection Time: 07/16/18  3:15 AM  Result Value Ref Range   Magnesium 1.9 1.7 - 2.4 mg/dL    Comment: Performed at [facility] [address] [place], [place] [address]  Basic metabolic panel     Status: Abnormal   Collection Time: 07/17/18  5:08 AM  Result Value Ref Range   Sodium 134 (L) 135 - 145 mmol/L   Potassium 3.2 (L) 3.5 - 5.1 mmol/L   Chloride 104 98 - 111 mmol/L   CO2 22 22 - 32 mmol/L   Glucose, Bld 98 70 - 99 mg/dL   BUN <5 (L) 6 - 20 mg/dL    Creatinine, Ser 0.88 0.61 - 1.24 mg/dL   Calcium 7.9 (L) 8.9 - 10.3 mg/dL   GFR calc non Af Amer >60 >60 mL/min   GFR calc Af Amer >60 >60 mL/min    Comment: (NOTE) The eGFR has been calculated using the CKD EPI equation. This calculation has not been validated in all clinical situations. eGFR's persistently <60 mL/min signify possible Chronic Kidney Disease.    Anion gap 8 5 - 15  Comment: Performed at [facility], [place] [address], [place], [place] [address]  CBC     Status: Abnormal   Collection Time: 07/17/18  5:08 AM  Result Value Ref Range   WBC 9.3 4.0 - 10.5 K/uL   RBC 4.23 4.22 - 5.81 MIL/uL   Hemoglobin 13.8 13.0 - 17.0 g/dL   HCT 40.3 39.0 - 52.0 %   MCV 95.3 80.0 - 100.0 fL   MCH 32.6 26.0 - 34.0 pg   MCHC 34.2 30.0 - 36.0 g/dL   RDW 12.1 11.5 - 15.5 %   Platelets 407 (H) 150 - 400 K/uL   nRBC 0.0 0.0 - 0.2 %    Comment: Performed at [facility], [place] [address], [place], [place] [address]    Imaging / Studies: Ct Image Guided Drainage By Percutaneous Catheter  Result Date: 07/15/2018 INDICATION: Enlarging left lower quadrant abdominal abscess EXAM: CT GUIDED DRAINAGE OF LEFT LOWER QUADRANT ABDOMINAL ABSCESS MEDICATIONS: The patient is currently admitted to the hospital and receiving intravenous antibiotics. The antibiotics were administered within an appropriate time frame prior to the initiation of the procedure. ANESTHESIA/SEDATION: 2.0 mg IV Versed 100 mcg IV Fentanyl Moderate Sedation Time:  16 MINUTES The patient was continuously monitored during the procedure by the interventional radiology nurse under my direct supervision. COMPLICATIONS: None immediate. TECHNIQUE: Informed written consent was obtained from the patient after a thorough discussion of the procedural risks, benefits and alternatives. All questions were addressed. Maximal Sterile Barrier Technique was utilized including caps, mask, sterile gowns, sterile gloves, sterile drape, hand hygiene  and skin antiseptic. A timeout was performed prior to the initiation of the procedure. PROCEDURE: Previous imaging reviewed. Patient positioned supine. Noncontrast localization CT performed. The large left lower quadrant abdominal abscess was localized. Overlying skin marked for an anterior oblique approach. Under sterile conditions and local anesthesia, a 18 gauge 10 cm access needle was advanced percutaneously under CT guidance into the fluid collection. Needle position confirmed with CT. Syringe aspiration yielded purulent fluid. Sample sent for culture. Guidewire inserted followed by tract dilatation to insert a 12 [place] drain. Drain catheter position confirmed with CT. Aspiration yielded 100 cc of purulent fluid. Catheter secured with Prolene suture and connected to external suction bulb. Sterile dressing applied. No immediate complication. FINDINGS: Imaging confirms needle placement into the left lower quadrant abscess for drain placement. IMPRESSION: Successful CT-guided left lower quadrant abscess drain insertion Electronically Signed   By: Jerilynn Mages.  Shick M.D.   On: 07/15/2018 12:44    Medications / Allergies: per chart  Antibiotics: Anti-infectives (From admission, onward)   Start     Dose/Rate Route Frequency Ordered Stop   07/10/18 2230  piperacillin-tazobactam (ZOSYN) IVPB 3.375 g     3.375 g 12.5 mL/hr over 240 Minutes Intravenous Every 8 hours 07/10/18 1454     07/10/18 1400  piperacillin-tazobactam (ZOSYN) IVPB 3.375 g     3.375 g 100 mL/hr over 30 Minutes Intravenous  Once 07/10/18 1348 07/10/18 1457        Note: Portions of this report may have been transcribed using voice recognition software. Every effort was made to ensure accuracy; however, inadvertent computerized transcription errors may be present.   Any transcriptional errors that result from this process are unintentional.     Adin Hector, MD, FACS, MASCRS Gastrointestinal and Minimally Invasive Surgery    [address] [place], [place] [address] [phone] Main / Paging [phone] Fax

## 2018-07-17 NOTE — Progress Notes (Signed)
Referring Physician(s): Dr Georgiann Cocker  Supervising Physician: Markus Daft  Patient Status:  [facility] - In-pt  Chief Complaint:  Diverticular abscess  Subjective:  LLQ abscess drain placed in IR 11/14 Feels better already Slept well Less pain Started on regular soft diet. Hoping for discharge soon.  Allergies: Patient has no known allergies.  Medications:  Current Facility-Administered Medications:  .  0.9 %  sodium chloride infusion, 250 mL, Intravenous, PRN, Michael Boston, MD .  acetaminophen (TYLENOL) tablet 650 mg, 650 mg, Oral, Q6H PRN, 650 mg at 07/16/18 2015 **OR** acetaminophen (TYLENOL) suppository 650 mg, 650 mg, Rectal, Q6H PRN, Georganna Skeans, MD .  alum & mag hydroxide-simeth (MAALOX/MYLANTA) 200-200-20 MG/5ML suspension 30 mL, 30 mL, Oral, Q6H PRN, Michael Boston, MD .  bismuth subsalicylate (PEPTO BISMOL) 262 MG/15ML suspension 30 mL, 30 mL, Oral, Q8H PRN, Michael Boston, MD .  diphenhydrAMINE (BENADRYL) capsule 25 mg, 25 mg, Oral, Q6H PRN, 25 mg at 07/14/18 2156 **OR** diphenhydrAMINE (BENADRYL) injection 25 mg, 25 mg, Intravenous, Q6H PRN, Georganna Skeans, MD .  enoxaparin (LOVENOX) injection 40 mg, 40 mg, Subcutaneous, Q24H, Ardis Rowan, PA-C, 40 mg at 07/17/18 0518 .  famotidine (PEPCID) tablet 20 mg, 20 mg, Oral, QHS, Earnstine Regal, PA-C, 20 mg at 07/16/18 2018 .  gabapentin (NEURONTIN) capsule 300 mg, 300 mg, Oral, QHS, Gross, Remo Lipps, MD .  guaiFENesin-dextromethorphan (ROBITUSSIN DM) 100-10 MG/5ML syrup 10 mL, 10 mL, Oral, Q4H PRN, Michael Boston, MD .  hydrALAZINE (APRESOLINE) injection 10 mg, 10 mg, Intravenous, Q2H PRN, Georganna Skeans, MD .  hydrocortisone (ANUSOL-HC) 2.5 % rectal cream 1 application, 1 application, Topical, QID PRN, Michael Boston, MD .  hydrocortisone cream 1 % 1 application, 1 application, Topical, TID PRN, Michael Boston, MD .  HYDROmorphone (DILAUDID) injection 1 mg, 1 mg, Intravenous, Q2H PRN, Georganna Skeans, MD, 1 mg at  07/15/18 0924 .  lactated ringers bolus 1,000 mL, 1,000 mL, Intravenous, TID PRN, Michael Boston, MD .  lip balm (CARMEX) ointment 1 application, 1 application, Topical, BID, Gross, Remo Lipps, MD .  magic mouthwash, 15 mL, Oral, QID PRN, Michael Boston, MD .  menthol-cetylpyridinium (CEPACOL) lozenge 3 mg, 1 lozenge, Oral, PRN, Michael Boston, MD .  ondansetron (ZOFRAN-ODT) disintegrating tablet 4 mg, 4 mg, Oral, Q6H PRN **OR** ondansetron (ZOFRAN) injection 4 mg, 4 mg, Intravenous, Q6H PRN, Georganna Skeans, MD .  oxyCODONE (Oxy IR/ROXICODONE) immediate release tablet 5-10 mg, 5-10 mg, Oral, Q4H PRN, Georganna Skeans, MD, 5 mg at 07/15/18 1803 .  phenol (CHLORASEPTIC) mouth spray 1-2 spray, 1-2 spray, Mouth/Throat, PRN, Michael Boston, MD .  piperacillin-tazobactam (ZOSYN) IVPB 3.375 g, 3.375 g, Intravenous, Q8H, Georganna Skeans, MD, Last Rate: 12.5 mL/hr at 07/17/18 0517, 3.375 g at 07/17/18 0517 .  potassium chloride SA (K-DUR,KLOR-CON) CR tablet 40 mEq, 40 mEq, Oral, BID, Gross, Remo Lipps, MD .  psyllium (HYDROCIL/METAMUCIL) packet 1 packet, 1 packet, Oral, Daily, Gross, Remo Lipps, MD .  saccharomyces boulardii (FLORASTOR) capsule 250 mg, 250 mg, Oral, BID, Earnstine Regal, PA-C, 250 mg at 07/16/18 2016 .  sodium chloride flush (NS) 0.9 % injection 3 mL, 3 mL, Intravenous, Q12H, Gross, Remo Lipps, MD .  sodium chloride flush (NS) 0.9 % injection 3 mL, 3 mL, Intravenous, PRN, Michael Boston, MD .  sodium chloride flush (NS) 0.9 % injection 5 mL, 5 mL, Intracatheter, Q8H, Greggory Keen, MD, 5 mL at 07/17/18 0518 .  zolpidem (AMBIEN) tablet 5 mg, 5 mg, Oral, QHS PRN,MR X 1, Georganna Skeans, MD    Vital  Signs: BP (!) 137/95 (BP Location: Right Wrist)   Pulse 71   Temp 98.7 F (37.1 C) (Oral)   Resp 18   Ht 5\' 7"  (1.702 m)   Wt 78 kg   SpO2 98%   BMI 26.94 kg/m   Physical Exam  Abdominal: Soft. Normal appearance.  Skin: Skin is warm and dry.  Site is clean and dry NT no bleeding OP milky yellow       Imaging: Dg Chest 2 View  Result Date: 07/14/2018 CLINICAL DATA:  Fever. EXAM: CHEST - 2 VIEW COMPARISON:  None. FINDINGS: The heart size and mediastinal contours are within normal limits. No pneumothorax is noted. Mild left basilar atelectasis or infiltrate is noted with possible small pleural effusion. Minimal right lateral basilar subsegmental atelectasis is noted. The visualized skeletal structures are unremarkable. IMPRESSION: Mild left basilar atelectasis or infiltrate is noted with possible small pleural effusion. Minimal right basilar subsegmental atelectasis is noted laterally. Electronically Signed   By: Marijo Conception, M.D.   On: 07/14/2018 15:22   Ct Abdomen Pelvis W Contrast  Result Date: 07/14/2018 CLINICAL DATA:  Left flank pain with nausea and vomiting for 2 days. Sigmoid diverticulitis diagnosed on the prior exam. EXAM: CT ABDOMEN AND PELVIS WITH CONTRAST TECHNIQUE: Multidetector CT imaging of the abdomen and pelvis was performed using the standard protocol following bolus administration of intravenous contrast. CONTRAST:  161mL OMNIPAQUE IOHEXOL 300 MG/ML  SOLN COMPARISON:  07/10/2018. FINDINGS: Lower chest: Small, left greater than right, pleural effusions. Minor dependent lower lobe atelectasis, also greater on the left. Heart normal size. Hepatobiliary: No focal liver abnormality is seen. No gallstones, gallbladder wall thickening, or biliary dilatation. Pancreas: Unremarkable. No pancreatic ductal dilatation or surrounding inflammatory changes. Spleen: Normal in size without focal abnormality. Adrenals/Urinary Tract: No adrenal masses. Kidneys normal size, orientation and position with symmetric enhancement and excretion. Small stone noted in the lower pole of the left kidney. No renal masses. No hydronephrosis. Normal ureters. Normal bladder. Stomach/Bowel: There is thickening of the wall of the sigmoid colon where there are several diverticula. There is surrounding  inflammation. A tract extends from the posterior aspect of the affected sigmoid colon to connect to a small collection that contains a small amount of contrast and small bubbles of air. This extends laterally to connect to a large collection in the left lower quadrant and pelvis containing fluid and air. This collection measures 15 x 6 x 12 cm. There are additional small collections, in the anterior pelvis, anterior and slightly superior to the involved sigmoid colon, measuring 2.9 x 1.1 cm, as well as a thin collection, which extends along the lateral aspect the peritoneal cavity, measuring 12 mm in greatest thickness. These collections have developed since the prior CT. Previously seen free air has mostly resolved. No other areas of colonic inflammation. Small bowel lies adjacent to the pelvic and left lower quadrant collections. Mild fold thickening is evident with the bowel is in close approximation to the collections consistent with reactive edema. Small bowel is normal in caliber its entirety. No other wall fold thickening. Normal appendix visualized. Vascular/Lymphatic: No pathologically enlarged lymph nodes. Mild aortic atherosclerosis. Reproductive: Unremarkable. Other: No hernia. Trace free fluid most evident adjacent to the spleen. Musculoskeletal: No acute or significant osseous findings. IMPRESSION: 1. There has been interval worsening since the prior CT. New abscesses have developed extending from the perforation along the posterior margin of the inflamed sigmoid colon. Largest collection is in the left pelvis/left lower quadrant,  measuring 15 x 6 x 12 cm. Inflammatory changes along the sigmoid colon are similar in severity. Most of the free intraperitoneal air has resorbed or organized into the new collections. Electronically Signed   By: Lajean Manes M.D.   On: 07/14/2018 15:08   Ct Image Guided Drainage By Percutaneous Catheter  Result Date: 07/15/2018 INDICATION: Enlarging left lower quadrant  abdominal abscess EXAM: CT GUIDED DRAINAGE OF LEFT LOWER QUADRANT ABDOMINAL ABSCESS MEDICATIONS: The patient is currently admitted to the hospital and receiving intravenous antibiotics. The antibiotics were administered within an appropriate time frame prior to the initiation of the procedure. ANESTHESIA/SEDATION: 2.0 mg IV Versed 100 mcg IV Fentanyl Moderate Sedation Time:  16 MINUTES The patient was continuously monitored during the procedure by the interventional radiology nurse under my direct supervision. COMPLICATIONS: None immediate. TECHNIQUE: Informed written consent was obtained from the patient after a thorough discussion of the procedural risks, benefits and alternatives. All questions were addressed. Maximal Sterile Barrier Technique was utilized including caps, mask, sterile gowns, sterile gloves, sterile drape, hand hygiene and skin antiseptic. A timeout was performed prior to the initiation of the procedure. PROCEDURE: Previous imaging reviewed. Patient positioned supine. Noncontrast localization CT performed. The large left lower quadrant abdominal abscess was localized. Overlying skin marked for an anterior oblique approach. Under sterile conditions and local anesthesia, a 18 gauge 10 cm access needle was advanced percutaneously under CT guidance into the fluid collection. Needle position confirmed with CT. Syringe aspiration yielded purulent fluid. Sample sent for culture. Guidewire inserted followed by tract dilatation to insert a 12 [place] drain. Drain catheter position confirmed with CT. Aspiration yielded 100 cc of purulent fluid. Catheter secured with Prolene suture and connected to external suction bulb. Sterile dressing applied. No immediate complication. FINDINGS: Imaging confirms needle placement into the left lower quadrant abscess for drain placement. IMPRESSION: Successful CT-guided left lower quadrant abscess drain insertion Electronically Signed   By: Jerilynn Mages.  Shick M.D.   On: 07/15/2018  12:44    Labs:  CBC: Recent Labs    07/14/18 0146 07/15/18 0227 07/16/18 0315 07/17/18 0508  WBC 10.4 10.4 9.3 9.3  HGB 14.4 14.9 14.3 13.8  HCT 41.4 43.4 42.2 40.3  PLT 237 284 341 407*    COAGS: Recent Labs    07/15/18 0227  INR 0.99    BMP: Recent Labs    07/14/18 0819 07/15/18 0227 07/16/18 0315 07/17/18 0508  NA 133* 133* 134* 134*  K 3.1* 3.2* 3.2* 3.2*  CL 101 103 101 104  CO2 24 21* 24 22  GLUCOSE 97 116* 101* 98  BUN 6 <5* <5* <5*  CALCIUM 7.8* 7.8* 7.9* 7.9*  CREATININE 0.97 1.03 1.05 0.88  GFRNONAA >60 >60 >60 >60  GFRAA >60 >60 >60 >60    LIVER FUNCTION TESTS: Recent Labs    07/10/18 1122  BILITOT 0.7  AST 20  ALT 20  ALKPHOS 50  PROT 6.5  ALBUMIN 3.3*    Assessment and Plan: LLQ abscess drain intact Will follow If home with drain-- will need flush 5-10 cc daily Record OP Will arrange follow up CT/drain injection in IR OP Clinic  Electronically Signed: Ascencion Dike, PA-C 07/17/2018, 11:17 AM   I spent a total of 15 Minutes at the the patient's bedside AND on the patient's hospital floor or unit, greater than 50% of which was counseling/coordinating care for LLQ abscess drain

## 2018-07-18 MED ORDER — AMOXICILLIN-POT CLAVULANATE 875-125 MG PO TABS: 1.0000 | ORAL_TABLET | Freq: Two times a day (BID) | ORAL | 1 refills | Status: DC

## 2018-07-18 NOTE — Discharge Summary (Signed)
Physician Discharge Summary    Patient ID: MACALLISTER ASHMEAD MRN: [id number] DOB/AGE: 1971-02-15  47 y.o.  Admit date: 07/10/2018 Discharge date: 07/18/2018   Hospital Stay = 8 days  Patient Care Team: Patient, No Pcp Per as PCP - General (General Practice)  Discharge Diagnoses:  Principal Problem:   Diverticulitis of colon with perforation and abscess Active Problems:   Hypokalemia   Intra-abdominal abscess s/p perc drainage 07/15/2018      * No surgery found *  POST-OPERATIVE DIAGNOSIS:   * No surgery found *  SURGERY:  * No surgery found *    SURGEON:    * Surgery not found *  Consults: Interventional Radiology  Hospital Course:   Patient with abdominal pain and found to have diverticulitis in the emergency room.  Placed on oral ciprofloxacin.  Worsen.  Admitted 11/9.  Had some improvements on IV antibiotics but then had worsening symptoms.  Repeat CT scan revealed a large abscess.  This was percutaneously drained by Dr. Annamaria Boots with interventional radiology.  Pain abated.  Ileus resolved.  Pain and other symptoms were treated aggressively.    By the time of discharge, the patient was walking well the hallways, eating food, having flatus.  Pain was well-controlled on an oral medications.  Intraoperative gone down.  Based on meeting discharge criteria and continuing to recover, I felt it was safe for the patient to be discharged from the hospital to further recover with close followup.  Drain care will be talked to the family.  Recommendations were discussed in detail.  They are written as well.  Discharged Condition: good  Disposition:  [place] Follow up.   Contact information: [address] [place] [address] [phone]        Clovis Riley, MD. Schedule an appointment as soon as possible for a visit in 2 week(s).   Specialty:  General Surgery Contact information: [address] [place] [address] [phone]        [facility]. Schedule an appointment as soon as possible for a visit in 2 week(s).   Specialty:  Radiology Why:  You had a drain placed by Dr. Reesa Chew with interventional radiology.  You would benefit from an [place] drain injection study to see if your abscess has closed up or if you have developed a chronic colon fistula.   Contact information: [place] [phone]          Discharge disposition: 01-Home or Self Care       Discharge Instructions    Call MD for:   Complete by:  As directed    FEVER > 101.5 F  (temperatures < 101.5 F are not significant)   Call MD for:  extreme fatigue   Complete by:  As directed    Call MD for:  persistant dizziness or light-headedness   Complete by:  As directed    Call MD for:  persistant nausea and vomiting   Complete by:  As directed    Call MD for:  redness, tenderness, or signs of infection (pain, swelling, redness, odor or green/yellow discharge around incision site)   Complete by:  As directed    Call MD for:  severe uncontrolled pain   Complete by:  As directed    Diet - low sodium heart healthy   Complete by:  As directed  Start with a bland diet such as soups, liquids, starchy foods, low fat foods, etc. the first few days at home. Gradually advance to a solid, low-fat, high fiber diet by the end of the first week at home.   Add a fiber supplement to your diet (Metamucil, etc) If you feel full, bloated, or constipated, stay on a full liquid or pureed/blenderized diet for a few days until you feel better and are no longer constipated.   Discharge instructions   Complete by:  As directed    See Discharge Instructions If you are not getting better after two weeks or are noticing you are getting worse, contact our office [phone] for further advice.  We may need to adjust your  medications, re-evaluate you in the office, send you to the emergency room, or see what other things we can do to help. The clinic staff is available to answer your questions during regular business hours (8:30am-5pm).  Please don't hesitate to call and ask to speak to one of our nurses for clinical concerns.    A surgeon from [facility] is always on call at the hospitals 24 hours/day If you have a medical emergency, go to the nearest emergency room or call 911.   Discharge wound care:   Complete by:  As directed    Shower every day.  Short baths are fine.  Wash the incisions and wounds clean with soap & water.     You may leave closed incisions open to air if it is dry.   You may cover the incision with clean gauze & replace it after your daily shower for comfort.   You have a drain, wash around the skin exit site with soap & water and place a new dressing of gauze or band aid around the skin every day.  Keep the drain site clean & dry.   Driving Restrictions   Complete by:  As directed    You may drive when: - you are no longer taking narcotic prescription pain medication - you can comfortably wear a seatbelt - you can safely make sudden turns/stops without pain.   Increase activity slowly   Complete by:  As directed    Start light daily activities --- self-care, walking, climbing stairs- beginning the day after surgery.  Gradually increase activities as tolerated.  Control your pain to be active.  Stop when you are tired.  Ideally, walk several times a day, eventually an hour a day.   Most people are back to most day-to-day activities in a few weeks.  It takes 4-6 weeks to get back to unrestricted, intense activity. If you can walk 30 minutes without difficulty, it is safe to try more intense activity such as jogging, treadmill, bicycling, low-impact aerobics, swimming, etc. Save the most intensive and strenuous activity for last (Usually 4-8 weeks after surgery) such as  sit-ups, heavy lifting, contact sports, etc.  Refrain from any intense heavy lifting or straining until you are off narcotics for pain control.  You will have off days, but things should improve week-by-week. DO NOT PUSH THROUGH PAIN.  Let pain be your guide: If it hurts to do something, don't do it.   Lifting restrictions   Complete by:  As directed    If you can walk 30 minutes without difficulty, it is safe to try more intense activity such as jogging, treadmill, bicycling, low-impact aerobics, swimming, etc. Save the most intensive and strenuous activity for last (Usually 4-8 weeks after  surgery) such as sit-ups, heavy lifting, contact sports, etc.   Refrain from any intense heavy lifting or straining until you are off narcotics for pain control.  You will have off days, but things should improve week-by-week. DO NOT PUSH THROUGH PAIN.  Let pain be your guide: If it hurts to do something, don't do it.  Pain is your body warning you to avoid that activity for another week until the pain goes down.   May shower / Bathe   Complete by:  As directed    May walk up steps   Complete by:  As directed    Sexual Activity Restrictions   Complete by:  As directed    You may have sexual intercourse when it is comfortable. If it hurts to do something, stop.      Allergies as of 07/18/2018   No Known Allergies     Medication List    STOP taking these medications   ciprofloxacin 500 MG tablet Commonly known as:  CIPRO     TAKE these medications   acetaminophen 325 MG tablet Commonly known as:  TYLENOL Take 650 mg by mouth every 6 (six) hours as needed for fever.   amoxicillin-clavulanate 875-125 MG tablet Commonly known as:  AUGMENTIN Take 1 tablet by mouth 2 (two) times daily.            Discharge Care Instructions  (From admission, onward)         Start     Ordered   07/18/18 0000  Discharge wound care:    Comments:  Shower every day.  Short baths are fine.  Wash the incisions  and wounds clean with soap & water.     You may leave closed incisions open to air if it is dry.   You may cover the incision with clean gauze & replace it after your daily shower for comfort.   You have a drain, wash around the skin exit site with soap & water and place a new dressing of gauze or band aid around the skin every day.  Keep the drain site clean & dry.   07/18/18 0835          Significant Diagnostic Studies:  Results for orders placed or performed during the hospital encounter of 07/10/18 (from the past 72 hour(s))  Aerobic/Anaerobic Culture (surgical/deep wound)     Status: None (Preliminary result)   Collection Time: 07/15/18 12:07 PM  Result Value Ref Range   Specimen Description ABSCESS ABDOMEN    Special Requests NONE    Gram Stain      ABUNDANT WBC PRESENT, PREDOMINANTLY PMN NO ORGANISMS SEEN Performed at [facility] [address] [place], [place] [address]    Culture      FEW CANDIDA ALBICANS NO ANAEROBES ISOLATED; CULTURE IN PROGRESS FOR 5 DAYS    Report Status PENDING   CBC     Status: None   Collection Time: 07/16/18  3:15 AM  Result Value Ref Range   WBC 9.3 4.0 - 10.5 K/uL   RBC 4.42 4.22 - 5.81 MIL/uL   Hemoglobin 14.3 13.0 - 17.0 g/dL   HCT 42.2 39.0 - 52.0 %   MCV 95.5 80.0 - 100.0 fL   MCH 32.4 26.0 - 34.0 pg   MCHC 33.9 30.0 - 36.0 g/dL   RDW 12.2 11.5 - 15.5 %   Platelets 341 150 - 400 K/uL   nRBC 0.0 0.0 - 0.2 %    Comment: Performed  at [facility], [place] [address], [place], [place] [address]  Basic metabolic panel     Status: Abnormal   Collection Time: 07/16/18  3:15 AM  Result Value Ref Range   Sodium 134 (L) 135 - 145 mmol/L   Potassium 3.2 (L) 3.5 - 5.1 mmol/L   Chloride 101 98 - 111 mmol/L   CO2 24 22 - 32 mmol/L   Glucose, Bld 101 (H) 70 - 99 mg/dL   BUN <5 (L) 6 - 20 mg/dL   Creatinine, Ser 1.05 0.61 - 1.24 mg/dL   Calcium 7.9 (L) 8.9 - 10.3 mg/dL   GFR calc non Af Amer >60 >60 mL/min   GFR calc Af Amer  >60 >60 mL/min    Comment: (NOTE) The eGFR has been calculated using the CKD EPI equation. This calculation has not been validated in all clinical situations. eGFR's persistently <60 mL/min signify possible Chronic Kidney Disease.    Anion gap 9 5 - 15    Comment: Performed at [facility] [address], [place], [place] [address]  Magnesium     Status: None   Collection Time: 07/16/18  3:15 AM  Result Value Ref Range   Magnesium 1.9 1.7 - 2.4 mg/dL    Comment: Performed at [facility] [address] [place], [place] [address]  Basic metabolic panel     Status: Abnormal   Collection Time: 07/17/18  5:08 AM  Result Value Ref Range   Sodium 134 (L) 135 - 145 mmol/L   Potassium 3.2 (L) 3.5 - 5.1 mmol/L   Chloride 104 98 - 111 mmol/L   CO2 22 22 - 32 mmol/L   Glucose, Bld 98 70 - 99 mg/dL   BUN <5 (L) 6 - 20 mg/dL   Creatinine, Ser 0.88 0.61 - 1.24 mg/dL   Calcium 7.9 (L) 8.9 - 10.3 mg/dL   GFR calc non Af Amer >60 >60 mL/min   GFR calc Af Amer >60 >60 mL/min    Comment: (NOTE) The eGFR has been calculated using the CKD EPI equation. This calculation has not been validated in all clinical situations. eGFR's persistently <60 mL/min signify possible Chronic Kidney Disease.    Anion gap 8 5 - 15    Comment: Performed at [facility] [address], [place], [place] [address]  CBC     Status: Abnormal   Collection Time: 07/17/18  5:08 AM  Result Value Ref Range   WBC 9.3 4.0 - 10.5 K/uL   RBC 4.23 4.22 - 5.81 MIL/uL   Hemoglobin 13.8 13.0 - 17.0 g/dL   HCT 40.3 39.0 - 52.0 %   MCV 95.3 80.0 - 100.0 fL   MCH 32.6 26.0 - 34.0 pg   MCHC 34.2 30.0 - 36.0 g/dL   RDW 12.1 11.5 - 15.5 %   Platelets 407 (H) 150 - 400 K/uL   nRBC 0.0 0.0 - 0.2 %    Comment: Performed at [facility], [place] [address] [place], [place] [address]  Magnesium     Status: None   Collection Time: 07/17/18  5:08 AM  Result Value Ref Range   Magnesium 1.9 1.7 - 2.4 mg/dL     Comment: Performed at [facility] [address] [place],  [address]    Dg Chest 2 View  Result Date: 07/14/2018 CLINICAL DATA:  Fever. EXAM: CHEST - 2 VIEW COMPARISON:  None. FINDINGS: The heart size and mediastinal contours  are within normal limits. No pneumothorax is noted. Mild left basilar atelectasis or infiltrate is noted with possible small pleural effusion. Minimal right lateral basilar subsegmental atelectasis is noted. The visualized skeletal structures are unremarkable. IMPRESSION: Mild left basilar atelectasis or infiltrate is noted with possible small pleural effusion. Minimal right basilar subsegmental atelectasis is noted laterally. Electronically Signed   By: Marijo Conception, M.D.   On: 07/14/2018 15:22   Ct Abdomen Pelvis W Contrast  Result Date: 07/14/2018 CLINICAL DATA:  Left flank pain with nausea and vomiting for 2 days. Sigmoid diverticulitis diagnosed on the prior exam. EXAM: CT ABDOMEN AND PELVIS WITH CONTRAST TECHNIQUE: Multidetector CT imaging of the abdomen and pelvis was performed using the standard protocol following bolus administration of intravenous contrast. CONTRAST:  161m OMNIPAQUE IOHEXOL 300 MG/ML  SOLN COMPARISON:  07/10/2018. FINDINGS: Lower chest: Small, left greater than right, pleural effusions. Minor dependent lower lobe atelectasis, also greater on the left. Heart normal size. Hepatobiliary: No focal liver abnormality is seen. No gallstones, gallbladder wall thickening, or biliary dilatation. Pancreas: Unremarkable. No pancreatic ductal dilatation or surrounding inflammatory changes. Spleen: Normal in size without focal abnormality. Adrenals/Urinary Tract: No adrenal masses. Kidneys normal size, orientation and position with symmetric enhancement and excretion. Small stone noted in the lower pole of the left kidney. No renal masses. No hydronephrosis. Normal ureters. Normal bladder. Stomach/Bowel: There is thickening of the wall of the sigmoid  colon where there are several diverticula. There is surrounding inflammation. A tract extends from the posterior aspect of the affected sigmoid colon to connect to a small collection that contains a small amount of contrast and small bubbles of air. This extends laterally to connect to a large collection in the left lower quadrant and pelvis containing fluid and air. This collection measures 15 x 6 x 12 cm. There are additional small collections, in the anterior pelvis, anterior and slightly superior to the involved sigmoid colon, measuring 2.9 x 1.1 cm, as well as a thin collection, which extends along the lateral aspect the peritoneal cavity, measuring 12 mm in greatest thickness. These collections have developed since the prior CT. Previously seen free air has mostly resolved. No other areas of colonic inflammation. Small bowel lies adjacent to the pelvic and left lower quadrant collections. Mild fold thickening is evident with the bowel is in close approximation to the collections consistent with reactive edema. Small bowel is normal in caliber its entirety. No other wall fold thickening. Normal appendix visualized. Vascular/Lymphatic: No pathologically enlarged lymph nodes. Mild aortic atherosclerosis. Reproductive: Unremarkable. Other: No hernia. Trace free fluid most evident adjacent to the spleen. Musculoskeletal: No acute or significant osseous findings. IMPRESSION: 1. There has been interval worsening since the prior CT. New abscesses have developed extending from the perforation along the posterior margin of the inflamed sigmoid colon. Largest collection is in the left pelvis/left lower quadrant, measuring 15 x 6 x 12 cm. Inflammatory changes along the sigmoid colon are similar in severity. Most of the free intraperitoneal air has resorbed or organized into the new collections. Electronically Signed   By: DLajean ManesM.D.   On: 07/14/2018 15:08   Ct Image Guided Drainage By Percutaneous  Catheter  Result Date: 07/15/2018 INDICATION: Enlarging left lower quadrant abdominal abscess EXAM: CT GUIDED DRAINAGE OF LEFT LOWER QUADRANT ABDOMINAL ABSCESS MEDICATIONS: The patient is currently admitted to the hospital and receiving intravenous antibiotics. The antibiotics were administered within an appropriate time frame prior to the initiation of the procedure. ANESTHESIA/SEDATION: 2.0  mg IV Versed 100 mcg IV Fentanyl Moderate Sedation Time:  16 MINUTES The patient was continuously monitored during the procedure by the interventional radiology nurse under my direct supervision. COMPLICATIONS: None immediate. TECHNIQUE: Informed written consent was obtained from the patient after a thorough discussion of the procedural risks, benefits and alternatives. All questions were addressed. Maximal Sterile Barrier Technique was utilized including caps, mask, sterile gowns, sterile gloves, sterile drape, hand hygiene and skin antiseptic. A timeout was performed prior to the initiation of the procedure. PROCEDURE: Previous imaging reviewed. Patient positioned supine. Noncontrast localization CT performed. The large left lower quadrant abdominal abscess was localized. Overlying skin marked for an anterior oblique approach. Under sterile conditions and local anesthesia, a 18 gauge 10 cm access needle was advanced percutaneously under CT guidance into the fluid collection. Needle position confirmed with CT. Syringe aspiration yielded purulent fluid. Sample sent for culture. Guidewire inserted followed by tract dilatation to insert a 12 [place] drain. Drain catheter position confirmed with CT. Aspiration yielded 100 cc of purulent fluid. Catheter secured with Prolene suture and connected to external suction bulb. Sterile dressing applied. No immediate complication. FINDINGS: Imaging confirms needle placement into the left lower quadrant abscess for drain placement. IMPRESSION: Successful CT-guided left lower quadrant  abscess drain insertion Electronically Signed   By: Jerilynn Mages.  Shick M.D.   On: 07/15/2018 12:44    Discharge Exam: Blood pressure 133/79, pulse 71, temperature 99 F (37.2 C), temperature source Oral, resp. rate 16, height '5\' 7"'$  (1.702 m), weight 78 kg, SpO2 94 %.  General: Pt awake/alert/oriented x4 in No acute distress Eyes: PERRL, normal EOM.  Sclera clear.  No icterus Neuro: CN II-XII intact w/o focal sensory/motor deficits. Lymph: No head/neck/groin lymphadenopathy Psych:  No delerium/psychosis/paranoia HENT: Normocephalic, Mucus membranes moist.  No thrush Neck: Supple, No tracheal deviation Chest: No chest wall pain w good excursion CV:  Pulses intact.  Regular rhythm MS: Normal AROM mjr joints.  No obvious deformity Abdomen: Soft.  Nondistended.  Mildly sensitive at left lower quadrant percutaneous drain.  Drain with thin brackish output.  No evidence of peritonitis.  No incarcerated hernias. Ext:  SCDs BLE.  No mjr edema.  No cyanosis Skin: No petechiae / purpura  History reviewed. No pertinent past medical history.  History reviewed. No pertinent surgical history.  Social History   Socioeconomic History  . Marital status: Married    Spouse name: Not on file  . Number of children: Not on file  . Years of education: Not on file  . Highest education level: Not on file  Occupational History  . Not on file  Social Needs  . Financial resource strain: Not on file  . Food insecurity:    Worry: Not on file    Inability: Not on file  . Transportation needs:    Medical: Not on file    Non-medical: Not on file  Tobacco Use  . Smoking status: Current Some Day Smoker  . Smokeless tobacco: Never Used  Substance and Sexual Activity  . Alcohol use: Yes    Alcohol/week: 1.0 standard drinks    Types: 1 Glasses of wine per week  . Drug use: Yes    Types: Marijuana    Comment: unsure  . Sexual activity: Not on file  Lifestyle  . Physical activity:    Days per week: Not on file     Minutes per session: Not on file  . Stress: Not on file  Relationships  . Social connections:  Talks on phone: Not on file    Gets together: Not on file    Attends religious service: Not on file    Active member of club or organization: Not on file    Attends meetings of clubs or organizations: Not on file    Relationship status: Not on file  . Intimate partner violence:    Fear of current or ex partner: Not on file    Emotionally abused: Not on file    Physically abused: Not on file    Forced sexual activity: Not on file  Other Topics Concern  . Not on file  Social History Narrative  . Not on file    History reviewed. No pertinent family history.  Current Facility-Administered Medications  Medication Dose Route Frequency Provider Last Rate Last Dose  . 0.9 %  sodium chloride infusion  250 mL Intravenous PRN Michael Boston, MD      . acetaminophen (TYLENOL) tablet 650 mg  650 mg Oral Q6H PRN Georganna Skeans, MD   650 mg at 07/16/18 2015   Or  . acetaminophen (TYLENOL) suppository 650 mg  650 mg Rectal Q6H PRN Georganna Skeans, MD      . alum & mag hydroxide-simeth (MAALOX/MYLANTA) 200-200-20 MG/5ML suspension 30 mL  30 mL Oral Q6H PRN Michael Boston, MD      . bismuth subsalicylate (PEPTO BISMOL) 262 MG/15ML suspension 30 mL  30 mL Oral Q8H PRN Michael Boston, MD      . diphenhydrAMINE (BENADRYL) capsule 25 mg  25 mg Oral Q6H PRN Georganna Skeans, MD   25 mg at 07/14/18 2156   Or  . diphenhydrAMINE (BENADRYL) injection 25 mg  25 mg Intravenous Q6H PRN Georganna Skeans, MD      . enoxaparin (LOVENOX) injection 40 mg  40 mg Subcutaneous Q24H Ardis Rowan, PA-C   40 mg at 07/18/18 0532  . famotidine (PEPCID) tablet 20 mg  20 mg Oral QHS Earnstine Regal, PA-C   20 mg at 07/17/18 2040  . gabapentin (NEURONTIN) capsule 300 mg  300 mg Oral Ardeen Fillers, MD   300 mg at 07/17/18 2041  . guaiFENesin-dextromethorphan (ROBITUSSIN DM) 100-10 MG/5ML syrup 10 mL  10 mL Oral Q4H  PRN Michael Boston, MD      . hydrALAZINE (APRESOLINE) injection 10 mg  10 mg Intravenous Q2H PRN Georganna Skeans, MD      . hydrocortisone (ANUSOL-HC) 2.5 % rectal cream 1 application  1 application Topical QID PRN Michael Boston, MD      . hydrocortisone cream 1 % 1 application  1 application Topical TID PRN Michael Boston, MD      . HYDROmorphone (DILAUDID) injection 1 mg  1 mg Intravenous Q2H PRN Georganna Skeans, MD   1 mg at 07/15/18 0924  . lactated ringers bolus 1,000 mL  1,000 mL Intravenous TID PRN Michael Boston, MD      . lip balm (CARMEX) ointment 1 application  1 application Topical BID Michael Boston, MD   1 application at 64/40/34 2050  . magic mouthwash  15 mL Oral QID PRN Michael Boston, MD      . menthol-cetylpyridinium (CEPACOL) lozenge 3 mg  1 lozenge Oral PRN Michael Boston, MD      . ondansetron (ZOFRAN-ODT) disintegrating tablet 4 mg  4 mg Oral Q6H PRN Georganna Skeans, MD       Or  . ondansetron [facility]) injection 4 mg  4 mg Intravenous Q6H PRN Georganna Skeans, MD      .  oxyCODONE (Oxy IR/ROXICODONE) immediate release tablet 5-10 mg  5-10 mg Oral Q4H PRN Georganna Skeans, MD   5 mg at 07/15/18 1803  . phenol (CHLORASEPTIC) mouth spray 1-2 spray  1-2 spray Mouth/Throat PRN Michael Boston, MD      . piperacillin-tazobactam (ZOSYN) IVPB 3.375 g  3.375 g Intravenous Q8H Georganna Skeans, MD 12.5 mL/hr at 07/18/18 0600    . potassium chloride SA (K-DUR,KLOR-CON) CR tablet 40 mEq  40 mEq Oral BID Michael Boston, MD   40 mEq at 07/17/18 2040  . psyllium (HYDROCIL/METAMUCIL) packet 1 packet  1 packet Oral Daily Michael Boston, MD   1 packet at 07/17/18 1308  . saccharomyces boulardii (FLORASTOR) capsule 250 mg  250 mg Oral BID Earnstine Regal, PA-C   250 mg at 07/17/18 2041  . sodium chloride flush (NS) 0.9 % injection 3 mL  3 mL Intravenous Gorden Harms, MD   3 mL at 07/17/18 2050  . sodium chloride flush (NS) 0.9 % injection 3 mL  3 mL Intravenous PRN Michael Boston, MD      . sodium  chloride flush (NS) 0.9 % injection 5 mL  5 mL Intracatheter Q8H Greggory Keen, MD   5 mL at 07/18/18 0535  . zolpidem (AMBIEN) tablet 5 mg  5 mg Oral QHS PRN,MR X 1 Georganna Skeans, MD         No Known Allergies  Signed: Morton Peters, MD, FACS, MASCRS [facility]ally [facility]    [address], [place], [place] [address] [phone] Main / Paging [phone] Fax   07/18/2018, 8:35 AM

## 2018-07-18 NOTE — Discharge Instructions (Signed)
DRAIN CARE:   You have a closed bulb drain to help you heal.    A bulb drain is a small, plastic reservoir which creates a gentle suction. It is used to remove excess fluid from a surgical wound. The color and amount of fluid will vary. Immediately after surgery, the fluid is bright red. It may gradually change to a yellow color. When the amount decreases to about 1 or 2 tablespoons (15 to 30 cc) per 24 hours, your caregiver will usually remove it.  JP Care  The Jackson-Pratt drainage system has flexible tubing attached to a soft, plastic bulb with a stopper. The drainage end of the tubing, which is flat and white, goes into your body through a small opening near your incision (surgical cut). A stitch holds the drainage end in place. The rest of the tube is outside your body, attached to the bulb. When the bulb is compressed with the stopper in place, it creates a vacuum. This causes a constant gentle suction, which helps draw out fluid that collects under your incision. The bulb should be compressed at all times, except when you are emptying the drainage.  How long you will have your Jackson-Pratt depends on your surgery and the amount of fluid is draining. This is different for everyone. The Jackson-Pratt is usually removed when the drainage is 30 mL or less over 24 hours. To keep track of how much drainage youre having, you will record the amount in a drainage log. Its important to bring the log with you to your follow-up appointments.  Caring for Your Jackson-Pratt at Home In order to care for your Jackson-Pratt at home, you or your caregiver will do the following:  Empty the drain once a day and record the color and amount of drainage  Care for the area where the tubing enters your skin by washing with soap and water.  Milk the tubing to help move clots into the bulb.  Do this before you empty and measure your drainage. Look in the mirror at the tubing. This will help you see where your  hands need to be. Pinch the tubing close to where it goes into your skin between your thumb and forefinger. With the thumb and forefinger of your other hand, pinch the tubing right below your other fingers. Keep your fingers pinched and slide them down the tubing, pushing any clots down toward the bulb. You may want to use alcohol swabs to help you slide your fingers down the tubing. Repeat steps 3 and 4 as necessary to push clots from the tubing into the bulb. If you are not able to move a clot into the bulb, call your doctors office. The fluid may leak around the insertion site if a clot is blocking the drainage flow. If there is fluid in the bulb and no leakage at the insertion site, the drain is working.  How to Empty Your Jackson-Pratt and Record the Drainage You will need to empty your Jackson-Pratt every day  Gather the following supplies:  Measuring container your nurse gave you Jackson-Pratt Drainage Record  Pen or pencil  Instructions Clean an area to work on. Clean your hands thoroughly. Unplug the stopper on top of your Jackson-Pratt. This will cause the bulb to expand. Do not touch the inside of the stopper or the inner area of the opening on the bulb. Turn your Jackson-Pratt upside down, gently squeeze the bulb, and pour the drainage into the measuring container. Turn your Jackson-Pratt right  side up. Squeeze the bulb until your fingers feel the palm of your hand. Keep squeezing the bulb while you replug the stopper. Make sure the bulb stays fully compressed to ensure constant, gentle suction.    Check the amount and color of drainage in the measuring container. The first couple days after surgery the fluid may be dark red. This is normal. As you heal the fluid may look pink or pale yellow. Record this amount and the color of drainage on your Jackson-Pratt Drainage Record. Flush the drainage down the toilet and rinse the measuring container with water.  Caring for the  Insertion Site  Once you have emptied the drainage, clean your hands again. Check the area around the insertion site. Look for tenderness, swelling, or pus. If you have any of these, or if you have a temperature of 101 F (38.3 C) or higher, you may have an infection. Call your doctors office.  Sometimes, the drain causes redness the size of a dime at your insertion site. This is normal. Your healthcare provider will tell you if you should place a bandage over the insertion site.  Wash drain site with soap & water (dilute hydrogen peroxide PRN) daily & replace clean dressing / tape    DAILY CARE  Keep the bulb compressed at all times, except while emptying it. The compression creates suction.   Keep sites where the tubes enter the skin dry and covered with a light bandage (dressing).   Tape the tubes to your skin, 1 to 2 inches below the insertion sites, to keep from pulling on your stitches. Tubes are stitched in place and will not slip out.   Pin the bulb to your shirt (not to your pants) with a safety pin.   For the first few days after surgery, there usually is more fluid in the bulb. Empty the bulb whenever it becomes half full because the bulb does not create enough suction if it is too full. Include this amount in your 24 hour totals.   When the amount of drainage decreases, empty the bulb at the same time every day. Write down the amounts and the 24 hour totals. Your caregiver will want to know them. This helps your caregiver know when the tubes can be removed.   (We anticipate removing the drain in 1-3 weeks, depending on when the output is <12m a day for 2+ days)  If there is drainage around the tube sites, change dressings and keep the area dry. If you see a clot in the tube, leave it alone. However, if the tube does not appear to be draining, let your caregiver know.  TO EMPTY THE BULB  Open the stopper to release suction.   Holding the stopper out of the way, pour  drainage into the measuring cup that was sent home with you.   Measure and write down the amount. If there are 2 bulbs, note the amount of drainage from bulb 1 or bulb 2 and keep the totals separate. Your caregiver will want to know which tube is draining more.   Compress the bulb by folding it in half.   Replace the stopper.   Check the tape that holds the tube to your skin, and pin the bulb to your shirt.  SEEK MEDICAL CARE IF:  The drainage develops a bad odor.   You have an oral temperature above 102 F (38.9 C).   The amount of drainage from your wound suddenly increases or decreases.  You accidentally pull out your drain.   You have any other questions or concerns.  MAKE SURE YOU:   Understand these instructions.   Will watch your condition.   Will get help right away if you are not doing well or get worse.     Call our office if you have any questions about your drain. [phone]    EATING AFTER A SMALL BOWEL OBSTRUCTION   EAT Gradually transition to a high fiber diet with a fiber supplement over the next few days after discharge  WALK Walk an hour a day.  Control your pain to do that.    CONTROL PAIN Control pain so that you can walk, sleep, tolerate sneezing/coughing, go up/down stairs.  HAVE A BOWEL MOVEMENT DAILY Keep your bowels regular to avoid problems.  OK to try a laxative to override constipation.  OK to use an antidairrheal to slow down diarrhea.  Call if not better after 2 tries  CALL IF YOU HAVE PROBLEMS/CONCERNS Call if you are still struggling despite following these instructions. Call if you have concerns not answered by these instructions     After your BOWEL OBSTRUCTION, expect some issues over the next few weeks.    To help you through this temporary phase, we start you out on a pureed (blenderized) diet.  Your first meal in the hospital was thin liquids.  You should have been given a pureed diet by the time you left the  hospital.  We ask patients to stay on a pureed diet for the first few days to avoid anything getting "stuck."  Don't be alarmed if your ability to swallow doesn't progress according to this plan.  Everyone is different and some diets can advance more or less quickly.     Some BASIC RULES to follow are:  Maintain an upright position whenever eating or drinking.  Take small bites - just a teaspoon size bite at a time.  Eat slowly.  It may also help to eat only one food at a time.  Consider nibbling through smaller, more frequent meals & avoid the urge to eat BIG meals  Do not push through feelings of fullness, nausea, or bloatedness  Do not mix solid foods and liquids in the same mouthful  Try not to "wash foods down" with large gulps of liquids.  Avoid carbonated (bubbly/fizzy) drinks.    Avoid foods that make you feel gassy or bloated.  Start with bland foods first.  Wait on trying greasy, fried, or spicy meals until you are tolerating more bland solids well.  Expect to be more gassy/flatulent/bloated initially.  Walking will help your body manage it better.  Consider using medications for bloating that contain simethicone such as  Maalox or Gas-X   Eat in a relaxed atmosphere & minimize distractions.  Avoid talking while eating.    Do not use straws.  Following each meal, sit in an upright position (90 degree angle) for 60 to 90 minutes.  Going for a short walk can help as well  If food does stick, don't panic.  Try to relax and let the food pass on its own.  Sipping WARM LIQUID such as strong hot black tea can also help slide it down.   Be gradual in changes & use common sense:  -If you easily tolerating a certain "level" of foods, advance to the next level gradually -If you are having trouble swallowing a particular food, then avoid it.   -If food is sticking when you  advance your diet, go back to thinner previous diet (the lower LEVEL) for 1-2 days.  LEVEL 1 = PUREED  DIET  Do for the first [place] in this group are pureed or blenderized to a smooth, mashed potato-like consistency.  -If necessary, the pureed foods can keep their shape with the addition of a thickening agent.   -Meat should be pureed to a smooth, pasty consistency.  Hot broth or gravy may be added to the pureed meat, approximately 1 oz. of liquid per 3 oz. serving of meat. -CAUTION:  If any foods do not puree into a smooth consistency, swallowing will be more difficult.  (For example, nuts or seeds sometimes do not blend well.)  Hot Foods Cold Foods  Pureed scrambled eggs and cheese Pureed cottage cheese  Baby cereals Thickened juices and nectars  Thinned cooked cereals (no lumps) Thickened milk or eggnog  Pureed [place] toast or pancakes Ensure  Mashed potatoes Ice cream  Pureed parsley, au gratin, scalloped potatoes, candied sweet potatoes Fruit or [place] ice, sherbet  Pureed buttered or alfredo noodles Plain yogurt  Pureed vegetables (no corn or peas) Instant breakfast  Pureed soups and creamed soups Smooth pudding, mousse, custard  Pureed scalloped apples Whipped gelatin  Gravies Sugar, syrup, honey, jelly  Sauces, cheese, tomato, barbecue, white, creamed Cream  Any baby food Creamer  Alcohol in moderation (not beer or champagne) Margarine  Coffee or tea Mayonnaise   Ketchup, mustard   Apple sauce   SAMPLE MENU:  PUREED DIET Breakfast Lunch Dinner   Orange juice, 1/2 cup  Cream of wheat, 1/2 cup  Pineapple juice, 1/2 cup  Pureed [place], barley soup, 3/4 cup  Pureed Hawaiian chicken, 3 oz   Scrambled eggs, mashed or blended with cheese, 1/2 cup  Tea or coffee, 1 cup   Whole milk, 1 cup   Non-dairy creamer, 2 Tbsp.  Mashed potatoes, 1/2 cup  Pureed cooled broccoli, 1/2 cup  Apple sauce, 1/2 cup  Coffee or tea  Mashed potatoes, 1/2 cup  Pureed spinach, 1/2 cup  Frozen yogurt, 1/2 cup  Tea or coffee      LEVEL 2 =  SOFT DIET  After your first few days, you can advance to a soft diet.   Keep on this diet until everything goes down easily.  Hot Foods Cold Foods  White fish Cottage cheese  Stuffed fish Junior baby fruit  Baby food meals Semi thickened juices  Minced soft cooked, scrambled, poached eggs nectars  Souffle & omelets Ripe mashed bananas  Cooked cereals Canned fruit, pineapple sauce, milk  potatoes Milkshake  Buttered or Alfredo noodles Custard  Cooked cooled vegetable Puddings, including tapioca  Sherbet Yogurt  Vegetable soup or alphabet soup Fruit ice, [place] ice  Gravies Whipped gelatin  Sugar, syrup, honey, jelly Junior baby desserts  Sauces:  Cheese, creamed, barbecue, tomato, white Cream  Coffee or tea Margarine   SAMPLE MENU:  LEVEL 2 Breakfast Lunch Dinner   Orange juice, 1/2 cup  Oatmeal, 1/2 cup  Scrambled eggs with cheese, 1/2 cup  Decaffeinated tea, 1 cup  Whole milk, 1 cup  Non-dairy creamer, 2 Tbsp  Pineapple juice, 1/2 cup  Minced beef, 3 oz  Gravy, 2 Tbsp  Mashed potatoes, 1/2 cup  Minced fresh broccoli, 1/2 cup  Applesauce, 1/2 cup  Coffee, 1 cup  [place], barley soup, 3/4 cup  Minced Hawaiian chicken, 3 oz  Mashed potatoes, 1/2 cup  Cooked spinach, 1/2 cup  Frozen yogurt, 1/2 cup  Non-dairy creamer, 2 Tbsp      LEVEL 3 = CHOPPED DIET  -After all the foods in level 2 (soft diet) are passing through well you should advance up to more chopped foods.  -It is still important to cut these foods into small pieces and eat slowly.  Hot Foods Cold Foods  Poultry Cottage cheese  Chopped Swedish meatballs Yogurt  Meat salads (ground or flaked meat) Milk  Flaked fish (tuna) Milkshakes  Poached or scrambled eggs Soft, cold, dry cereal  Souffles and omelets Fruit juices or nectars  Cooked cereals Chopped canned fruit  Chopped [place] toast or pancakes Canned fruit cocktail  Noodles or pasta (no rice) Pudding, mousse, custard  Cooked  vegetables (no frozen peas, corn, or mixed vegetables) Green salad  Canned small sweet peas Ice cream  Creamed soup or vegetable soup Fruit ice, [place] ice  Pureed vegetable soup or alphabet soup Non-dairy creamer  Ground scalloped apples Margarine  Gravies Mayonnaise  Sauces:  Cheese, creamed, barbecue, tomato, white Ketchup  Coffee or tea Mustard   SAMPLE MENU:  LEVEL 3 Breakfast Lunch Dinner   Orange juice, 1/2 cup  Oatmeal, 1/2 cup  Scrambled eggs with cheese, 1/2 cup  Decaffeinated tea, 1 cup  Whole milk, 1 cup  Non-dairy creamer, 2 Tbsp  Ketchup, 1 Tbsp  Margarine, 1 tsp  Salt, 1/4 tsp  Sugar, 2 tsp  Pineapple juice, 1/2 cup  Ground beef, 3 oz  Gravy, 2 Tbsp  Mashed potatoes, 1/2 cup  Cooked spinach, 1/2 cup  Applesauce, 1/2 cup  Decaffeinated coffee  Whole milk  Non-dairy creamer, 2 Tbsp  Margarine, 1 tsp  Salt, 1/4 tsp  Pureed [place], barley soup, 3/4 cup  Barbecue chicken, 3 oz  Mashed potatoes, 1/2 cup  Ground fresh broccoli, 1/2 cup  Frozen yogurt, 1/2 cup  Decaffeinated tea, 1 cup  Non-dairy creamer, 2 Tbsp  Margarine, 1 tsp  Salt, 1/4 tsp  Sugar, 1 tsp    LEVEL 4:  REGULAR FOODS  -Foods in this group are soft, moist, regularly textured foods.   -This level includes meat and breads, which tend to be the hardest things to swallow.   -Eat very slowly, chew well and continue to avoid carbonated drinks. -most people are at this level in 2-4 weeks  Hot Foods Cold Foods  Baked fish or skinned Soft cheeses - cottage cheese  Souffles and omelets Cream cheese  Eggs Yogurt  Stuffed shells Milk  Spaghetti with meat sauce Milkshakes  Cooked cereal Cold dry cereals (no nuts, dried fruit, coconut)  [place] toast or pancakes Crackers  Buttered toast Fruit juices or nectars  Noodles or pasta (no rice) Canned fruit  Potatoes (all types) Ripe bananas  Soft, cooked vegetables (no corn, lima, or baked beans) Peeled, ripe, fresh fruit    Creamed soups or vegetable soup Cakes (no nuts, dried fruit, coconut)  Canned chicken noodle soup Plain doughnuts  Gravies Ice cream  Bacon dressing Pudding, mousse, custard  Sauces:  Cheese, creamed, barbecue, tomato, white Fruit ice, [place] ice, sherbet  Decaffeinated tea or coffee Whipped gelatin  Pork chops Regular gelatin   Canned fruited gelatin molds   Sugar, syrup, honey, jam, jelly   Cream   Non-dairy   Margarine   Oil   Mayonnaise   Ketchup   Mustard   TROUBLESHOOTING IRREGULAR BOWELS  1) Avoid extremes of bowel movements (no bad constipation/diarrhea)  2) Miralax 17gm mixed in 8oz. water or juice-daily. May  use BID as needed.  3) Gas-x,Phazyme, etc. as needed for gas & bloating.  4) Soft,bland diet. No spicy,greasy,fried foods.  5) Prilosec over-the-counter as needed  6) May hold gluten/wheat products from diet to see if symptoms improve.  7) May try probiotics (Align, Activa, etc) to help calm the bowels down  7) If symptoms become worse call back immediately.    If you have any questions please call our office at [place]: [phone].   Small Bowel Obstruction A small bowel obstruction is a blockage in the small bowel. The small bowel, which is also called the small intestine, is a long, slender tube that connects the stomach to the colon. When a person eats and drinks, food and fluids go from the stomach to the small bowel. This is where most of the nutrients in the food and fluids are absorbed. A small bowel obstruction will prevent food and fluids from passing through the small bowel as they normally do during digestion. The small bowel can become partially or completely blocked. This can cause symptoms such as abdominal pain, vomiting, and bloating. If this condition is not treated, it can be dangerous because the small bowel could rupture. CAUSES Common causes of this condition include: 2. Scar tissue from previous surgery or radiation  treatment. 3. Recent surgery. This may cause the movements of the bowel to slow down and cause food to block the intestine. 4. Hernias. 5. Inflammatory bowel disease (colitis). 6. Twisting of the bowel (volvulus). 7. Tumors. 8. A foreign body. 9. Slipping of a part of the bowel into another part (intussusception). SYMPTOMS Symptoms of this condition include: 2. Abdominal pain. This may be dull cramps or sharp pain. It may occur in one area, or it may be present in the entire abdomen. Pain can range from mild to severe, depending on the degree of obstruction. 3. Nausea and vomiting. Vomit may be greenish or a yellow bile color. 4. Abdominal bloating. 5. Constipation. 6. Lack of passing gas. 7. Frequent belching. 8. Diarrhea. This may occur if the obstruction is partial and runny stool is able to leak around the obstruction. DIAGNOSIS This condition may be diagnosed based on a physical exam, medical history, and X-rays of the abdomen. You may also have other tests, such as a CT scan of the abdomen and pelvis. TREATMENT Treatment for this condition depends on the cause and severity of the problem. Treatment options may include: 2. Bed rest along with fluids and pain medicines that are given through an IV tube inserted into one of your veins. Sometimes, this is all that is needed for the obstruction to improve. 3. Following a simple diet. In some cases, a clear liquid diet may be required for several days. This allows the bowel to rest. 4. Placement of a small tube (nasogastric tube) into the stomach. When the bowel is blocked, it usually swells up like a balloon that is filled with air and fluids. The air and fluids may be removed by suction through the nasogastric tube. This can help with pain, discomfort, and nausea. It can also help the obstruction to clear up faster. 5. Surgery. This may be required if other treatments do not work. Bowel obstruction from a hernia may require early surgery  and can be an emergency procedure. Surgery may also be required for scar tissue that causes frequent or severe obstructions. HOME CARE INSTRUCTIONS  Get plenty of rest.  Follow instructions from your health care provider about eating restrictions. You may  need to avoid solid foods and consume only clear liquids until your condition improves.  Take over-the-counter and prescription medicines only as told by your health care provider.  Keep all follow-up visits as told by your health care provider. This is important. SEEK MEDICAL CARE IF: 2. You have a fever. 3. You have chills. SEEK IMMEDIATE MEDICAL CARE IF: 2. You have increased pain or cramping. 3. You vomit blood. 4. You have uncontrolled vomiting or nausea. 5. You cannot drink fluids because of vomiting or pain. 6. You develop confusion. 7. You begin feeling very dry or thirsty (dehydrated). 8. You have severe bloating. 9. You feel extremely weak or you faint.   This information is not intended to replace advice given to you by your health care provider. Make sure you discuss any questions you have with your health care provider.    DIVERTICULITIS INSTRUCTIONS  ######################################################################  EAT Gradually transition to a high fiber diet with a fiber supplement over the next few days after discharge  WALK Walk an hour a day.  Control your pain to do that.    CONTROL PAIN Control pain so that you can walk, sleep, tolerate sneezing/coughing, go up/down stairs.  HAVE A BOWEL MOVEMENT DAILY Keep your bowels regular to avoid problems.  OK to try a laxative to override constipation.  OK to use an antidairrheal to slow down diarrhea.  Call if not better after 2 tries  CALL IF YOU HAVE PROBLEMS/CONCERNS Call if you are still struggling despite following these instructions. Call if you have concerns not answered by these  instructions  ######################################################################   DIET Follow a light diet the first few days at home.  Start with a bland diet such as soups, liquids, starchy foods, low fat foods, etc.  If you feel full, bloated, or constipated, stay on a ful liquid or pureed/blenderized diet for a few days until you feel better and no longer constipated. Be sure to drink plenty of fluids every day to avoid getting dehydrated (feeling dizzy, not urinating, etc.). Gradually add a fiber supplement to your diet over the next week.  Gradually get back to a regular solid diet.  Avoid fast food or heavy meals the first week as you are more likely to get nauseated. It is expected for your digestive tract to need a few months to get back to normal.  It is common for your bowel movements and stools to be irregular.  You will have occasional bloating and cramping that should eventually fade away.  Until you are eating solid food normally, off all pain medications, and back to regular activities; your bowels will not be normal. Focus on eating a low-fat, high fiber diet the rest of your life (See Getting to [place], below).  CARE of your INCISION or WOUND It is good for closed incision and even open wounds to be washed every day.  Shower every day.  Short baths are fine.  Wash the incisions and wounds clean with soap & water.    If you have a closed incision(s), wash the incision with soap & water every day.  You may leave closed incisions open to air if it is dry.   You may cover the incision with clean gauze & replace it after your daily shower for comfort. If you have skin tapes (Steristrips) or skin glue (Dermabond) on your incision, leave them in place.  They will fall off on their own like a scab.  You may trim any edges  that curl up with clean scissors.  If you have staples, set up an appointment for them to be removed in the office in 10 days after surgery.  If you have a  drain, wash around the skin exit site with soap & water and place a new dressing of gauze or band aid around the skin every day.  Keep the drain site clean & dry.    If you have an open wound with packing, see wound care instructions.  In general, it is encouraged that you remove your dressing and packing, shower with soap & water, and replace your dressing once a day.  Pack the wound with clean gauze moistened with normal (0.9%) saline to keep the wound moist & uninfected.  Pressure on the dressing for 30 minutes will stop most wound bleeding.  Eventually your body will heal & pull the open wound closed over the next few months.  Raw open wounds will occasionally bleed or secrete yellow drainage until it heals closed.  Drain sites will drain a little until the drain is removed.  Even closed incisions can have mild bleeding or drainage the first few days until the skin edges scab over & seal.   If you have an open wound with a wound vac, see wound vac care instructions.     ACTIVITIES as tolerated Start light daily activities --- self-care, walking, climbing stairs-- beginning the day after surgery.  Gradually increase activities as tolerated.  Control your pain to be active.  Stop when you are tired.  Ideally, walk several times a day, eventually an hour a day.   Most people are back to most day-to-day activities in a few weeks.  It takes 4-8 weeks to get back to unrestricted, intense activity. If you can walk 30 minutes without difficulty, it is safe to try more intense activity such as jogging, treadmill, bicycling, low-impact aerobics, swimming, etc. Save the most intensive and strenuous activity for last (Usually 4-8 weeks after surgery) such as sit-ups, heavy lifting, contact sports, etc.  Refrain from any intense heavy lifting or straining until you are off narcotics for pain control.  You will have off days, but things should improve week-by-week. DO NOT PUSH THROUGH PAIN.  Let pain be your  guide: If it hurts to do something, don't do it.  Pain is your body warning you to avoid that activity for another week until the pain goes down. You may drive when you are no longer taking narcotic prescription pain medication, you can comfortably wear a seatbelt, and you can safely make sudden turns/stops to protect yourself without hesitating due to pain. You may have sexual intercourse when it is comfortable. If it hurts to do something, stop.  MEDICATIONS Take your usually prescribed home medications unless otherwise directed.   Blood thinners:  Usually you can restart any strong blood thinners after the second postoperative day.  It is OK to take aspirin right away.     If you are on strong blood thinners (warfarin/Coumadin, Plavix, Xerelto, Eliquis, Pradaxa, etc), discuss with your surgeon, medicine PCP, and/or cardiologist for instructions on when to restart the blood thinner & if blood monitoring is needed (PT/INR blood check, etc).     PAIN CONTROL Pain after surgery or related to activity is often due to strain/injury to muscle, tendon, nerves and/or incisions.  This pain is usually short-term and will improve in a few months.  To help speed the process of healing and to get back to regular activity  more quickly, DO THE FOLLOWING THINGS TOGETHER: 1. Increase activity gradually.  DO NOT PUSH THROUGH PAIN 2. Use Ice and/or Heat 3. Try Gentle Massage and/or Stretching 4. Take over the counter pain medication 5. Take Narcotic prescription pain medication for more severe pain  Good pain control = faster recovery.  It is better to take more medicine to be more active than to stay in bed all day to avoid medications. 1.  Increase activity gradually Avoid heavy lifting at first, then increase to lifting as tolerated over the next 6 weeks. Do not push through the pain.  Listen to your body and avoid positions and maneuvers than reproduce the pain.  Wait a few days before trying something  more intense Walking an hour a day is encouraged to help your body recover faster and more safely.  Start slowly and stop when getting sore.  If you can walk 30 minutes without stopping or pain, you can try more intense activity (running, jogging, aerobics, cycling, swimming, treadmill, sex, sports, weightlifting, etc.) Remember: If it hurts to do it, then dont do it! 2. Use Ice and/or Heat You will have swelling and bruising around the incisions.  This will take several weeks to resolve. Ice packs or heating pads (6-8 times a day, 30-60 minutes at a time) will help sooth soreness & bruising. Some people prefer to use ice alone, heat alone, or alternate between ice & heat.  Experiment and see what works best for you.  Consider trying ice for the first few days to help decrease swelling and bruising; then, switch to heat to help relax sore spots and speed recovery. Shower every day.  Short baths are fine.  It feels good!  Keep the incisions and wounds clean with soap & water.   3. Try Gentle Massage and/or Stretching Massage at the area of pain many times a day Stop if you feel pain - do not overdo it 4. Take over the counter pain medication This helps the muscle and nerve tissues become less irritable and calm down faster Choose ONE of the following over-the-counter anti-inflammatory medications: Acetaminophen 500mg  tabs (Tylenol) 1-2 pills with every meal and just before bedtime (avoid if you have liver problems or if you have acetaminophen in you narcotic prescription) Naproxen 220mg  tabs (ex. Aleve, Naprosyn) 1-2 pills twice a day (avoid if you have kidney, stomach, IBD, or bleeding problems) Ibuprofen 200mg  tabs (ex. Advil, Motrin) 3-4 pills with every meal and just before bedtime (avoid if you have kidney, stomach, IBD, or bleeding problems) Take with food/snack several times a day as directed for at least 2 weeks to help keep pain / soreness down & more manageable. 5. Take Narcotic  prescription pain medication for more severe pain A prescription for strong pain control is often given to you upon discharge (for example: oxycodone/Percocet, hydrocodone/Norco/Vicodin, or tramadol/Ultram) Take your pain medication as prescribed. Be mindful that most narcotic prescriptions contain Tylenol (acetaminophen) as well - avoid taking too much Tylenol. If you are having problems/concerns with the prescription medicine (does not control pain, nausea, vomiting, rash, itching, etc.), please call us [phone] to see if we need to switch you to a different pain medicine that will work better for you and/or control your side effects better. If you need a refill on your pain medication, you must call the office before 4 pm and on weekdays only.  By federal law, prescriptions for narcotics cannot be called into a pharmacy.  They must be filled out  on paper & picked up from our office by the patient or authorized caretaker.  Prescriptions cannot be filled after 4 pm nor on weekends.    WHEN TO CALL us [phone] Severe uncontrolled or worsening pain  Fever over 101 F (38.5 C) Concerns with the incision: Worsening pain, redness, rash/hives, swelling, bleeding, or drainage Reactions / problems with new medications (itching, rash, hives, nausea, etc.) Nausea and/or vomiting Difficulty urinating Difficulty breathing Worsening fatigue, dizziness, lightheadedness, blurred vision Other concerns If you are not getting better after two weeks or are noticing you are getting worse, contact our office [phone] for further advice.  We may need to adjust your medications, re-evaluate you in the office, send you to the emergency room, or see what other things we can do to help. The clinic staff is available to answer your questions during regular business hours (8:30am-5pm).  Please dont hesitate to call and ask to speak to one of our nurses for clinical concerns.    A surgeon from [facility] is always on call at the hospitals 24 hours/day If you have a medical emergency, go to the nearest emergency room or call 911.  FOLLOW UP in our office One the day of your discharge from the hospital (or the next business weekday), please call [facility] to set up or confirm an appointment to see your surgeon in the office for a follow-up appointment.  Usually it is 2-3 weeks after your surgery.   If you have skin staples at your incision(s), let the office know so we can set up a time in the office for the nurse to remove them (usually around 10 days after surgery). Make sure that you call for appointments the day of discharge (or the next business weekday) from the hospital to ensure a convenient appointment time. IF YOU HAVE DISABILITY OR FAMILY LEAVE FORMS, BRING THEM TO THE OFFICE FOR PROCESSING.  DO NOT GIVE THEM TO YOUR DOCTOR.  [facility] [address] [place], [place], [place]  [address] ? [phone] - Main [phone] - [place],  [phone] - Fax [email or web address]  GETTING TO GOOD BOWEL HEALTH. It is expected for your digestive tract to need a few months to get back to normal.  It is common for your bowel movements and stools to be irregular.  You will have occasional bloating and cramping that should eventually fade away.  Until you are eating solid food normally, off all pain medications, and back to regular activities; your bowels will not be normal.   Avoiding constipation The goal: ONE SOFT BOWEL MOVEMENT A DAY!    Drink plenty of fluids.  Choose water first. TAKE A FIBER SUPPLEMENT EVERY DAY THE REST OF YOUR LIFE During your first week back home, gradually add back a fiber supplement every day Experiment which form you can tolerate.   There are many forms such as powders, tablets, wafers, gummies, etc Psyllium bran (Metamucil), methylcellulose (Citrucel), Miralax or Glycolax, Benefiber, Flax  Seed.  Adjust the dose week-by-week (1/2 dose/day to 6 doses a day) until you are moving your bowels 1-2 times a day.  Cut back the dose or try a different fiber product if it is giving you problems such as diarrhea or bloating. Sometimes a laxative is needed to help jump-start bowels if constipated until the fiber supplement can help regulate your bowels.  If you are tolerating eating & you are farting, it is okay to try a  gentle laxative such as double dose MiraLax, prune juice, or Milk of Magnesia.  Avoid using laxatives too often. Stool softeners can sometimes help counteract the constipating effects of narcotic pain medicines.  It can also cause diarrhea, so avoid using for too long. If you are still constipated despite taking fiber daily, eating solids, and a few doses of laxatives, call our office. Controlling diarrhea Try drinking liquids and eating bland foods for a few days to avoid stressing your intestines further. Avoid dairy products (especially milk & ice cream) for a short time.  The intestines often can lose the ability to digest lactose when stressed. Avoid foods that cause gassiness or bloating.  Typical foods include beans and other legumes, cabbage, broccoli, and dairy foods.  Avoid greasy, spicy, fast foods.  Every person has some sensitivity to other foods, so listen to your body and avoid those foods that trigger problems for you. Probiotics (such as active yogurt, Align, etc) may help repopulate the intestines and colon with normal bacteria and calm down a sensitive digestive tract Adding a fiber supplement gradually can help thicken stools by absorbing excess fluid and retrain the intestines to act more normally.  Slowly increase the dose over a few weeks.  Too much fiber too soon can backfire and cause cramping & bloating. It is okay to try and slow down diarrhea with a few doses of antidiarrheal medicines.   Bismuth subsalicylate (ex. Kayopectate, Pepto Bismol) for a few doses  can help control diarrhea.  Avoid if pregnant.   Loperamide (Imodium) can slow down diarrhea.  Start with one tablet (2mg ) first.  Avoid if you are having fevers or severe pain.  ILEOSTOMY PATIENTS WILL HAVE CHRONIC DIARRHEA since their colon is not in use.    Drink plenty of liquids.  You will need to drink even more glasses of water/liquid a day to avoid getting dehydrated. Record output from your ileostomy.  Expect to empty the bag every 3-4 hours at first.  Most people with a permanent ileostomy empty their bag 4-6 times at the least.   Use antidiarrheal medicine (especially Imodium) several times a day to avoid getting dehydrated.  Start with a dose at bedtime & breakfast.  Adjust up or down as needed.  Increase antidiarrheal medications as directed to avoid emptying the bag more than 8 times a day (every 3 hours). Work with your wound ostomy nurse to learn care for your ostomy.  See ostomy care instructions. TROUBLESHOOTING IRREGULAR BOWELS 1) Start with a soft & bland diet. No spicy, greasy, or fried foods.  2) Avoid gluten/wheat or dairy products from diet to see if symptoms improve. 3) Miralax 17gm or flax seed mixed in [place]. water or juice-daily. May use 2-4 times a day as needed. 4) Gas-X, Phazyme, etc. as needed for gas & bloating.  5) Prilosec (omeprazole) over-the-counter as needed 6)  Consider probiotics (Align, Activa, etc) to help calm the bowels down  Call your doctor if you are getting worse or not getting better.  Sometimes further testing (cultures, endoscopy, X-ray studies, CT scans, bloodwork, etc.) may be needed to help diagnose and treat the cause of the diarrhea. [facility], [place], [place], [place], [place]  [address] [phone] - Main.    [phone]  - Toll Free.   [phone] - Fax [email or web address]    Diverticulitis Diverticulitis is infection or inflammation of small pouches (diverticula) in the colon that  form due to a condition called diverticulosis.  Diverticula can trap stool (feces) and bacteria, causing infection and inflammation. Diverticulitis may cause severe stomach pain and diarrhea. It may lead to tissue damage in the colon that causes bleeding. The diverticula may also burst (rupture) and cause infected stool to enter other areas of the abdomen. Complications of diverticulitis can include:  Bleeding.  Severe infection.  Severe pain.  Rupture (perforation) of the colon.  Blockage (obstruction) of the colon.  What are the causes? This condition is caused by stool becoming trapped in the diverticula, which allows bacteria to grow in the diverticula. This leads to inflammation and infection. What increases the risk? You are more likely to develop this condition if:  You have diverticulosis. The risk for diverticulosis increases if: ? You are overweight or obese. ? You use tobacco products. ? You do not get enough exercise.  You eat a diet that does not include enough fiber. High-fiber foods include fruits, vegetables, beans, nuts, and whole grains.  What are the signs or symptoms? Symptoms of this condition may include:  Pain and tenderness in the abdomen. The pain is normally located on the left side of the abdomen, but it may occur in other areas.  Fever and chills.  Bloating.  Cramping.  Nausea.  Vomiting.  Changes in bowel routines.  Blood in your stool.  How is this diagnosed? This condition is diagnosed based on:  Your medical history.  A physical exam.  Tests to make sure there is nothing else causing your condition. These tests may include: ? Blood tests. ? Urine tests. ? Imaging tests of the abdomen, including X-rays, ultrasounds, MRIs, or CT scans.  How is this treated? Most cases of this condition are mild and can be treated at home. Treatment may include:  Taking over-the-counter pain medicines.  Following a clear liquid diet.  Taking  antibiotic medicines by mouth.  Rest.  More severe cases may need to be treated at a hospital. Treatment may include:  Not eating or drinking.  Taking prescription pain medicine.  Receiving antibiotic medicines through an IV tube.  Receiving fluids and nutrition through an IV tube.  Surgery.  When your condition is under control, your health care provider may recommend that you have a colonoscopy. This is an exam to look at the entire large intestine. During the exam, a lubricated, bendable tube is inserted into the anus and then passed into the rectum, colon, and other parts of the large intestine. A colonoscopy can show how severe your diverticula are and whether something else may be causing your symptoms. Follow these instructions at home: Medicines  Take over-the-counter and prescription medicines only as told by your health care provider. These include fiber supplements, probiotics, and stool softeners.  If you were prescribed an antibiotic medicine, take it as told by your health care provider. Do not stop taking the antibiotic even if you start to feel better.  Do not drive or use heavy machinery while taking prescription pain medicine. General instructions  Follow a full liquid diet or another diet as directed by your health care provider. After your symptoms improve, your health care provider may tell you to change your diet. He or she may recommend that you eat a diet that contains at least 25 g (25 grams) of fiber daily. Fiber makes it easier to pass stool. Healthy sources of fiber include: ? Berries. One cup contains 4-8 grams of fiber. ? Beans or lentils. One half cup contains 5-8 grams of fiber. ? Green vegetables. One  cup contains 4 grams of fiber.  Exercise for at least 30 minutes, 3 times each week. You should exercise hard enough to raise your heart rate and break a sweat.  Keep all follow-up visits as told by your health care provider. This is important. You may  need a colonoscopy. Contact a health care provider if:  Your pain does not improve.  You have a hard time drinking or eating food.  Your bowel movements do not return to normal. Get help right away if:  Your pain gets worse.  Your symptoms do not get better with treatment.  Your symptoms suddenly get worse.  You have a fever.  You vomit more than one time.  You have stools that are bloody, black, or tarry. Summary  Diverticulitis is infection or inflammation of small pouches (diverticula) in the colon that form due to a condition called diverticulosis. Diverticula can trap stool (feces) and bacteria, causing infection and inflammation.  You are at higher risk for this condition if you have diverticulosis and you eat a diet that does not include enough fiber.  Most cases of this condition are mild and can be treated at home. More severe cases may need to be treated at a hospital.  When your condition is under control, your health care provider may recommend that you have an exam called a colonoscopy. This exam can show how severe your diverticula are and whether something else may be causing your symptoms. This information is not intended to replace advice given to you by your health care provider. Make sure you discuss any questions you have with your health care provider. Document Released: 05/28/2005 Document Revised: 09/20/2016 Document Reviewed: 09/20/2016 Elsevier Interactive Patient Education  [employer].

## 2018-07-20 LAB — AEROBIC/ANAEROBIC CULTURE W GRAM STAIN (SURGICAL/DEEP WOUND)

## 2018-07-20 LAB — AEROBIC/ANAEROBIC CULTURE (SURGICAL/DEEP WOUND)

## 2018-07-26 ENCOUNTER — Other Ambulatory Visit: Payer: Self-pay | Admitting: Surgery

## 2018-07-26 DIAGNOSIS — K651 Peritoneal abscess: Secondary | ICD-10-CM

## 2018-07-28 ENCOUNTER — Ambulatory Visit
Admission: RE | Admit: 2018-07-28 | Discharge: 2018-07-28 | Disposition: A | Discharge status: Home / Self Care | Payer: BLUE CROSS/BLUE SHIELD | Source: Ambulatory Visit | Attending: Surgery | Admitting: Surgery

## 2018-07-28 ENCOUNTER — Encounter: Payer: Self-pay | Admitting: Radiology

## 2018-07-28 ENCOUNTER — Other Ambulatory Visit: Payer: Self-pay | Admitting: Surgery

## 2018-07-28 DIAGNOSIS — K651 Peritoneal abscess: Secondary | ICD-10-CM

## 2018-07-28 HISTORY — PX: IR RADIOLOGIST EVAL & MGMT: IMG5224

## 2018-07-28 MED ORDER — IOHEXOL 300 MG/ML  SOLN
100.0000 mL | Freq: Once | INTRAMUSCULAR | Status: AC | PRN
  Administered 2018-07-28: 100 mL via INTRAVENOUS

## 2018-07-28 NOTE — Progress Notes (Signed)
Patient ID: Douglas Weber, male   DOB: Dec 25, 1970, 47 y.o.   MRN: [id number]       Chief Complaint:   Diverticular abscess, status post percutaneous drain  Referring Physician(s): Gross,Steven  History of Present Illness: Douglas Weber is a 47 y.o. male with diverticulitis, gated by left lower quadrant pelvic abscess.  He is status post pertains drain from 07/15/2018.  He returns for outpatient follow-up with imaging.  Over the last 2 weeks he has been recovering at home.  No interval fevers.  Improvement in his abdominal pain.  Stable weight and improving appetite.  Normal bowel habits.  He reports minimal output from the drain catheter in the last 2 to 3 days which remains to suction bulb.  He remains on oral antibiotics.  No past medical history on file.  Past Surgical History:  Procedure Laterality Date  . IR RADIOLOGIST EVAL & MGMT  07/28/2018    Allergies: Patient has no known allergies.  Medications: Prior to Admission medications   Medication Sig Start Date End Date Taking? Authorizing Provider  acetaminophen (TYLENOL) 325 MG tablet Take 650 mg by mouth every 6 (six) hours as needed for fever.    [provider]  amoxicillin-clavulanate (AUGMENTIN) 875-125 MG tablet Take 1 tablet by mouth 2 (two) times daily. 07/18/18   Shresta Risden Boston, MD     No family history on file.  Social History   Socioeconomic History  . Marital status: Married    Spouse name: Not on file  . Number of children: Not on file  . Years of education: Not on file  . Highest education level: Not on file  Occupational History  . Not on file  Social Needs  . Financial resource strain: Not on file  . Food insecurity:    Worry: Not on file    Inability: Not on file  . Transportation needs:    Medical: Not on file    Non-medical: Not on file  Tobacco Use  . Smoking status: Current Some Day Smoker  . Smokeless tobacco: Never Used  Substance and Sexual Activity  . Alcohol use: Yes   Alcohol/week: 1.0 standard drinks    Types: 1 Glasses of wine per week  . Drug use: Yes    Types: Marijuana    Comment: unsure  . Sexual activity: Not on file  Lifestyle  . Physical activity:    Days per week: Not on file    Minutes per session: Not on file  . Stress: Not on file  Relationships  . Social connections:    Talks on phone: Not on file    Gets together: Not on file    Attends religious service: Not on file    Active member of club or organization: Not on file    Attends meetings of clubs or organizations: Not on file    Relationship status: Not on file  Other Topics Concern  . Not on file  Social History Narrative  . Not on file      Review of Systems: A 12 point ROS discussed and pertinent positives are indicated in the HPI above.  All other systems are negative.  Review of Systems  Vital Signs: There were no vitals taken for this visit.  Physical Exam  Constitutional: He appears well-developed and well-nourished. No distress.  Eyes: Conjunctivae are normal. No scleral icterus.  Abdominal: Soft. Bowel sounds are normal.  Left lower quadrant drain catheter site is clean, dry and intact.  Skin: He  is not diaphoretic.     Imaging: Dg Chest 2 View  Result Date: 07/14/2018 CLINICAL DATA:  Fever. EXAM: CHEST - 2 VIEW COMPARISON:  None. FINDINGS: The heart size and mediastinal contours are within normal limits. No pneumothorax is noted. Mild left basilar atelectasis or infiltrate is noted with possible small pleural effusion. Minimal right lateral basilar subsegmental atelectasis is noted. The visualized skeletal structures are unremarkable. IMPRESSION: Mild left basilar atelectasis or infiltrate is noted with possible small pleural effusion. Minimal right basilar subsegmental atelectasis is noted laterally. Electronically Signed   By: Marijo Conception, M.D.   On: 07/14/2018 15:22   Ct Abdomen Pelvis W Contrast  Result Date: 07/28/2018 CLINICAL DATA:   Diverticular abscess, status post percutaneous drain 07/15/2018 EXAM: CT ABDOMEN AND PELVIS WITH CONTRAST TECHNIQUE: Multidetector CT imaging of the abdomen and pelvis was performed using the standard protocol following bolus administration of intravenous contrast. CONTRAST:  122mL OMNIPAQUE IOHEXOL 300 MG/ML  SOLN COMPARISON:  07/14/2018 FINDINGS: Lower chest: Small pleural effusions noted layering posteriorly with minor dependent basilar atelectasis. Normal heart size. No pericardial effusion. No hiatal hernia. Hepatobiliary: No focal liver abnormality is seen. No gallstones, gallbladder wall thickening, or biliary dilatation. Pancreas: Unremarkable. No pancreatic ductal dilatation or surrounding inflammatory changes. Spleen: Spleen is normal in size. No parenchymal abnormality. Trace anteromedial perisplenic fluid, unchanged image 24. Adrenals/Urinary Tract: Normal adrenal glands. No renal obstruction, hydronephrosis, or obstructing ureteral calculus. Ureters are symmetric and decompressed bilaterally. No bladder abnormality. Punctate nonobstructing left intrarenal calculi noted measuring 4 mm in the lower pole. Stomach/Bowel: Negative for bowel obstruction, significant dilatation, ileus, free air. Normal appendix. Improving left lower quadrant sigmoid diverticulitis. There is less pericolonic inflammation and edema. Sigmoid wall thickening remains. Stable left lower quadrant collapsed pericolonic air collection. No residual fluid component. Stable drain catheter position. Residual left upper quadrant perisplenic/splenic flexure slender complex loculated enhancing fluid collection, largest loculation measures 1.8 cm, image 39 series 2. This collection is stable to minimally larger than 07/14/2018 and is separate from the left lower quadrant drain catheter site. Vascular/Lymphatic: Aortoiliac atherosclerosis noted. Negative for aneurysm or acute vascular process. No occlusive disease. No adenopathy. No  veno-occlusive process. Reproductive: Unremarkable by CT. Other: Intact abdominal wall.  No hernia.  No inguinal abnormality. Musculoskeletal: No acute osseous finding. IMPRESSION: Resolved left lower quadrant large dominant abscess, status post percutaneous drain. Small collapsed pericolonic air collection remains but no residual fluid component in this region. Improvement in the left lower quadrant sigmoid diverticulitis. Separate left upper quadrant slender loculated complex enhancing fluid collection about the spleen and left pericolic gutter, largest component measures 1.8 cm. This collection remains indeterminate recommend attention on subsequent follow-up imaging in 2 weeks and if continued enlargement, consider aspiration versus drainage. Small pleural effusions and basilar atelectasis Electronically Signed   By: Jerilynn Mages.  Eugenie Harewood M.D.   On: 07/28/2018 13:03   Ct Abdomen Pelvis W Contrast  Result Date: 07/14/2018 CLINICAL DATA:  Left flank pain with nausea and vomiting for 2 days. Sigmoid diverticulitis diagnosed on the prior exam. EXAM: CT ABDOMEN AND PELVIS WITH CONTRAST TECHNIQUE: Multidetector CT imaging of the abdomen and pelvis was performed using the standard protocol following bolus administration of intravenous contrast. CONTRAST:  166mL OMNIPAQUE IOHEXOL 300 MG/ML  SOLN COMPARISON:  07/10/2018. FINDINGS: Lower chest: Small, left greater than right, pleural effusions. Minor dependent lower lobe atelectasis, also greater on the left. Heart normal size. Hepatobiliary: No focal liver abnormality is seen. No gallstones, gallbladder wall thickening, or biliary dilatation.  Pancreas: Unremarkable. No pancreatic ductal dilatation or surrounding inflammatory changes. Spleen: Normal in size without focal abnormality. Adrenals/Urinary Tract: No adrenal masses. Kidneys normal size, orientation and position with symmetric enhancement and excretion. Small stone noted in the lower pole of the left kidney. No renal  masses. No hydronephrosis. Normal ureters. Normal bladder. Stomach/Bowel: There is thickening of the wall of the sigmoid colon where there are several diverticula. There is surrounding inflammation. A tract extends from the posterior aspect of the affected sigmoid colon to connect to a small collection that contains a small amount of contrast and small bubbles of air. This extends laterally to connect to a large collection in the left lower quadrant and pelvis containing fluid and air. This collection measures 15 x 6 x 12 cm. There are additional small collections, in the anterior pelvis, anterior and slightly superior to the involved sigmoid colon, measuring 2.9 x 1.1 cm, as well as a thin collection, which extends along the lateral aspect the peritoneal cavity, measuring 12 mm in greatest thickness. These collections have developed since the prior CT. Previously seen free air has mostly resolved. No other areas of colonic inflammation. Small bowel lies adjacent to the pelvic and left lower quadrant collections. Mild fold thickening is evident with the bowel is in close approximation to the collections consistent with reactive edema. Small bowel is normal in caliber its entirety. No other wall fold thickening. Normal appendix visualized. Vascular/Lymphatic: No pathologically enlarged lymph nodes. Mild aortic atherosclerosis. Reproductive: Unremarkable. Other: No hernia. Trace free fluid most evident adjacent to the spleen. Musculoskeletal: No acute or significant osseous findings. IMPRESSION: 1. There has been interval worsening since the prior CT. New abscesses have developed extending from the perforation along the posterior margin of the inflamed sigmoid colon. Largest collection is in the left pelvis/left lower quadrant, measuring 15 x 6 x 12 cm. Inflammatory changes along the sigmoid colon are similar in severity. Most of the free intraperitoneal air has resorbed or organized into the new collections.  Electronically Signed   By: Lajean Manes M.D.   On: 07/14/2018 15:08   Ct Abdomen Pelvis W Contrast  Result Date: 07/10/2018 CLINICAL DATA:  46 year old male with generalize abdominal pain and nausea since 10 a.m. EXAM: CT ABDOMEN AND PELVIS WITH CONTRAST TECHNIQUE: Multidetector CT imaging of the abdomen and pelvis was performed using the standard protocol following bolus administration of intravenous contrast. CONTRAST:  183mL OMNIPAQUE IOHEXOL 300 MG/ML  SOLN COMPARISON:  No priors. FINDINGS: Lower chest: There are no aggressive appearing lytic or blastic lesions noted in the visualized portions of the skeleton. Hepatobiliary: No suspicious cystic or solid hepatic lesions. No intra or extrahepatic biliary ductal dilatation. Gallbladder is normal in appearance. Pancreas: No pancreatic mass. No pancreatic ductal dilatation. No pancreatic or peripancreatic fluid or inflammatory changes. Spleen: Unremarkable. Adrenals/Urinary Tract: Bilateral kidneys and adrenal glands are normal in appearance. No hydroureteronephrosis. Urinary bladder is normal in appearance. Stomach/Bowel: Normal appearance of the stomach. No pathologic dilatation of small bowel or colon. Numerous colonic diverticulae are noted. In the region of the mid sigmoid colon there are extensive inflammatory changes adjacent to several diverticulae where there is also extensive mural thickening, and a small amount of extraluminal gas extending into the sigmoid mesocolon, compatible with an acute perforated diverticulitis. Appendix is not confidently identified may be surgically absent. Vascular/Lymphatic: Aortic atherosclerosis, without evidence of aneurysm or dissection in the abdominal or pelvic vasculature. Circumaortic left renal vein (normal anatomical variant) incidentally noted. No lymphadenopathy noted in the  abdomen or pelvis. Reproductive: Prostate gland and seminal vesicles are unremarkable in appearance. Other: Large amount of  pneumoperitoneum. Trace amount of fluid in the left pericolic gutter and in the low left hemipelvis. Musculoskeletal: There are no aggressive appearing lytic or blastic lesions noted in the visualized portions of the skeleton. IMPRESSION: 1. Perforated acute diverticulitis in the mid sigmoid colon. Emergent surgical consultation is strongly recommended. 2. Aortic atherosclerosis. Critical Value/emergent results were called by telephone at the time of interpretation on 07/10/2018 at 1:43 pm to Dr. Aletta Edouard, who verbally acknowledged these results. Electronically Signed   By: Vinnie Langton M.D.   On: 07/10/2018 13:45   Dg Sinus/fist Tube Chk-non Gi  Result Date: 07/28/2018 CLINICAL DATA:  Left lower quadrant diverticular abscess, status post percutaneous drain EXAM: ABSCESS INJECTION CONTRAST:  7 cc Omnipaque 300 FLUOROSCOPY TIME:  Fluoroscopy Time:  1 minutes 6 seconds Radiation Exposure Index (if provided by the fluoroscopic device): Twenty mGy Number of Acquired Spot Images: 6 COMPARISON:  07/28/2018 FINDINGS: Under fluoroscopy, the left lower quadrant abscess drain was injected with contrast. Imaging performed. Abscess cavity is collapsed. There is a small sinus tract adjacent to the collapsed abscess cavity to the left lower quadrant pericolic gutter region. Negative for fistula to the adjacent sigmoid. No peristalsing bowel opacified. IMPRESSION: Small collapsed abscess cavity with residual pericolonic/retroperitoneal sinus tract. Negative for fistula. Electronically Signed   By: Jerilynn Mages.  Sheray Grist M.D.   On: 07/28/2018 13:34   Ct Image Guided Drainage By Percutaneous Catheter  Result Date: 07/15/2018 INDICATION: Enlarging left lower quadrant abdominal abscess EXAM: CT GUIDED DRAINAGE OF LEFT LOWER QUADRANT ABDOMINAL ABSCESS MEDICATIONS: The patient is currently admitted to the hospital and receiving intravenous antibiotics. The antibiotics were administered within an appropriate time frame prior to the  initiation of the procedure. ANESTHESIA/SEDATION: 2.0 mg IV Versed 100 mcg IV Fentanyl Moderate Sedation Time:  16 MINUTES The patient was continuously monitored during the procedure by the interventional radiology nurse under my direct supervision. COMPLICATIONS: None immediate. TECHNIQUE: Informed written consent was obtained from the patient after a thorough discussion of the procedural risks, benefits and alternatives. All questions were addressed. Maximal Sterile Barrier Technique was utilized including caps, mask, sterile gowns, sterile gloves, sterile drape, hand hygiene and skin antiseptic. A timeout was performed prior to the initiation of the procedure. PROCEDURE: Previous imaging reviewed. Patient positioned supine. Noncontrast localization CT performed. The large left lower quadrant abdominal abscess was localized. Overlying skin marked for an anterior oblique approach. Under sterile conditions and local anesthesia, a 18 gauge 10 cm access needle was advanced percutaneously under CT guidance into the fluid collection. Needle position confirmed with CT. Syringe aspiration yielded purulent fluid. Sample sent for culture. Guidewire inserted followed by tract dilatation to insert a 12 [place] drain. Drain catheter position confirmed with CT. Aspiration yielded 100 cc of purulent fluid. Catheter secured with Prolene suture and connected to external suction bulb. Sterile dressing applied. No immediate complication. FINDINGS: Imaging confirms needle placement into the left lower quadrant abscess for drain placement. IMPRESSION: Successful CT-guided left lower quadrant abscess drain insertion Electronically Signed   By: Jerilynn Mages.  Rustyn Conery M.D.   On: 07/15/2018 12:44   Ir Radiologist Eval & Mgmt  Result Date: 07/28/2018 Please refer to notes tab for details about interventional procedure. (Op Note)   Labs:  CBC: Recent Labs    07/14/18 0146 07/15/18 0227 07/16/18 0315 07/17/18 0508  WBC 10.4 10.4 9.3 9.3    HGB 14.4 14.9 14.3 13.8  HCT 41.4 43.4 42.2 40.3  PLT 237 284 341 407*    COAGS: Recent Labs    07/15/18 0227  INR 0.99    BMP: Recent Labs    07/14/18 0819 07/15/18 0227 07/16/18 0315 07/17/18 0508  NA 133* 133* 134* 134*  K 3.1* 3.2* 3.2* 3.2*  CL 101 103 101 104  CO2 24 21* 24 22  GLUCOSE 97 116* 101* 98  BUN 6 <5* <5* <5*  CALCIUM 7.8* 7.8* 7.9* 7.9*  CREATININE 0.97 1.03 1.05 0.88  GFRNONAA >60 >60 >60 >60  GFRAA >60 >60 >60 >60    LIVER FUNCTION TESTS: Recent Labs    07/10/18 1122  BILITOT 0.7  AST 20  ALT 20  ALKPHOS 50  PROT 6.5  ALBUMIN 3.3*     Assessment and Plan:  Resolved left lower quadrant abdominal abscess status post percutaneous drain.  Drain catheter injection demonstrates residual sinus tract communicating with a potential retroperitoneal collapsed cavity but no fistula to adjacent bowel.  Separate left upper quadrant slender loculated fluid collection about the spleen and left paracolic gutter, recommend attention on subsequent follow-up exam in 2 weeks.  If persists, consider CT aspiration  Plan: Switch abscess drain from suction bulb to gravity.  Discontinue flushing.  Repeat outpatient CT in 2 weeks and consider repeat drain injection at that time.  Thank you for this interesting consult.  I greatly enjoyed meeting Douglas Weber and look forward to participating in their care.  A copy of this report was sent to the requesting provider on this date.  Electronically Signed: Greggory Keen 07/28/2018, 4:04 PM   I spent a total of  30 Minutes   in face to face in clinical consultation, greater than 50% of which was counseling/coordinating care for this patient with diverticular abscess status post percutaneous drain

## 2018-08-12 ENCOUNTER — Ambulatory Visit
Admission: RE | Admit: 2018-08-12 | Discharge: 2018-08-12 | Disposition: A | Discharge status: Home / Self Care | Payer: BLUE CROSS/BLUE SHIELD | Source: Ambulatory Visit | Attending: Surgery | Admitting: Surgery

## 2018-08-12 ENCOUNTER — Other Ambulatory Visit: Payer: Self-pay | Admitting: Radiology

## 2018-08-12 ENCOUNTER — Other Ambulatory Visit (HOSPITAL_COMMUNITY): Payer: Self-pay | Admitting: Interventional Radiology

## 2018-08-12 ENCOUNTER — Encounter: Payer: Self-pay | Admitting: Radiology

## 2018-08-12 DIAGNOSIS — R188 Other ascites: Secondary | ICD-10-CM

## 2018-08-12 DIAGNOSIS — K651 Peritoneal abscess: Secondary | ICD-10-CM

## 2018-08-12 HISTORY — PX: IR RADIOLOGIST EVAL & MGMT: IMG5224

## 2018-08-12 MED ORDER — IOPAMIDOL (ISOVUE-300) INJECTION 61%
100.0000 mL | Freq: Once | INTRAVENOUS | Status: AC | PRN
  Administered 2018-08-12: 100 mL via INTRAVENOUS

## 2018-08-12 NOTE — Progress Notes (Signed)
Referring Physician(s): Connor,C  Chief Complaint: The patient is seen in follow up today s/p percutaneous drainage of her left lower quadrant diverticular abscess on 07/15/2018.  History of present illness: Mr. Aline August is a 47 year old white male with history of diverticulitis and associated left lower quadrant abscess who underwent percutaneous drainage on 07/15/2018.  Drain fluid cultures grew a few Candida albicans.  Previous drain injection on 07/28/2018 revealed small collapsed abscess cavity with residual pericolonic/retroperitoneal sinus tract but no fistula.  Patient presents again today in follow-up for repeat CT and drain injection.  Since his last visit the patient has been doing well.  He currently denies fever, headache, chest pain, dyspnea, cough, back pain, nausea, vomiting or bleeding.  He does have a small amount of discomfort at drain site.  Drain output has been minimal.  He is no longer flushing the drain.  He finished course of Augmentin a few weeks ago.   No past medical history on file.  Past Surgical History:  Procedure Laterality Date  . IR RADIOLOGIST EVAL & MGMT  07/28/2018    Allergies: Patient has no known allergies.  Medications: Prior to Admission medications   Medication Sig Start Date End Date Taking? Authorizing Provider  acetaminophen (TYLENOL) 325 MG tablet Take 650 mg by mouth every 6 (six) hours as needed for fever.    [provider]  amoxicillin-clavulanate (AUGMENTIN) 875-125 MG tablet Take 1 tablet by mouth 2 (two) times daily. 07/18/18   Michael Boston, MD     No family history on file.  Social History   Socioeconomic History  . Marital status: Married    Spouse name: Not on file  . Number of children: Not on file  . Years of education: Not on file  . Highest education level: Not on file  Occupational History  . Not on file  Social Needs  . Financial resource strain: Not on file  . Food insecurity:    Worry: Not on file      Inability: Not on file  . Transportation needs:    Medical: Not on file    Non-medical: Not on file  Tobacco Use  . Smoking status: Current Some Day Smoker  . Smokeless tobacco: Never Used  Substance and Sexual Activity  . Alcohol use: Yes    Alcohol/week: 1.0 standard drinks    Types: 1 Glasses of wine per week  . Drug use: Yes    Types: Marijuana    Comment: unsure  . Sexual activity: Not on file  Lifestyle  . Physical activity:    Days per week: Not on file    Minutes per session: Not on file  . Stress: Not on file  Relationships  . Social connections:    Talks on phone: Not on file    Gets together: Not on file    Attends religious service: Not on file    Active member of club or organization: Not on file    Attends meetings of clubs or organizations: Not on file    Relationship status: Not on file  Other Topics Concern  . Not on file  Social History Narrative  . Not on file     Vital Signs: BP (!) 124/94   Pulse 94   Temp 98.6 F (37 C)   SpO2 93%   Physical Exam awake, alert.  Chest clear to auscultation bilaterally.  Heart with regular rate and rhythm.  Abdomen soft, positive bowel sounds, intact left lower quadrant abdominal drain with  small amount of serous appearing fluid in bag.  Insertion site with some mild erythema and mild tenderness.  No lower extremity edema.  Imaging: No results found.  Labs:  CBC: Recent Labs    07/14/18 0146 07/15/18 0227 07/16/18 0315 07/17/18 0508  WBC 10.4 10.4 9.3 9.3  HGB 14.4 14.9 14.3 13.8  HCT 41.4 43.4 42.2 40.3  PLT 237 284 341 407*    COAGS: Recent Labs    07/15/18 0227  INR 0.99    BMP: Recent Labs    07/14/18 0819 07/15/18 0227 07/16/18 0315 07/17/18 0508  NA 133* 133* 134* 134*  K 3.1* 3.2* 3.2* 3.2*  CL 101 103 101 104  CO2 24 21* 24 22  GLUCOSE 97 116* 101* 98  BUN 6 <5* <5* <5*  CALCIUM 7.8* 7.8* 7.9* 7.9*  CREATININE 0.97 1.03 1.05 0.88  GFRNONAA >60 >60 >60 >60  GFRAA >60  >60 >60 >60    LIVER FUNCTION TESTS: Recent Labs    07/10/18 1122  BILITOT 0.7  AST 20  ALT 20  ALKPHOS 50  PROT 6.5  ALBUMIN 3.3*    Assessment: 47 year old white male with history of diverticulitis and associated left lower quadrant abscess who underwent percutaneous drainage on 07/15/2018.  Drain fluid cultures grew a few Candida albicans.  Previous drain injection on 07/28/2018 revealed small collapsed abscess cavity with residual pericolonic/retroperitoneal sinus tract but no fistula.  Follow-up CT today shows no significant collection at drain site but there are slightly enlarging fluid collections in the left lateral abdominal region near spleen which are not large enough for drain placement but amenable to aspiration.  Drain injection today shows no significant fistula to bowel with reflux of contrast out of drain insertion site.  Following discussion with Dr. Laurence Ferrari, left lower quadrant drain was removed in its entirety without immediate complications.  Gauze dressing applied over site.  Plan at this time is for patient to undergo image guided aspiration of the left lateral abdominal fluid collections at [facility] tomorrow morning.  Above findings and preprocedure instructions were reviewed with the patient and wife with their understanding and consent.   Signed: D. Rowe Robert, PA-C 08/12/2018, 1:51 PM   Please refer to Dr. Katrinka Blazing attestation of this note for management and plan.      Patient ID: Ranelle Oyster, male   DOB: Apr 03, 1971, 48 y.o.   MRN: [id number]

## 2018-08-13 ENCOUNTER — Ambulatory Visit (HOSPITAL_COMMUNITY)
Admission: RE | Admit: 2018-08-13 | Discharge: 2018-08-13 | Disposition: A | Discharge status: Home / Self Care | Payer: BLUE CROSS/BLUE SHIELD | Source: Ambulatory Visit | Attending: Interventional Radiology | Admitting: Interventional Radiology

## 2018-08-13 ENCOUNTER — Encounter (HOSPITAL_COMMUNITY): Payer: Self-pay

## 2018-08-13 DIAGNOSIS — R188 Other ascites: Secondary | ICD-10-CM | POA: Diagnosis present

## 2018-08-13 DIAGNOSIS — K5790 Diverticulosis of intestine, part unspecified, without perforation or abscess without bleeding: Secondary | ICD-10-CM | POA: Insufficient documentation

## 2018-08-13 LAB — CBC WITH DIFFERENTIAL/PLATELET
ABS IMMATURE GRANULOCYTES: 0.04 10*3/uL (ref 0.00–0.07)
Basophils Absolute: 0.1 10*3/uL (ref 0.0–0.1)
Basophils Relative: 1 %
Eosinophils Absolute: 0.5 10*3/uL (ref 0.0–0.5)
Eosinophils Relative: 5 %
HEMATOCRIT: 43.8 % (ref 39.0–52.0)
HEMOGLOBIN: 14.6 g/dL (ref 13.0–17.0)
IMMATURE GRANULOCYTES: 0 %
LYMPHS ABS: 2 10*3/uL (ref 0.7–4.0)
LYMPHS PCT: 19 %
MCH: 31.6 pg (ref 26.0–34.0)
MCHC: 33.3 g/dL (ref 30.0–36.0)
MCV: 94.8 fL (ref 80.0–100.0)
MONO ABS: 0.8 10*3/uL (ref 0.1–1.0)
MONOS PCT: 7 %
NEUTROS ABS: 7.2 10*3/uL (ref 1.7–7.7)
NEUTROS PCT: 68 %
Platelets: 418 10*3/uL — ABNORMAL HIGH (ref 150–400)
RBC: 4.62 MIL/uL (ref 4.22–5.81)
RDW: 13 % (ref 11.5–15.5)
WBC: 10.6 10*3/uL — ABNORMAL HIGH (ref 4.0–10.5)
nRBC: 0 % (ref 0.0–0.2)

## 2018-08-13 LAB — BASIC METABOLIC PANEL
Anion gap: 14 (ref 5–15)
CO2: 24 mmol/L (ref 22–32)
Calcium: 8.7 mg/dL — ABNORMAL LOW (ref 8.9–10.3)
Chloride: 102 mmol/L (ref 98–111)
Creatinine, Ser: 0.85 mg/dL (ref 0.61–1.24)
GFR calc Af Amer: >60 mL/min (ref >60)
GFR calc non Af Amer: >60 mL/min (ref >60)
GLUCOSE: 108 mg/dL — AB (ref 70–99)
POTASSIUM: 4 mmol/L (ref 3.5–5.1)
Sodium: 140 mmol/L (ref 135–145)

## 2018-08-13 LAB — PROTIME-INR
INR: 0.96
Prothrombin Time: 12.6 seconds (ref 11.4–15.2)

## 2018-08-13 MED ORDER — FENTANYL CITRATE (PF) 100 MCG/2ML IJ SOLN
INTRAMUSCULAR | Status: AC
  Filled 2018-08-13: qty 4

## 2018-08-13 MED ORDER — MIDAZOLAM HCL 2 MG/2ML IJ SOLN
INTRAMUSCULAR | Status: AC | PRN
  Administered 2018-08-13 (×2): 0.5 mg via INTRAVENOUS
  Administered 2018-08-13: 1 mg via INTRAVENOUS

## 2018-08-13 MED ORDER — MIDAZOLAM HCL 2 MG/2ML IJ SOLN
INTRAMUSCULAR | Status: AC
  Filled 2018-08-13: qty 4

## 2018-08-13 MED ORDER — FENTANYL CITRATE (PF) 100 MCG/2ML IJ SOLN
INTRAMUSCULAR | Status: AC | PRN
  Administered 2018-08-13 (×2): 25 ug via INTRAVENOUS
  Administered 2018-08-13: 50 ug via INTRAVENOUS

## 2018-08-13 MED ORDER — SODIUM CHLORIDE 0.9 % IV SOLN
INTRAVENOUS | Status: DC
  Administered 2018-08-13: 09:00:00 via INTRAVENOUS

## 2018-08-13 MED ORDER — LIDOCAINE HCL 1 % IJ SOLN
INTRAMUSCULAR | Status: AC
  Filled 2018-08-13: qty 20

## 2018-08-13 NOTE — Procedures (Signed)
Pre procedural Dx: Left upper quadrant abscess Post procedural Dx: Same  Technically successful [place] and CT guided placement of a temporary 10 Fr drainage catheter placement into the bilobed collection within the left upper abdominal quadrant yielding a total of 50 cc of purulent fluid.   All aspirated samples sent to the laboratory for analysis.    Given resolution of the collection on imaging as well as proximity of the drain to the left costophrenic angle, the drain was removed at the end of the procedure.   EBL: None  Complications: None immediate  Ronny Bacon, MD Pager #: [phone]

## 2018-08-13 NOTE — H&P (Signed)
Chief Complaint: Patient was seen in consultation today for left upper quadrant fluid collection aspiration at the request of Dr Georgiann Cocker  Supervising Physician: Sandi Mariscal  Patient Status: [facility] - Out-pt  History of Present Illness: Douglas Weber is a 47 y.o. male   Just seen in IR OP Clinic yesterday: Hx diverticular abscess drain placed 07/15/18:  His primary abscess cavity is completely resolved and there is no evidence of fistulous communication.  Therefore, his existing percutaneous drain was removed. However, the loculated fluid collections along the peritoneal surface of the left upper quadrant in the perisplenic region have enlarged compared to prior imaging raising concern that they may harbor bacteria/infection.  He is relatively asymptomatic.  I think it would be best to at least proceed with CT-guided aspiration of these fluid collections.  The fluid can then be sent for culture.  If there is growth, antibiotic coverage could then be implemented.  Now scheduled for left upper quadrant fluid collection aspiration   History reviewed. No pertinent past medical history.  Past Surgical History:  Procedure Laterality Date  . IR RADIOLOGIST EVAL & MGMT  07/28/2018  . IR RADIOLOGIST EVAL & MGMT  08/12/2018    Allergies: Patient has no known allergies.  Medications: Prior to Admission medications   Medication Sig Start Date End Date Taking? Authorizing Provider  acetaminophen (TYLENOL) 325 MG tablet Take 650 mg by mouth every 6 (six) hours as needed for fever.   Yes [provider]  Probiotic Product (PROBIOTIC DAILY PO) Take 1 capsule by mouth daily.   Yes [provider]     History reviewed. No pertinent family history.  Social History   Socioeconomic History  . Marital status: Married    Spouse name: Not on file  . Number of children: Not on file  . Years of education: Not on file  . Highest education level: Not on file  Occupational  History  . Not on file  Social Needs  . Financial resource strain: Not on file  . Food insecurity:    Worry: Not on file    Inability: Not on file  . Transportation needs:    Medical: Not on file    Non-medical: Not on file  Tobacco Use  . Smoking status: Current Some Day Smoker  . Smokeless tobacco: Never Used  Substance and Sexual Activity  . Alcohol use: Yes    Alcohol/week: 1.0 standard drinks    Types: 1 Glasses of wine per week  . Drug use: Yes    Types: Marijuana    Comment: unsure  . Sexual activity: Not on file  Lifestyle  . Physical activity:    Days per week: Not on file    Minutes per session: Not on file  . Stress: Not on file  Relationships  . Social connections:    Talks on phone: Not on file    Gets together: Not on file    Attends religious service: Not on file    Active member of club or organization: Not on file    Attends meetings of clubs or organizations: Not on file    Relationship status: Not on file  Other Topics Concern  . Not on file  Social History Narrative  . Not on file     Review of Systems: A 12 point ROS discussed and pertinent positives are indicated in the HPI above.  All other systems are negative.  Review of Systems  Constitutional: Negative for activity change, fatigue  and fever.  Respiratory: Negative for shortness of breath.   Cardiovascular: Negative for chest pain.  Gastrointestinal: Negative for abdominal pain.  Neurological: Negative for weakness.  Psychiatric/Behavioral: Negative for behavioral problems and confusion.    Vital Signs: BP 139/89   Pulse 87   Temp 98 F (36.7 C) (Skin)   Ht 5\' 8"  (1.727 m)   Wt 160 lb (72.6 kg)   SpO2 97%   BMI 24.33 kg/m   Physical Exam Vitals signs reviewed.  Constitutional:      Appearance: Normal appearance.  Cardiovascular:     Rate and Rhythm: Normal rate and regular rhythm.  Pulmonary:     Effort: Pulmonary effort is normal.     Breath sounds: Normal breath  sounds.  Abdominal:     General: Abdomen is flat.     Palpations: Abdomen is soft.  Musculoskeletal: Normal range of motion.  Skin:    General: Skin is warm and dry.  Neurological:     General: No focal deficit present.     Mental Status: He is alert and oriented to person, place, and time.  Psychiatric:        Mood and Affect: Mood normal.        Behavior: Behavior normal.        Thought Content: Thought content normal.        Judgment: Judgment normal.     Imaging: Dg Chest 2 View  Result Date: 07/14/2018 CLINICAL DATA:  Fever. EXAM: CHEST - 2 VIEW COMPARISON:  None. FINDINGS: The heart size and mediastinal contours are within normal limits. No pneumothorax is noted. Mild left basilar atelectasis or infiltrate is noted with possible small pleural effusion. Minimal right lateral basilar subsegmental atelectasis is noted. The visualized skeletal structures are unremarkable. IMPRESSION: Mild left basilar atelectasis or infiltrate is noted with possible small pleural effusion. Minimal right basilar subsegmental atelectasis is noted laterally. Electronically Signed   By: Marijo Conception, M.D.   On: 07/14/2018 15:22   Ct Abdomen Pelvis W Contrast  Result Date: 08/12/2018 CLINICAL DATA:  47 year old male with complicated diverticulitis and large intra-abdominal abscess treated by percutaneous drain placement on 07/15/2018. Patient has completed his antibiotic therapy (Augmentin) and presents today for follow-up imaging. EXAM: CT ABDOMEN AND PELVIS WITH CONTRAST TECHNIQUE: Multidetector CT imaging of the abdomen and pelvis was performed using the standard protocol following bolus administration of intravenous contrast. CONTRAST:  15mL ISOVUE-300 IOPAMIDOL (ISOVUE-300) INJECTION 61% COMPARISON:  Prior CT scan of the abdomen and pelvis 07/28/2018 FINDINGS: Lower chest: Stable small layering right pleural effusion. Interval resolution of the previously noted layering left pleural effusion. Minimal  right lower lobe atelectasis. Otherwise, the visualized lower lungs are clear. Hepatobiliary: Normal hepatic contour and morphology. No discrete hepatic lesions. Normal appearance of the gallbladder. No intra or extrahepatic biliary ductal dilatation. Pancreas: Unremarkable. No pancreatic ductal dilatation or surrounding inflammatory changes. Spleen: Normal in size without focal abnormality. Adrenals/Urinary Tract: Adrenal glands are unremarkable. No evidence of hydronephrosis or enhancing renal mass. Nonobstructing 6 mm stone in the interpolar left renal collecting system. Bladder is unremarkable. Stomach/Bowel: Sigmoid colonic diverticulosis. Persistent thin linear collection of gas extending from the ruptured diverticulum along the peritoneal surface of the left pelvic sidewall for several cm. No associated fluid or abscess collection. No evidence of bowel obstruction. Normal appendix in the right lower quadrant. Vascular/Lymphatic: Scattered atherosclerotic vascular calcifications along the abdominal aorta. No evidence of aneurysm, dissection or significant stenosis. No suspicious lymphadenopathy. Reproductive: Prostate is unremarkable. Other:  Enlarging peripherally enhancing fluid collections along the peritoneal surface of the left upper quadrant. The largest individual loculation measures 4.1 x 2.7 cm compared to 3.9 x 1.9 cm previously. A more multi locular lenticular collection in the perisplenic region measures approximately 6.8 x 2.3 cm compared to 6.2 by 2.0 cm previously. Well-positioned percutaneous drainage catheter in the left lower quadrant. No residual abscess collection around the drain. No ascites. Musculoskeletal: No acute fracture or aggressive appearing lytic or blastic osseous lesion. IMPRESSION: 1. Interval resolution of perisigmoid abscess with well-positioned residual drainage catheter. 2. Interval enlargement of peripherally enhancing fluid collections along the peritoneal surface of the  left upper quadrant compared to prior imaging from 07/28/2018. Enlargement over time suggests that these collections may be harboring active infection. 3. Small persistent contained perforation of a sigmoid diverticulum existing is a linear extension of gas along the anterior surface of the left pelvic sidewall. No associated abscess. 4. Persistent small right layering pleural effusion. Interval resolution of left-sided pleural effusion. 5. Nonobstructing left-sided nephrolithiasis. 6. Additional ancillary findings as above. PLAN: 1. Existing drainage catheter will be removed. 2. Will schedule patient for CT-guided aspiration of the residual fluid collections in the left upper quadrant. If cultures are positive, will provide antibiotic coverage at that time. Signed, Criselda Peaches, MD, [facility] Radiology Electronically Signed   By: Jacqulynn Cadet M.D.   On: 08/12/2018 13:54   Ct Abdomen Pelvis W Contrast  Result Date: 07/28/2018 CLINICAL DATA:  Diverticular abscess, status post percutaneous drain 07/15/2018 EXAM: CT ABDOMEN AND PELVIS WITH CONTRAST TECHNIQUE: Multidetector CT imaging of the abdomen and pelvis was performed using the standard protocol following bolus administration of intravenous contrast. CONTRAST:  167mL OMNIPAQUE IOHEXOL 300 MG/ML  SOLN COMPARISON:  07/14/2018 FINDINGS: Lower chest: Small pleural effusions noted layering posteriorly with minor dependent basilar atelectasis. Normal heart size. No pericardial effusion. No hiatal hernia. Hepatobiliary: No focal liver abnormality is seen. No gallstones, gallbladder wall thickening, or biliary dilatation. Pancreas: Unremarkable. No pancreatic ductal dilatation or surrounding inflammatory changes. Spleen: Spleen is normal in size. No parenchymal abnormality. Trace anteromedial perisplenic fluid, unchanged image 24. Adrenals/Urinary Tract: Normal adrenal glands. No renal obstruction,  hydronephrosis, or obstructing ureteral calculus. Ureters are symmetric and decompressed bilaterally. No bladder abnormality. Punctate nonobstructing left intrarenal calculi noted measuring 4 mm in the lower pole. Stomach/Bowel: Negative for bowel obstruction, significant dilatation, ileus, free air. Normal appendix. Improving left lower quadrant sigmoid diverticulitis. There is less pericolonic inflammation and edema. Sigmoid wall thickening remains. Stable left lower quadrant collapsed pericolonic air collection. No residual fluid component. Stable drain catheter position. Residual left upper quadrant perisplenic/splenic flexure slender complex loculated enhancing fluid collection, largest loculation measures 1.8 cm, image 39 series 2. This collection is stable to minimally larger than 07/14/2018 and is separate from the left lower quadrant drain catheter site. Vascular/Lymphatic: Aortoiliac atherosclerosis noted. Negative for aneurysm or acute vascular process. No occlusive disease. No adenopathy. No veno-occlusive process. Reproductive: Unremarkable by CT. Other: Intact abdominal wall.  No hernia.  No inguinal abnormality. Musculoskeletal: No acute osseous finding. IMPRESSION: Resolved left lower quadrant large dominant abscess, status post percutaneous drain. Small collapsed pericolonic air collection remains but no residual fluid component in this region. Improvement in the left lower quadrant sigmoid diverticulitis. Separate left upper quadrant slender loculated complex enhancing fluid collection about the spleen and left pericolic gutter, largest component measures 1.8 cm. This collection remains indeterminate recommend attention on subsequent follow-up imaging in 2 weeks  and if continued enlargement, consider aspiration versus drainage. Small pleural effusions and basilar atelectasis Electronically Signed   By: Jerilynn Mages.  Shick M.D.   On: 07/28/2018 13:03   Ct Abdomen Pelvis W Contrast  Result Date:  07/14/2018 CLINICAL DATA:  Left flank pain with nausea and vomiting for 2 days. Sigmoid diverticulitis diagnosed on the prior exam. EXAM: CT ABDOMEN AND PELVIS WITH CONTRAST TECHNIQUE: Multidetector CT imaging of the abdomen and pelvis was performed using the standard protocol following bolus administration of intravenous contrast. CONTRAST:  134mL OMNIPAQUE IOHEXOL 300 MG/ML  SOLN COMPARISON:  07/10/2018. FINDINGS: Lower chest: Small, left greater than right, pleural effusions. Minor dependent lower lobe atelectasis, also greater on the left. Heart normal size. Hepatobiliary: No focal liver abnormality is seen. No gallstones, gallbladder wall thickening, or biliary dilatation. Pancreas: Unremarkable. No pancreatic ductal dilatation or surrounding inflammatory changes. Spleen: Normal in size without focal abnormality. Adrenals/Urinary Tract: No adrenal masses. Kidneys normal size, orientation and position with symmetric enhancement and excretion. Small stone noted in the lower pole of the left kidney. No renal masses. No hydronephrosis. Normal ureters. Normal bladder. Stomach/Bowel: There is thickening of the wall of the sigmoid colon where there are several diverticula. There is surrounding inflammation. A tract extends from the posterior aspect of the affected sigmoid colon to connect to a small collection that contains a small amount of contrast and small bubbles of air. This extends laterally to connect to a large collection in the left lower quadrant and pelvis containing fluid and air. This collection measures 15 x 6 x 12 cm. There are additional small collections, in the anterior pelvis, anterior and slightly superior to the involved sigmoid colon, measuring 2.9 x 1.1 cm, as well as a thin collection, which extends along the lateral aspect the peritoneal cavity, measuring 12 mm in greatest thickness. These collections have developed since the prior CT. Previously seen free air has mostly resolved. No other  areas of colonic inflammation. Small bowel lies adjacent to the pelvic and left lower quadrant collections. Mild fold thickening is evident with the bowel is in close approximation to the collections consistent with reactive edema. Small bowel is normal in caliber its entirety. No other wall fold thickening. Normal appendix visualized. Vascular/Lymphatic: No pathologically enlarged lymph nodes. Mild aortic atherosclerosis. Reproductive: Unremarkable. Other: No hernia. Trace free fluid most evident adjacent to the spleen. Musculoskeletal: No acute or significant osseous findings. IMPRESSION: 1. There has been interval worsening since the prior CT. New abscesses have developed extending from the perforation along the posterior margin of the inflamed sigmoid colon. Largest collection is in the left pelvis/left lower quadrant, measuring 15 x 6 x 12 cm. Inflammatory changes along the sigmoid colon are similar in severity. Most of the free intraperitoneal air has resorbed or organized into the new collections. Electronically Signed   By: Lajean Manes M.D.   On: 07/14/2018 15:08   Dg Sinus/fist Tube Chk-non Gi  Result Date: 08/12/2018 INDICATION: 47 year old male with a history of diverticular abscess with percutaneous drain in place. Previously had a residual retroperitoneal pocket affiliated with drainage catheter. Patient presents today for repeat drain injection. EXAM: Drain injection MEDICATIONS: None ANESTHESIA/SEDATION: None COMPLICATIONS: None immediate. PROCEDURE: Informed written consent was obtained from the patient after a thorough discussion of the procedural risks, benefits and alternatives. All questions were addressed. A timeout was performed prior to the initiation of the procedure. Initial fluoroscopic imaging demonstrates a well-positioned drainage catheter. Contrast injection was performed. The injection tracks back along the  catheter tubing and exits the skin site. No evidence of residual abscess  cavity, retroperitoneal cavity or fistula. IMPRESSION: The abscess cavity is completely collapsed and there is no fistulous communication with the retroperitoneal space or colon. The drainage catheter will be removed. Electronically Signed   By: Jacqulynn Cadet M.D.   On: 08/12/2018 14:08   Dg Sinus/fist Tube Chk-non Gi  Result Date: 07/28/2018 CLINICAL DATA:  Left lower quadrant diverticular abscess, status post percutaneous drain EXAM: ABSCESS INJECTION CONTRAST:  7 cc Omnipaque 300 FLUOROSCOPY TIME:  Fluoroscopy Time:  1 minutes 6 seconds Radiation Exposure Index (if provided by the fluoroscopic device): Twenty mGy Number of Acquired Spot Images: 6 COMPARISON:  07/28/2018 FINDINGS: Under fluoroscopy, the left lower quadrant abscess drain was injected with contrast. Imaging performed. Abscess cavity is collapsed. There is a small sinus tract adjacent to the collapsed abscess cavity to the left lower quadrant pericolic gutter region. Negative for fistula to the adjacent sigmoid. No peristalsing bowel opacified. IMPRESSION: Small collapsed abscess cavity with residual pericolonic/retroperitoneal sinus tract. Negative for fistula. Electronically Signed   By: Jerilynn Mages.  Shick M.D.   On: 07/28/2018 13:34   Ct Image Guided Drainage By Percutaneous Catheter  Result Date: 07/15/2018 INDICATION: Enlarging left lower quadrant abdominal abscess EXAM: CT GUIDED DRAINAGE OF LEFT LOWER QUADRANT ABDOMINAL ABSCESS MEDICATIONS: The patient is currently admitted to the hospital and receiving intravenous antibiotics. The antibiotics were administered within an appropriate time frame prior to the initiation of the procedure. ANESTHESIA/SEDATION: 2.0 mg IV Versed 100 mcg IV Fentanyl Moderate Sedation Time:  16 MINUTES The patient was continuously monitored during the procedure by the interventional radiology nurse under my direct supervision. COMPLICATIONS: None immediate. TECHNIQUE: Informed written consent was obtained from the  patient after a thorough discussion of the procedural risks, benefits and alternatives. All questions were addressed. Maximal Sterile Barrier Technique was utilized including caps, mask, sterile gowns, sterile gloves, sterile drape, hand hygiene and skin antiseptic. A timeout was performed prior to the initiation of the procedure. PROCEDURE: Previous imaging reviewed. Patient positioned supine. Noncontrast localization CT performed. The large left lower quadrant abdominal abscess was localized. Overlying skin marked for an anterior oblique approach. Under sterile conditions and local anesthesia, a 18 gauge 10 cm access needle was advanced percutaneously under CT guidance into the fluid collection. Needle position confirmed with CT. Syringe aspiration yielded purulent fluid. Sample sent for culture. Guidewire inserted followed by tract dilatation to insert a 12 [place] drain. Drain catheter position confirmed with CT. Aspiration yielded 100 cc of purulent fluid. Catheter secured with Prolene suture and connected to external suction bulb. Sterile dressing applied. No immediate complication. FINDINGS: Imaging confirms needle placement into the left lower quadrant abscess for drain placement. IMPRESSION: Successful CT-guided left lower quadrant abscess drain insertion Electronically Signed   By: Jerilynn Mages.  Shick M.D.   On: 07/15/2018 12:44   Ir Radiologist Eval & Mgmt  Result Date: 08/12/2018 Please refer to notes tab for details about interventional procedure. (Op Note)  Ir Radiologist Eval & Mgmt  Result Date: 07/28/2018 Please refer to notes tab for details about interventional procedure. (Op Note)   Labs:  CBC: Recent Labs    07/14/18 0146 07/15/18 0227 07/16/18 0315 07/17/18 0508  WBC 10.4 10.4 9.3 9.3  HGB 14.4 14.9 14.3 13.8  HCT 41.4 43.4 42.2 40.3  PLT 237 284 341 407*    COAGS: Recent Labs    07/15/18 0227  INR 0.99    BMP: Recent Labs  07/14/18 0819 07/15/18 0227  07/16/18 0315 07/17/18 0508  NA 133* 133* 134* 134*  K 3.1* 3.2* 3.2* 3.2*  CL 101 103 101 104  CO2 24 21* 24 22  GLUCOSE 97 116* 101* 98  BUN 6 <5* <5* <5*  CALCIUM 7.8* 7.8* 7.9* 7.9*  CREATININE 0.97 1.03 1.05 0.88  GFRNONAA >60 >60 >60 >60  GFRAA >60 >60 >60 >60    LIVER FUNCTION TESTS: Recent Labs    07/10/18 1122  BILITOT 0.7  AST 20  ALT 20  ALKPHOS 50  PROT 6.5  ALBUMIN 3.3*    TUMOR MARKERS: No results for input(s): AFPTM, CEA, CA199, CHROMGRNA in the last 8760 hours.  Assessment and Plan:  Diverticular disease Recent removal diverticular abscess drain Persistent LUQ fluid collection(s) Now scheduled for aspiration of same Risks and benefits discussed with the patient including, but not limited to bleeding, infection, damage to adjacent structures or low yield requiring additional tests.  All of the patient's questions were answered, patient is agreeable to proceed. Consent signed and in chart.   Thank you for this interesting consult.  I greatly enjoyed meeting Ranelle Oyster and look forward to participating in their care.  A copy of this report was sent to the requesting provider on this date.  Electronically Signed: Lavonia Drafts, PA-C 08/13/2018, 9:48 AM   I spent a total of    25 min in face to face in clinical consultation, greater than 50% of which was counseling/coordinating care for LUQ fluid collections aspiration

## 2018-08-13 NOTE — Progress Notes (Signed)
Patient ID: Douglas Weber, male   DOB: 1970-11-16, 47 y.o.   MRN: [id number]   Augmentin 875-125 mg #20 1 po 2x/daily x 10 days  Rx given to pt per Dr Pascal Lux  (pt has tolerated this medication previously)

## 2018-08-13 NOTE — Discharge Instructions (Addendum)
Needle Biopsy, Care After °Refer to this sheet in the next few weeks. These instructions provide you with information about caring for yourself after your procedure. Your health care provider may also give you more specific instructions. Your treatment has been planned according to current medical practices, but problems sometimes occur. Call your health care provider if you have any problems or questions after your procedure. °What can I expect after the procedure? °After your procedure, it is common to have soreness, bruising, or mild pain at the biopsy site. This should go away in a few days. °Follow these instructions at home: °· Rest as directed by your health care provider. °· Take medicines only as directed by your health care provider. °· There are many different ways to close and cover the biopsy site, including stitches (sutures), skin glue, and adhesive strips. Follow your health care provider's instructions about: °? Biopsy site care. °? Bandage (dressing) changes and removal. °? Biopsy site closure removal. °· Check your biopsy site every day for signs of infection. Watch for: °? Redness, swelling, or pain. °? Fluid, blood, or pus. °Contact a health care provider if: °· You have a fever. °· You have redness, swelling, or pain at the biopsy site that lasts longer than a few days. °· You have fluid, blood, or pus coming from the biopsy site. °· You feel nauseous. °· You vomit. °Get help right away if: °· You have shortness of breath. °· You have trouble breathing. °· You have chest pain. °· You feel dizzy or you faint. °· You have bleeding that does not stop with pressure or a bandage. °· You cough up blood. °· You have pain in your abdomen. °This information is not intended to replace advice given to you by your health care provider. Make sure you discuss any questions you have with your health care provider. °Document Released: 01/02/2015 Document Revised: 01/24/2016 Document Reviewed:  08/14/2014 °Elsevier Interactive Patient Education © 2018 [employer] °Moderate Conscious Sedation, Adult, Care After °These instructions provide you with information about caring for yourself after your procedure. Your health care provider may also give you more specific instructions. Your treatment has been planned according to current medical practices, but problems sometimes occur. Call your health care provider if you have any problems or questions after your procedure. °What can I expect after the procedure? °After your procedure, it is common: °· To feel sleepy for several hours. °· To feel clumsy and have poor balance for several hours. °· To have poor judgment for several hours. °· To vomit if you eat too soon. ° °Follow these instructions at home: °For at least 24 hours after the procedure: ° °· Do not: °? Participate in activities where you could fall or become injured. °? Drive. °? Use heavy machinery. °? Drink alcohol. °? Take sleeping pills or medicines that cause drowsiness. °? Make important decisions or sign legal documents. °? Take care of children on your own. °· Rest. °Eating and drinking °· Follow the diet recommended by your health care provider. °· If you vomit: °? Drink water, juice, or soup when you can drink without vomiting. °? Make sure you have little or no nausea before eating solid foods. °General instructions °· Have a responsible adult stay with you until you are awake and alert. °· Take over-the-counter and prescription medicines only as told by your health care provider. °· If you smoke, do not smoke without supervision. °· Keep all follow-up visits as told by your health care   provider. This is important. °Contact a health care provider if: °· You keep feeling nauseous or you keep vomiting. °· You feel light-headed. °· You develop a rash. °· You have a fever. °Get help right away if: °· You have trouble breathing. °This information is not intended to replace advice given to you  by your health care provider. Make sure you discuss any questions you have with your health care provider. °Document Released: 06/08/2013 Document Revised: 01/21/2016 Document Reviewed: 12/08/2015 °Elsevier Interactive Patient Education © 2018 Elsevier Inc. ° °

## 2018-08-17 LAB — AEROBIC/ANAEROBIC CULTURE W GRAM STAIN (SURGICAL/DEEP WOUND)

## 2018-09-08 ENCOUNTER — Encounter: Payer: Self-pay | Admitting: Gastroenterology

## 2018-09-08 ENCOUNTER — Encounter (INDEPENDENT_AMBULATORY_CARE_PROVIDER_SITE_OTHER): Payer: Self-pay

## 2018-09-08 ENCOUNTER — Ambulatory Visit (INDEPENDENT_AMBULATORY_CARE_PROVIDER_SITE_OTHER): Payer: BLUE CROSS/BLUE SHIELD | Admitting: Gastroenterology

## 2018-09-08 VITALS — BP 112/74 | HR 66 | Ht 68.0 in | Wt 160.0 lb

## 2018-09-08 DIAGNOSIS — K5792 Diverticulitis of intestine, part unspecified, without perforation or abscess without bleeding: Secondary | ICD-10-CM | POA: Diagnosis not present

## 2018-09-08 DIAGNOSIS — R1032 Left lower quadrant pain: Secondary | ICD-10-CM

## 2018-09-08 MED ORDER — PEG-KCL-NACL-NASULF-NA ASC-C 140 G PO SOLR: 140.0000 g | ORAL | 0 refills | Status: DC

## 2018-09-08 NOTE — Patient Instructions (Signed)
If you are age 47 or older, your body mass index should be between 23-30. Your Body mass index is 24.33 kg/m. If this is out of the aforementioned range listed, please consider follow up with your Primary Care Provider.  If you are age 58 or younger, your body mass index should be between 19-25. Your Body mass index is 24.33 kg/m. If this is out of the aformentioned range listed, please consider follow up with your Primary Care Provider.   You have been scheduled for a colonoscopy. Please follow written instructions given to you at your visit today.  Please pick up your prep supplies at the pharmacy within the next 1-3 days. If you use inhalers (even only as needed), please bring them with you on the day of your procedure. Your physician has requested that you go to www.startemmi.com and enter the access code given to you at your visit today. This web site gives a general overview about your procedure. However, you should still follow specific instructions given to you by our office regarding your preparation for the procedure.  It was a pleasure to see you today!  Dr. Loletha Carrow

## 2018-09-08 NOTE — Progress Notes (Signed)
[facility] Consult Note:  History: Douglas Weber 09/08/2018  Referring physician: Romana Juniper, MD [facility] surgery)  Reason for consult/chief complaint: Abdominal Pain (LLQ) and Back Pain (Left lower )   Subjective  HPI:  This is a very pleasant 48 year old man referred by surgery for recent episode of diverticulitis.  He was admitted to the hospital in mid November with severe sigmoid diverticulitis with perforation and abscess.  It did not improve with just IV antibiotics, so a peripheral drain was placed.  He went home with that for a few weeks, then repeat imaging a few weeks ago showed persistent infection and fluid collection.  He had another interventional procedure to apparently drain this action, and external drain was then removed.  I do not have notes from the surgical practice, but Douglas Weber says he received further antibiotics after that.  He has recovered and his abdominal pain is resolved.  He lost a considerable amount of weight during this illness, but says his appetite is slowly recovering.  He denies any previous episodes of diverticulitis, change in bowel habits or rectal bleeding.   ROS:  Review of Systems  Constitutional: Negative for appetite change and unexpected weight change.  HENT: Negative for mouth sores and voice change.   Eyes: Negative for pain and redness.  Respiratory: Negative for cough and shortness of breath.   Cardiovascular: Negative for chest pain and palpitations.  Genitourinary: Negative for dysuria and hematuria.  Musculoskeletal: Negative for arthralgias and myalgias.  Skin: Negative for pallor and rash.  Neurological: Negative for weakness and headaches.  Hematological: Negative for adenopathy.     Past Medical History: History reviewed. No pertinent past medical history. No chronic medical problems  Past Surgical History: Past Surgical History:  Procedure Laterality Date  . IR RADIOLOGIST EVAL & MGMT   07/28/2018  . IR RADIOLOGIST EVAL & MGMT  08/12/2018     Family History: History reviewed. No pertinent family history. No family history of colorectal cancer  Social History: Social History   Socioeconomic History  . Marital status: Married    Spouse name: Not on file  . Number of children: 0  . Years of education: Not on file  . Highest education level: Not on file  Occupational History  . Not on file  Social Needs  . Financial resource strain: Not on file  . Food insecurity:    Worry: Not on file    Inability: Not on file  . Transportation needs:    Medical: Not on file    Non-medical: Not on file  Tobacco Use  . Smoking status: Current Some Day Smoker  . Smokeless tobacco: Never Used  Substance and Sexual Activity  . Alcohol use: Yes    Alcohol/week: 1.0 standard drinks    Types: 1 Glasses of wine per week  . Drug use: Yes    Types: Marijuana    Comment: unsure  . Sexual activity: Yes    Partners: Female  Lifestyle  . Physical activity:    Days per week: Not on file    Minutes per session: Not on file  . Stress: Not on file  Relationships  . Social connections:    Talks on phone: Not on file    Gets together: Not on file    Attends religious service: Not on file    Active member of club or organization: Not on file    Attends meetings of clubs or organizations: Not on file    Relationship  status: Not on file  Other Topics Concern  . Not on file  Social History Narrative  . Not on file   News director at local [employer]  Allergies: No Known Allergies  Outpatient Meds: Current Outpatient Medications  Medication Sig Dispense Refill  . acetaminophen (TYLENOL) 325 MG tablet Take 650 mg by mouth every 6 (six) hours as needed for fever.    . Probiotic Product (PROBIOTIC DAILY PO) Take 1 capsule by mouth daily.    Marland Kitchen PEG-KCl-NaCl-NaSulf-Na Asc-C (PLENVU) 140 g SOLR Take 140 g by mouth as directed. 1 each 0   No current facility-administered  medications for this visit.       ___________________________________________________________________ Objective   Exam:  BP 112/74   Pulse 66   Ht 5\' 8"  (1.727 m)   Wt 160 lb (72.6 kg)   BMI 24.33 kg/m    General: this is a(n) thin and otherwise well-appearing man  Eyes: sclera anicteric, no redness  ENT: oral mucosa moist without lesions, no cervical or supraclavicular lymphadenopathy  CV: RRR without murmur, S1/S2, no JVD, no peripheral edema  Resp: clear to auscultation bilaterally, normal RR and effort noted  GI: soft, no tenderness, with active bowel sounds. No guarding or palpable organomegaly noted.  Drain site left lower quadrant has closed well  Skin; warm and dry, no rash or jaundice noted  Neuro: awake, alert and oriented x 3. Normal gross motor function and fluent speech  Labs:  CBC Latest Ref Rng & Units 08/13/2018 07/17/2018 07/16/2018  WBC 4.0 - 10.5 K/uL 10.6(H) 9.3 9.3  Hemoglobin 13.0 - 17.0 g/dL 14.6 13.8 14.3  Hematocrit 39.0 - 52.0 % 43.8 40.3 42.2  Platelets 150 - 400 K/uL 418(H) 407(H) 341     Radiologic Studies:  Multiple CTAP reports from Nov and Dec 2019 reviewed.  Assessment: Encounter Diagnoses  Name Primary?  . Acute diverticulitis Yes  . LLQ abdominal pain     A protracted episode of complicated diverticulitis requiring hospitalization, IV antibiotics and long-term drain placement.  He has now recovered from that, and says the plan is for elective sigmoid resection.  Surgery has requested a colonoscopy to evaluate for any other pathology prior to planning resection.  This is reasonable, and I advised the patient to do so.  He is agreeable after thorough discussion of procedure and risks.  Plan:  Colonoscopy.  The benefits and risks of the planned procedure were described in detail with the patient or (when appropriate) their health care proxy.  Risks were outlined as including, but not limited to, bleeding, infection,  perforation, adverse medication reaction leading to cardiac or pulmonary decompensation, or pancreatitis (if ERCP).  The limitation of incomplete mucosal visualization was also discussed.  No guarantees or warranties were given.   Thank you for the courtesy of this consult.  Please call me with any questions or concerns.  Nelida Meuse III  CC: Referring provider noted above

## 2018-09-11 ENCOUNTER — Other Ambulatory Visit: Payer: Self-pay

## 2018-09-11 ENCOUNTER — Emergency Department (HOSPITAL_COMMUNITY)
Admission: EM | Admit: 2018-09-11 | Discharge: 2018-09-11 | Disposition: A | Discharge status: Home / Self Care | Payer: BLUE CROSS/BLUE SHIELD | Attending: Emergency Medicine | Admitting: Emergency Medicine

## 2018-09-11 ENCOUNTER — Emergency Department (HOSPITAL_COMMUNITY): Payer: BLUE CROSS/BLUE SHIELD

## 2018-09-11 DIAGNOSIS — R109 Unspecified abdominal pain: Secondary | ICD-10-CM | POA: Diagnosis present

## 2018-09-11 DIAGNOSIS — F17200 Nicotine dependence, unspecified, uncomplicated: Secondary | ICD-10-CM | POA: Insufficient documentation

## 2018-09-11 DIAGNOSIS — N200 Calculus of kidney: Secondary | ICD-10-CM | POA: Diagnosis not present

## 2018-09-11 DIAGNOSIS — F1210 Cannabis abuse, uncomplicated: Secondary | ICD-10-CM | POA: Insufficient documentation

## 2018-09-11 DIAGNOSIS — K651 Peritoneal abscess: Secondary | ICD-10-CM | POA: Insufficient documentation

## 2018-09-11 DIAGNOSIS — Z79899 Other long term (current) drug therapy: Secondary | ICD-10-CM | POA: Insufficient documentation

## 2018-09-11 LAB — CBC WITH DIFFERENTIAL/PLATELET
Abs Immature Granulocytes: 0.07 10*3/uL (ref 0.00–0.07)
Basophils Absolute: 0 10*3/uL (ref 0.0–0.1)
Basophils Relative: 0 %
EOS PCT: 0 %
Eosinophils Absolute: 0 10*3/uL (ref 0.0–0.5)
HCT: 41.2 % (ref 39.0–52.0)
Hemoglobin: 13.3 g/dL (ref 13.0–17.0)
Immature Granulocytes: 1 %
Lymphocytes Relative: 7 %
Lymphs Abs: 1.1 10*3/uL (ref 0.7–4.0)
MCH: 29.8 pg (ref 26.0–34.0)
MCHC: 32.3 g/dL (ref 30.0–36.0)
MCV: 92.2 fL (ref 80.0–100.0)
Monocytes Absolute: 1 10*3/uL (ref 0.1–1.0)
Monocytes Relative: 7 %
Neutro Abs: 12.5 10*3/uL — ABNORMAL HIGH (ref 1.7–7.7)
Neutrophils Relative %: 85 %
Platelets: 437 10*3/uL — ABNORMAL HIGH (ref 150–400)
RBC: 4.47 MIL/uL (ref 4.22–5.81)
RDW: 13.8 % (ref 11.5–15.5)
WBC: 14.7 10*3/uL — ABNORMAL HIGH (ref 4.0–10.5)
nRBC: 0 % (ref 0.0–0.2)

## 2018-09-11 LAB — BASIC METABOLIC PANEL
Anion gap: 11 (ref 5–15)
BUN: 5 mg/dL — AB (ref 6–20)
CO2: 25 mmol/L (ref 22–32)
Calcium: 8.7 mg/dL — ABNORMAL LOW (ref 8.9–10.3)
Chloride: 99 mmol/L (ref 98–111)
Creatinine, Ser: 0.99 mg/dL (ref 0.61–1.24)
GFR calc Af Amer: >60 mL/min (ref >60)
GFR calc non Af Amer: >60 mL/min (ref >60)
GLUCOSE: 107 mg/dL — AB (ref 70–99)
Potassium: 3.6 mmol/L (ref 3.5–5.1)
Sodium: 135 mmol/L (ref 135–145)

## 2018-09-11 LAB — URINALYSIS, ROUTINE W REFLEX MICROSCOPIC
Bilirubin Urine: NEGATIVE
Glucose, UA: NEGATIVE mg/dL
Ketones, ur: 5 mg/dL — AB
Leukocytes, UA: NEGATIVE
Nitrite: NEGATIVE
Protein, ur: NEGATIVE mg/dL
RBC / HPF: >50 RBC/hpf — ABNORMAL HIGH (ref 0–5)
Specific Gravity, Urine: 1.012 (ref 1.005–1.030)
pH: 5 (ref 5.0–8.0)

## 2018-09-11 MED ORDER — FENTANYL CITRATE (PF) 100 MCG/2ML IJ SOLN
50.0000 ug | Freq: Once | INTRAMUSCULAR | Status: AC
  Administered 2018-09-11: 50 ug via INTRAVENOUS
  Filled 2018-09-11: qty 2

## 2018-09-11 MED ORDER — TAMSULOSIN HCL 0.4 MG PO CAPS: 0.4000 mg | ORAL_CAPSULE | Freq: Every day | ORAL | 0 refills | Status: DC

## 2018-09-11 MED ORDER — SODIUM CHLORIDE 0.9 % IV BOLUS
500.0000 mL | Freq: Once | INTRAVENOUS | Status: AC
  Administered 2018-09-11: 500 mL via INTRAVENOUS

## 2018-09-11 MED ORDER — OXYCODONE-ACETAMINOPHEN 5-325 MG PO TABS: 1.0000 | ORAL_TABLET | ORAL | 0 refills | Status: DC | PRN

## 2018-09-11 MED ORDER — KETOROLAC TROMETHAMINE 30 MG/ML IJ SOLN
30.0000 mg | Freq: Once | INTRAMUSCULAR | Status: AC
  Administered 2018-09-11: 30 mg via INTRAVENOUS
  Filled 2018-09-11: qty 1

## 2018-09-11 MED ORDER — ONDANSETRON HCL 4 MG PO TABS: 4.0000 mg | ORAL_TABLET | Freq: Four times a day (QID) | ORAL | 0 refills | Status: DC

## 2018-09-11 MED ORDER — ONDANSETRON HCL 4 MG/2ML IJ SOLN
4.0000 mg | Freq: Once | INTRAMUSCULAR | Status: AC
  Administered 2018-09-11: 4 mg via INTRAVENOUS
  Filled 2018-09-11: qty 2

## 2018-09-11 MED ORDER — KETOROLAC TROMETHAMINE 10 MG PO TABS: 10.0000 mg | ORAL_TABLET | Freq: Four times a day (QID) | ORAL | 0 refills | Status: DC | PRN

## 2018-09-11 NOTE — ED Triage Notes (Signed)
Pt reports intermittent lower left back/flank pain that began Tuesday. Denies injury, denies urinary symptoms.

## 2018-09-11 NOTE — Discharge Instructions (Addendum)
You have a 6 mm kidney stone in your left ureter.  Additionally, you have a fluid collection around your spleen in the left upper abdomen which is likely a recurrence of the abscess.  Discussed your care with the general surgeon on-call and the urologist.  Call the general surgeon's office early Monday morning for a visit on Monday to discuss further your options.  Return here for fever, chills, worsening pain, any concerns.  Prescriptions for 2 types of pain medicine, nausea medicine, Flomax.

## 2018-09-11 NOTE — ED Notes (Signed)
Patient transported to CT 

## 2018-09-11 NOTE — ED Provider Notes (Addendum)
[place] EMERGENCY DEPARTMENT Provider Note   CSN: [id number] Arrival date & time: 09/11/18  1500     History   Chief Complaint Chief Complaint  Patient presents with  . Back Pain    HPI Douglas Weber is a 48 y.o. male.  Level 5 caveat for acuity of condition.  Patient reports remittent left flank pain since Tuesday.  He went to urgent care center today and blood was noted in his urine.  He was sent to the ED for further evaluation of same.  Status post admission to hospital on 07/10/2018 for diverticulitis.  Status post placement of drain via IR on 07/15/2018 for abscess formation in left upper quadrant.  Patient has been doing well post discharge.  No fever, sweats, chills, dysuria.  Severity of pain is moderate to severe.  Patient has recently been evaluated by Dr. Windle Guard of general surgery.  They are in a discussion whether he will need a bowel resection.     No past medical history on file.  Patient Active Problem List   Diagnosis Date Noted  . Hypokalemia 07/17/2018  . Intra-abdominal abscess s/p perc drainage 07/15/2018 07/17/2018  . Diverticulitis of colon with perforation and abscess 07/10/2018    Past Surgical History:  Procedure Laterality Date  . IR RADIOLOGIST EVAL & MGMT  07/28/2018  . IR RADIOLOGIST EVAL & MGMT  08/12/2018        Home Medications    Prior to Admission medications   Medication Sig Start Date End Date Taking? Authorizing Provider  acetaminophen (TYLENOL) 500 MG tablet Take 1,000 mg by mouth every 6 (six) hours as needed for fever or headache (pain).    Yes [provider]  Probiotic Product (PROBIOTIC DAILY PO) Take 1 capsule by mouth daily.   Yes [provider]  PEG-KCl-NaCl-NaSulf-Na Asc-C (PLENVU) 140 g SOLR Take 140 g by mouth as directed. 09/08/18   Doran Stabler, MD    Family History No family history on file.  Social History Social History   Tobacco Use  . Smoking status: Current  Some Day Smoker  . Smokeless tobacco: Never Used  Substance Use Topics  . Alcohol use: Yes    Alcohol/week: 1.0 standard drinks    Types: 1 Glasses of wine per week  . Drug use: Yes    Types: Marijuana    Comment: unsure     Allergies   Patient has no known allergies.   Review of Systems Review of Systems  Unable to perform ROS: Acuity of condition     Physical Exam Updated Vital Signs BP 134/87   Pulse (!) 101   Temp 98.4 F (36.9 C) (Oral)   Resp 16   Ht 5\' 8"  (1.727 m)   Wt 72.6 kg   SpO2 98%   BMI 24.33 kg/m   Physical Exam Vitals signs and nursing note reviewed.  Constitutional:      Appearance: He is well-developed.  HENT:     Head: Normocephalic and atraumatic.  Eyes:     Conjunctiva/sclera: Conjunctivae normal.  Neck:     Musculoskeletal: Neck supple.  Cardiovascular:     Rate and Rhythm: Normal rate and regular rhythm.  Pulmonary:     Effort: Pulmonary effort is normal.     Breath sounds: Normal breath sounds.  Abdominal:     General: Bowel sounds are normal.     Palpations: Abdomen is soft.  Genitourinary:    Comments: Tender left flank Musculoskeletal: Normal  range of motion.  Skin:    General: Skin is warm and dry.  Neurological:     Mental Status: He is alert and oriented to person, place, and time.  Psychiatric:        Behavior: Behavior normal.      ED Treatments / Results  Labs (all labs ordered are listed, but only abnormal results are displayed) Labs Reviewed  CBC WITH DIFFERENTIAL/PLATELET - Abnormal; Notable for the following components:      Result Value   WBC 14.7 (*)    Platelets 437 (*)    Neutro Abs 12.5 (*)    All other components within normal limits  BASIC METABOLIC PANEL - Abnormal; Notable for the following components:   Glucose, Bld 107 (*)    BUN 5 (*)    Calcium 8.7 (*)    All other components within normal limits  URINALYSIS, ROUTINE W REFLEX MICROSCOPIC - Abnormal; Notable for the following components:    Hgb urine dipstick LARGE (*)    Ketones, ur 5 (*)    RBC / HPF >50 (*)    Bacteria, UA RARE (*)    All other components within normal limits    EKG None  Radiology Ct Renal Stone Study  Result Date: 09/11/2018 CLINICAL DATA:  Acute onset LEFT flank pain and hematuria. Recent drainage of a LEFT UPPER QUADRANT abdominal abscess 1 month ago. Personal history of perforated sigmoid colon diverticulitis in November, 2019. EXAM: CT ABDOMEN AND PELVIS WITHOUT CONTRAST TECHNIQUE: Multidetector CT imaging of the abdomen and pelvis was performed following the standard protocol without IV contrast. COMPARISON:  08/13/2018, 08/12/2018 and earlier. FINDINGS: Lower chest: Small LEFT pleural effusion and associated passive atelectasis involving the LEFT LOWER LOBE. Minimal lingular scarring. Visualized lung bases otherwise clear. Normal heart size. Hepatobiliary: Normal unenhanced appearance of the liver. Gallbladder normal in appearance without calcified gallstones. No biliary ductal dilation. Pancreas: Normal unenhanced appearance. Spleen: Normal unenhanced appearance. Adrenals/Urinary Tract: Mild BILATERAL adrenal enlargement without nodularity. Approximate 5-6 mm calculus involving the distal LEFT ureter accounting for mild LEFT hydroureteronephrosis, mild LEFT renal edema and mild LEFT perinephric edema. No urinary tract calculi elsewhere on either side. Within the limits of the unenhanced technique, no focal parenchymal abnormality involving either kidney. Normal appearing urinary bladder. Stomach/Bowel: Stomach normal in appearance for the degree of distention. Normal-appearing small bowel. Extensive sigmoid colon diverticulosis without evidence of acute diverticulitis. Scarring adjacent to the distal sigmoid colon at the site of the prior abscess. Remainder of the colon normal in appearance with expected stool burden. Normal gas-filled appendix in the RIGHT UPPER pelvis. 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 No pathologic lymphadenopathy. Reproductive: Normal sized prostate gland containing calcifications. Normal seminal vesicles. Other: Interval removal of the drainage catheter from the abscess in the [place] adjacent to the spleen with residual or recurrent abscess measuring maximally approximately 14 x 3 cm. Musculoskeletal: Spondylosis involving the visualized LOWER thoracic spine. Regional skeleton otherwise unremarkable. IMPRESSION: 1. Obstructing 5-6 mm calculus in the distal LEFT ureter. 2. Interval removal of the drainage catheter from the abscess in the LEFT UPPER QUADRANT adjacent to the spleen with residual or recurrent abscess measuring maximally 14 x 3 cm. 3. Extensive sigmoid colon diverticulosis without evidence of acute diverticulitis. 4. Small LEFT pleural effusion and associated passive atelectasis involving the LEFT LOWER LOBE. 5.  Aortic Atherosclerosis, advanced for patient age.  (ICD10-170.0) Electronically Signed   By: Evangeline Dakin M.D.   On:  09/11/2018 17:12    Procedures Procedures (including critical care time)  Medications Ordered in ED Medications  sodium chloride 0.9 % bolus 500 mL (0 mLs Intravenous Stopped 09/11/18 1722)  ondansetron (ZOFRAN) injection 4 mg (4 mg Intravenous Given 09/11/18 1621)  ketorolac (TORADOL) 30 MG/ML injection 30 mg (30 mg Intravenous Given 09/11/18 1621)  fentaNYL (SUBLIMAZE) injection 50 mcg (50 mcg Intravenous Given 09/11/18 1621)     Initial Impression / Assessment and Plan / ED Course  I have reviewed the triage vital signs and the nursing notes.  Pertinent labs & imaging results that were available during my care of the patient were reviewed by me and considered in my medical decision making (see chart for details).     Patient presents with left flank pain.  White count 14.7.  Urinalysis reveals hemoglobin.  CT abdomen pelvis show a 6 mm calculus in the left  distal ureter.  Additionally, there is a 14 x 3 cm collection in the left upper quadrant consistent with abscess.  Will discuss with general surgery and urology.   CRITICAL CARE Performed by: Nat Christen Total critical care time: 30 minutes Critical care time was exclusive of separately billable procedures and treating other patients. Critical care was necessary to treat or prevent imminent or life-threatening deterioration. Critical care was time spent personally by me on the following activities: development of treatment plan with patient and/or surrogate as well as nursing, discussions with consultants, evaluation of patient's response to treatment, examination of patient, obtaining history from patient or surrogate, ordering and performing treatments and interventions, ordering and review of laboratory studies, ordering and review of radiographic studies, pulse oximetry and re-evaluation of patient's condition.  Final Clinical Impressions(s) / ED Diagnoses   Final diagnoses:  Kidney stone on left side  Abscess of left upper quadrant of abdomen [facility])    ED Discharge Orders    None       Nat Christen, MD 09/11/18 [place]    Nat Christen, MD 09/11/18 1820

## 2018-09-13 ENCOUNTER — Telehealth: Payer: Self-pay | Admitting: Gastroenterology

## 2018-09-13 NOTE — Telephone Encounter (Signed)
Patient cx appt for this Wednesday 1/15 because has other health issues that came up (kidney stones) will cb to resch.

## 2018-09-15 ENCOUNTER — Encounter: Payer: BLUE CROSS/BLUE SHIELD | Admitting: Gastroenterology

## 2018-09-17 ENCOUNTER — Other Ambulatory Visit (HOSPITAL_COMMUNITY): Payer: Self-pay | Admitting: Surgery

## 2018-09-17 ENCOUNTER — Other Ambulatory Visit: Payer: Self-pay | Admitting: Surgery

## 2018-09-17 DIAGNOSIS — K5732 Diverticulitis of large intestine without perforation or abscess without bleeding: Secondary | ICD-10-CM

## 2018-09-29 ENCOUNTER — Other Ambulatory Visit: Payer: Self-pay | Admitting: Radiology

## 2018-09-30 ENCOUNTER — Ambulatory Visit (HOSPITAL_COMMUNITY)
Admission: RE | Admit: 2018-09-30 | Discharge: 2018-09-30 | Disposition: A | Discharge status: Home / Self Care | Payer: BLUE CROSS/BLUE SHIELD | Source: Ambulatory Visit | Attending: Surgery | Admitting: Surgery

## 2018-09-30 ENCOUNTER — Other Ambulatory Visit: Payer: Self-pay

## 2018-09-30 ENCOUNTER — Other Ambulatory Visit (HOSPITAL_COMMUNITY): Payer: Self-pay | Admitting: Surgery

## 2018-09-30 DIAGNOSIS — K5792 Diverticulitis of intestine, part unspecified, without perforation or abscess without bleeding: Secondary | ICD-10-CM | POA: Insufficient documentation

## 2018-09-30 DIAGNOSIS — K5732 Diverticulitis of large intestine without perforation or abscess without bleeding: Secondary | ICD-10-CM

## 2018-09-30 LAB — COMPREHENSIVE METABOLIC PANEL
ALT: 9 U/L (ref 0–44)
AST: 17 U/L (ref 15–41)
Albumin: 2.9 g/dL — ABNORMAL LOW (ref 3.5–5.0)
Alkaline Phosphatase: 57 U/L (ref 38–126)
Anion gap: 9 (ref 5–15)
BUN: <5 mg/dL — ABNORMAL LOW (ref 6–20)
CO2: 24 mmol/L (ref 22–32)
Calcium: 8.6 mg/dL — ABNORMAL LOW (ref 8.9–10.3)
Chloride: 103 mmol/L (ref 98–111)
Creatinine, Ser: 0.76 mg/dL (ref 0.61–1.24)
GFR calc Af Amer: >60 mL/min (ref >60)
GFR calc non Af Amer: >60 mL/min (ref >60)
Glucose, Bld: 101 mg/dL — ABNORMAL HIGH (ref 70–99)
POTASSIUM: 4.3 mmol/L (ref 3.5–5.1)
Sodium: 136 mmol/L (ref 135–145)
Total Bilirubin: 0.8 mg/dL (ref 0.3–1.2)
Total Protein: 6.7 g/dL (ref 6.5–8.1)

## 2018-09-30 LAB — CBC WITH DIFFERENTIAL/PLATELET
Abs Immature Granulocytes: 0.04 10*3/uL (ref 0.00–0.07)
Basophils Absolute: 0 10*3/uL (ref 0.0–0.1)
Basophils Relative: 0 %
EOS PCT: 1 %
Eosinophils Absolute: 0.1 10*3/uL (ref 0.0–0.5)
HCT: 42.9 % (ref 39.0–52.0)
Hemoglobin: 13.6 g/dL (ref 13.0–17.0)
Immature Granulocytes: 0 %
LYMPHS ABS: 1.6 10*3/uL (ref 0.7–4.0)
Lymphocytes Relative: 15 %
MCH: 28.5 pg (ref 26.0–34.0)
MCHC: 31.7 g/dL (ref 30.0–36.0)
MCV: 89.9 fL (ref 80.0–100.0)
Monocytes Absolute: 0.8 10*3/uL (ref 0.1–1.0)
Monocytes Relative: 7 %
Neutro Abs: 8.2 10*3/uL — ABNORMAL HIGH (ref 1.7–7.7)
Neutrophils Relative %: 77 %
Platelets: 405 10*3/uL — ABNORMAL HIGH (ref 150–400)
RBC: 4.77 MIL/uL (ref 4.22–5.81)
RDW: 15.2 % (ref 11.5–15.5)
WBC: 10.7 10*3/uL — ABNORMAL HIGH (ref 4.0–10.5)
nRBC: 0 % (ref 0.0–0.2)

## 2018-09-30 LAB — PROTIME-INR
INR: 0.98
Prothrombin Time: 12.9 seconds (ref 11.4–15.2)

## 2018-09-30 MED ORDER — MIDAZOLAM HCL 2 MG/2ML IJ SOLN
INTRAMUSCULAR | Status: AC | PRN
  Administered 2018-09-30: 0.5 mg via INTRAVENOUS
  Administered 2018-09-30: 1 mg via INTRAVENOUS

## 2018-09-30 MED ORDER — SODIUM CHLORIDE 0.9 % IV SOLN: INTRAVENOUS | Status: DC

## 2018-09-30 MED ORDER — FENTANYL CITRATE (PF) 100 MCG/2ML IJ SOLN
INTRAMUSCULAR | Status: AC | PRN
  Administered 2018-09-30: 25 ug via INTRAVENOUS
  Administered 2018-09-30: 50 ug via INTRAVENOUS

## 2018-09-30 MED ORDER — LIDOCAINE HCL 1 % IJ SOLN
INTRAMUSCULAR | Status: AC
  Filled 2018-09-30: qty 20

## 2018-09-30 MED ORDER — FENTANYL CITRATE (PF) 100 MCG/2ML IJ SOLN
INTRAMUSCULAR | Status: AC
  Filled 2018-09-30: qty 4

## 2018-09-30 MED ORDER — MIDAZOLAM HCL 2 MG/2ML IJ SOLN
INTRAMUSCULAR | Status: AC
  Filled 2018-09-30: qty 4

## 2018-09-30 NOTE — Discharge Instructions (Addendum)
Moderate Conscious Sedation, Adult, Care After °These instructions provide you with information about caring for yourself after your procedure. Your health care provider may also give you more specific instructions. Your treatment has been planned according to current medical practices, but problems sometimes occur. Call your health care provider if you have any problems or questions after your procedure. °What can I expect after the procedure? °After your procedure, it is common: °· To feel sleepy for several hours. °· To feel clumsy and have poor balance for several hours. °· To have poor judgment for several hours. °· To vomit if you eat too soon. °Follow these instructions at home: °For at least 24 hours after the procedure: ° °· Do not: °? Participate in activities where you could fall or become injured. °? Drive. °? Use heavy machinery. °? Drink alcohol. °? Take sleeping pills or medicines that cause drowsiness. °? Make important decisions or sign legal documents. °? Take care of children on your own. °· Rest. °Eating and drinking °· Follow the diet recommended by your health care provider. °· If you vomit: °? Drink water, juice, or soup when you can drink without vomiting. °? Make sure you have little or no nausea before eating solid foods. °General instructions °· Have a responsible adult stay with you until you are awake and alert. °· Take over-the-counter and prescription medicines only as told by your health care provider. °· If you smoke, do not smoke without supervision. °· Keep all follow-up visits as told by your health care provider. This is important. °Contact a health care provider if: °· You keep feeling nauseous or you keep vomiting. °· You feel light-headed. °· You develop a rash. °· You have a fever. °Get help right away if: °· You have trouble breathing. °This information is not intended to replace advice given to you by your health care provider. Make sure you discuss any questions you have  with your health care provider. °Document Released: 06/08/2013 Document Revised: 01/21/2016 Document Reviewed: 12/08/2015 °Elsevier Interactive Patient Education © 2019 [employer]. ° °

## 2018-09-30 NOTE — H&P (Signed)
Referring Physician(s): Connor,Chelsea A  Supervising Physician: Corrie Mckusick  Patient Status:  [facility] OP  Chief Complaint:  Left abdominal fluid collection  Subjective: Pt familiar to IR service from prior LLQ diverticular abscess drain placement on 07/15/18 followed by removal on 08/12/18 and image guided aspiration of a LUQ fluid collection/abscess on 08/13/18; cx grew bacteroides; pt has finished course of augmentin. Recent f/u imaging for left renal stone evaluation also revealed residual fluid/abscess in LUQ region. He presents again today for image guided aspiration/possible drainage of this fluid collection. He denies fever,HA,CP, dyspnea, cough, worsening abd /back pain,N/V or bleeding. He has noticed a small tender cystic structure on scrotum.   No past medical history on file. Past Surgical History:  Procedure Laterality Date  . IR RADIOLOGIST EVAL & MGMT  07/28/2018  . IR RADIOLOGIST EVAL & MGMT  08/12/2018        Allergies: Patient has no known allergies.  Medications: Prior to Admission medications   Medication Sig Start Date End Date Taking? Authorizing Provider  acetaminophen (TYLENOL) 500 MG tablet Take 1,000 mg by mouth every 6 (six) hours as needed for fever or headache (pain).    Yes [provider]  ketorolac (TORADOL) 10 MG tablet Take 1 tablet (10 mg total) by mouth every 6 (six) hours as needed. 09/11/18  Yes Nat Christen, MD  naproxen sodium (ALEVE) 220 MG tablet Take 220 mg by mouth as needed (pain).   Yes [provider]  ondansetron (ZOFRAN) 4 MG tablet Take 1 tablet (4 mg total) by mouth every 6 (six) hours. 09/11/18  Yes Nat Christen, MD  oxyCODONE-acetaminophen (PERCOCET) 5-325 MG tablet Take 1 tablet by mouth every 4 (four) hours as needed. 09/11/18  Yes Nat Christen, MD  Probiotic Product (PROBIOTIC DAILY PO) Take 1 capsule by mouth daily.   Yes [provider]  PEG-KCl-NaCl-NaSulf-Na Asc-C (PLENVU) 140 g SOLR Take 140 g by  mouth as directed. 09/08/18   Doran Stabler, MD  tamsulosin (FLOMAX) 0.4 MG CAPS capsule Take 1 capsule (0.4 mg total) by mouth daily. Patient not taking: Reported on 09/30/2018 09/11/18   Nat Christen, MD     Vital Signs: BP (!) 113/91   Pulse 93   Temp 98.8 F (37.1 C) (Oral)   Resp 18   Ht 5\' 9"  (1.753 m)   Wt 155 lb (70.3 kg)   SpO2 97%   BMI 22.89 kg/m   Physical Exam awake/alert; chest- sl dim BS left base, right clear, occ wheeze; heart- RRR; abd -soft,+BS,NT; no LE edema.   Imaging: No results found.  Labs:  CBC: Recent Labs    07/17/18 0508 08/13/18 0944 09/11/18 1624 09/30/18 1141  WBC 9.3 10.6* 14.7* 10.7*  HGB 13.8 14.6 13.3 13.6  HCT 40.3 43.8 41.2 42.9  PLT 407* 418* 437* 405*    COAGS: Recent Labs    07/15/18 0227 08/13/18 0944  INR 0.99 0.96    BMP: Recent Labs    07/16/18 0315 07/17/18 0508 08/13/18 0944 09/11/18 1624  NA 134* 134* 140 135  K 3.2* 3.2* 4.0 3.6  CL 101 104 102 99  CO2 24 22 24 25   GLUCOSE 101* 98 108* 107*  BUN <5* <5* <5* 5*  CALCIUM 7.9* 7.9* 8.7* 8.7*  CREATININE 1.05 0.88 0.85 0.99  GFRNONAA >60 >60 >60 >60  GFRAA >60 >60 >60 >60    LIVER FUNCTION TESTS: Recent Labs    07/10/18 1122  BILITOT 0.7  AST 20  ALT 20  ALKPHOS 50  PROT 6.5  ALBUMIN 3.3*    Assessment and Plan: Pt with hx LLQ diverticular abscess drain 07/15/18, removed 08/12/18; s/p LUQ abscess aspiration 12/13 with bacteroides from cx; now with persistent LUQ fluid on latest CT; scheduled today for image guided aspiration/possible drainage of this LUQ fluid; details/risks of procedure, incl but not limited to internal bleeding, infection, injury to adjacent structures d/w pt/wife with their understanding and consent.    Electronically Signed: D. Rowe Robert, PA-C 09/30/2018, 12:31 PM   I spent a total of 25 minutes  at the the patient's bedside AND on the patient's hospital floor or unit, greater than 50% of which was  counseling/coordinating care for image guided aspiration/possible drainage of abdominal fluid collection

## 2018-09-30 NOTE — Procedures (Signed)
Interventional Radiology Procedure Note  Procedure: CT guided aspiration of LUQ fluid.  cx sent Complications: None Recommendations:  - Ok to shower tomorrow - Do not submerge for 7 days - Routine care - 1 hr dc home   Signed,  Dulcy Fanny. Earleen Newport, DO

## 2018-09-30 NOTE — Discharge Instructions (Addendum)
Percutaneous Abscess Drain  Percutaneous abscess drain is removal of a collection of infected fluid inside the body (abscess). This is done by placing a thin needle under the skin and moving it into the abscess. A small tube (catheter) is inserted during the procedure and left in place for a few days to continue to drain the abscess.  Tell a health care provider about:  Any allergies you have.  All medicines you are taking, including vitamins, herbs, eye drops, creams, and over-the-counter medicines.  Any problems you or family members have had with anesthetic medicines.  Any blood disorders you have.  Any surgeries you have had.  Any medical conditions you have.  Whether you are pregnant or may be pregnant.  Any history of tobacco use or smoking.  What are the risks?  Generally, this is a safe procedure. However, problems may occur, including:  Infection.  Bleeding.  Allergic reaction to medicines or materials used.  Damage to other structures or organs.  Blockage of the catheter, requiring placement of a new catheter.  A need to repeat the procedure.  Failure of the procedure to drain the abscess completely, requiring an open surgical procedure to drain the abscess. An open procedure is done through a larger incision.  What happens before the procedure?    Medicines  Ask your health care provider about:  Changing or stopping your regular medicines. This is especially important if you are taking diabetes medicines or blood thinners.  Taking medicines such as aspirin and ibuprofen. These medicines can thin your blood. Do not take these medicines before your procedure if your health care provider instructs you not to.  Staying hydrated  Follow instructions from your health care provider about hydration, which may include:  Up to 2 hours before the procedure - you may continue to drink clear liquids, such as water, clear fruit juice, black coffee, and plain tea.  Eating and drinking restrictions  Follow instructions  from your health care provider about eating and drinking, which may include:  8 hours before the procedure - stop eating heavy meals or foods such as meat, fried foods, or fatty foods.  6 hours before the procedure - stop eating light meals or foods, such as toast or cereal.  6 hours before the procedure - stop drinking milk or drinks that contain milk.  2 hours before the procedure - stop drinking clear liquids.  General instructions  Plan to have someone take you home from the hospital or clinic.  If you will be going home right after the procedure, plan to have someone with you for 24 hours.  You may have blood tests or urine tests.  You may get a tetanus shot.  You may have imaging tests, such as an ultrasound, to check how large or deep your abscess is.  What happens during the procedure?  To lower your risk of infection:  Your health care team will wash or sanitize their hands.  The skin around the abscess will be washed with soap.  Hair may be removed from the surgical site.  An IV tube will be inserted into one of your veins.  You will be given medicine to numb the area (local anesthetic) where the catheter will be placed. Placement of the catheter varies depending on where your abscess is located.  You may be given medicine to help you relax (sedative) or medicine to make you fall asleep (general anesthetic).  A small incision will be made in your skin.    A needle will be inserted under your skin and moved into the abscess. Images from ultrasound, X-ray, or a CT scan will be used to help guide the needle to the abscess.  A catheter will be inserted into your incision and moved underneath your skin until it reaches the abscess. Images will be used to help guide the catheter to the abscess.  After the catheter is in place, the needle will be removed. The catheter will be connected to a bag outside of your body. The catheter will stay in place until the fluid has stopped draining and the infection is gone.  The  procedure may vary among health care providers and hospitals.  What happens after the procedure?  Your blood pressure, heart rate, breathing rate, and blood oxygen level will be monitored until the medicines you were given have worn off.  You may have some pain or nausea. Medicines will be available to help you.  Summary  An abscess is a collection of infected fluid inside the body.  During this procedure, images from ultrasound, X-rays, or a CT scan are used to help guide the needle and catheter to the abscess.  A catheter will be left in place to continue to drain the abscess after the procedure.  This information is not intended to replace advice given to you by your health care provider. Make sure you discuss any questions you have with your health care provider.  Document Released: 01/02/2014 Document Revised: 07/10/2016 Document Reviewed: 07/10/2016  Elsevier Interactive Patient Education  2019 [employer]    Moderate Conscious Sedation, Adult, Care After  These instructions provide you with information about caring for yourself after your procedure. Your health care provider may also give you more specific instructions. Your treatment has been planned according to current medical practices, but problems sometimes occur. Call your health care provider if you have any problems or questions after your procedure.  What can I expect after the procedure?  After your procedure, it is common:  To feel sleepy for several hours.  To feel clumsy and have poor balance for several hours.  To have poor judgment for several hours.  To vomit if you eat too soon.  Follow these instructions at home:  For at least 24 hours after the procedure:    Do not:  Participate in activities where you could fall or become injured.  Drive.  Use heavy machinery.  Drink alcohol.  Take sleeping pills or medicines that cause drowsiness.  Make important decisions or sign legal documents.  Take care of children on your own.  Rest.  Eating and  drinking  Follow the diet recommended by your health care provider.  If you vomit:  Drink water, juice, or soup when you can drink without vomiting.  Make sure you have little or no nausea before eating solid foods.  General instructions  Have a responsible adult stay with you until you are awake and alert.  Take over-the-counter and prescription medicines only as told by your health care provider.  If you smoke, do not smoke without supervision.  Keep all follow-up visits as told by your health care provider. This is important.  Contact a health care provider if:  You keep feeling nauseous or you keep vomiting.  You feel light-headed.  You develop a rash.  You have a fever.  Get help right away if:  You have trouble breathing.  This information is not intended to replace advice given to you by your health care

## 2018-10-03 LAB — AEROBIC/ANAEROBIC CULTURE W GRAM STAIN (SURGICAL/DEEP WOUND)

## 2018-10-03 LAB — AEROBIC/ANAEROBIC CULTURE (SURGICAL/DEEP WOUND)

## 2018-10-22 ENCOUNTER — Other Ambulatory Visit: Payer: Self-pay

## 2018-10-22 ENCOUNTER — Other Ambulatory Visit: Payer: Self-pay | Admitting: Student

## 2018-10-22 ENCOUNTER — Encounter (HOSPITAL_COMMUNITY): Payer: Self-pay

## 2018-10-22 ENCOUNTER — Inpatient Hospital Stay (HOSPITAL_COMMUNITY)
Admission: AD | Admit: 2018-10-22 | Discharge: 2018-10-26 | DRG: 392 | Disposition: A | Discharge status: Home Health | Payer: BLUE CROSS/BLUE SHIELD | Source: Ambulatory Visit | Attending: General Surgery | Admitting: General Surgery

## 2018-10-22 ENCOUNTER — Ambulatory Visit
Admission: RE | Admit: 2018-10-22 | Discharge: 2018-10-22 | Disposition: A | Discharge status: Home / Self Care | Payer: BLUE CROSS/BLUE SHIELD | Source: Ambulatory Visit | Attending: Student | Admitting: Student

## 2018-10-22 DIAGNOSIS — B961 Klebsiella pneumoniae [K. pneumoniae] as the cause of diseases classified elsewhere: Secondary | ICD-10-CM | POA: Diagnosis present

## 2018-10-22 DIAGNOSIS — R109 Unspecified abdominal pain: Secondary | ICD-10-CM

## 2018-10-22 DIAGNOSIS — Z791 Long term (current) use of non-steroidal anti-inflammatories (NSAID): Secondary | ICD-10-CM | POA: Diagnosis not present

## 2018-10-22 DIAGNOSIS — Z79899 Other long term (current) drug therapy: Secondary | ICD-10-CM

## 2018-10-22 DIAGNOSIS — K651 Peritoneal abscess: Secondary | ICD-10-CM

## 2018-10-22 DIAGNOSIS — Z79891 Long term (current) use of opiate analgesic: Secondary | ICD-10-CM

## 2018-10-22 DIAGNOSIS — K5720 Diverticulitis of large intestine with perforation and abscess without bleeding: Secondary | ICD-10-CM | POA: Diagnosis present

## 2018-10-22 DIAGNOSIS — R188 Other ascites: Secondary | ICD-10-CM | POA: Diagnosis present

## 2018-10-22 LAB — COMPREHENSIVE METABOLIC PANEL
ALBUMIN: 3.3 g/dL — AB (ref 3.5–5.0)
ALT: 14 U/L (ref 0–44)
AST: 12 U/L — ABNORMAL LOW (ref 15–41)
Alkaline Phosphatase: 60 U/L (ref 38–126)
Anion gap: 8 (ref 5–15)
BUN: 6 mg/dL (ref 6–20)
CALCIUM: 8.4 mg/dL — AB (ref 8.9–10.3)
CO2: 27 mmol/L (ref 22–32)
Chloride: 99 mmol/L (ref 98–111)
Creatinine, Ser: 0.74 mg/dL (ref 0.61–1.24)
GFR calc Af Amer: >60 mL/min (ref >60)
GFR calc non Af Amer: >60 mL/min (ref >60)
Glucose, Bld: 91 mg/dL (ref 70–99)
Potassium: 3.7 mmol/L (ref 3.5–5.1)
Sodium: 134 mmol/L — ABNORMAL LOW (ref 135–145)
Total Bilirubin: 0.8 mg/dL (ref 0.3–1.2)
Total Protein: 6.4 g/dL — ABNORMAL LOW (ref 6.5–8.1)

## 2018-10-22 LAB — URINALYSIS, ROUTINE W REFLEX MICROSCOPIC
Bilirubin Urine: NEGATIVE
Glucose, UA: NEGATIVE mg/dL
Hgb urine dipstick: NEGATIVE
Ketones, ur: 5 mg/dL — AB
Leukocytes,Ua: NEGATIVE
Nitrite: NEGATIVE
PH: 5 (ref 5.0–8.0)
Protein, ur: NEGATIVE mg/dL
Specific Gravity, Urine: >1.046 — ABNORMAL HIGH (ref 1.005–1.030)

## 2018-10-22 LAB — CBC WITH DIFFERENTIAL/PLATELET
Abs Immature Granulocytes: 0.03 10*3/uL (ref 0.00–0.07)
Basophils Absolute: 0 10*3/uL (ref 0.0–0.1)
Basophils Relative: 0 %
EOS ABS: 0 10*3/uL (ref 0.0–0.5)
Eosinophils Relative: 0 %
HCT: 40.9 % (ref 39.0–52.0)
Hemoglobin: 12.6 g/dL — ABNORMAL LOW (ref 13.0–17.0)
Immature Granulocytes: 0 %
LYMPHS ABS: 1.7 10*3/uL (ref 0.7–4.0)
Lymphocytes Relative: 13 %
MCH: 28.2 pg (ref 26.0–34.0)
MCHC: 30.8 g/dL (ref 30.0–36.0)
MCV: 91.5 fL (ref 80.0–100.0)
Monocytes Absolute: 1 10*3/uL (ref 0.1–1.0)
Monocytes Relative: 8 %
Neutro Abs: 9.9 10*3/uL — ABNORMAL HIGH (ref 1.7–7.7)
Neutrophils Relative %: 79 %
Platelets: 420 10*3/uL — ABNORMAL HIGH (ref 150–400)
RBC: 4.47 MIL/uL (ref 4.22–5.81)
RDW: 15.9 % — ABNORMAL HIGH (ref 11.5–15.5)
WBC: 12.7 10*3/uL — ABNORMAL HIGH (ref 4.0–10.5)
nRBC: 0 % (ref 0.0–0.2)

## 2018-10-22 MED ORDER — METOPROLOL TARTRATE 5 MG/5ML IV SOLN: 5.0000 mg | Freq: Four times a day (QID) | INTRAVENOUS | Status: DC | PRN

## 2018-10-22 MED ORDER — POLYETHYLENE GLYCOL 3350 17 G PO PACK: 17.0000 g | PACK | Freq: Every day | ORAL | Status: DC | PRN

## 2018-10-22 MED ORDER — KETOROLAC TROMETHAMINE 30 MG/ML IJ SOLN
30.0000 mg | Freq: Four times a day (QID) | INTRAMUSCULAR | Status: DC | PRN
  Filled 2018-10-22: qty 1

## 2018-10-22 MED ORDER — ONDANSETRON HCL 4 MG/2ML IJ SOLN: 4.0000 mg | Freq: Four times a day (QID) | INTRAMUSCULAR | Status: DC | PRN

## 2018-10-22 MED ORDER — HYDROMORPHONE HCL 1 MG/ML IJ SOLN
0.5000 mg | INTRAMUSCULAR | Status: DC | PRN
  Administered 2018-10-23 – 2018-10-25 (×3): 0.5 mg via INTRAVENOUS
  Filled 2018-10-22 (×3): qty 0.5

## 2018-10-22 MED ORDER — DOCUSATE SODIUM 100 MG PO CAPS
100.0000 mg | ORAL_CAPSULE | Freq: Two times a day (BID) | ORAL | Status: DC
  Administered 2018-10-22 – 2018-10-26 (×8): 100 mg via ORAL
  Filled 2018-10-22 (×8): qty 1

## 2018-10-22 MED ORDER — DEXTROSE-NACL 5-0.45 % IV SOLN
INTRAVENOUS | Status: DC
  Administered 2018-10-22 – 2018-10-25 (×4): via INTRAVENOUS

## 2018-10-22 MED ORDER — ACETAMINOPHEN 650 MG RE SUPP: 650.0000 mg | Freq: Four times a day (QID) | RECTAL | Status: DC | PRN

## 2018-10-22 MED ORDER — OXYCODONE HCL 5 MG PO TABS
5.0000 mg | ORAL_TABLET | ORAL | Status: DC | PRN
  Administered 2018-10-22 – 2018-10-23 (×2): 5 mg via ORAL
  Filled 2018-10-22: qty 1
  Filled 2018-10-22: qty 2

## 2018-10-22 MED ORDER — IOPAMIDOL (ISOVUE-300) INJECTION 61%
100.0000 mL | Freq: Once | INTRAVENOUS | Status: AC | PRN
  Administered 2018-10-22: 100 mL via INTRAVENOUS

## 2018-10-22 MED ORDER — ONDANSETRON 4 MG PO TBDP: 4.0000 mg | ORAL_TABLET | Freq: Four times a day (QID) | ORAL | Status: DC | PRN

## 2018-10-22 MED ORDER — PIPERACILLIN-TAZOBACTAM 3.375 G IVPB
3.3750 g | Freq: Three times a day (TID) | INTRAVENOUS | Status: DC
  Administered 2018-10-22 – 2018-10-25 (×9): 3.375 g via INTRAVENOUS
  Filled 2018-10-22 (×9): qty 50

## 2018-10-22 MED ORDER — ENOXAPARIN SODIUM 40 MG/0.4ML ~~LOC~~ SOLN
40.0000 mg | SUBCUTANEOUS | Status: DC
  Administered 2018-10-22 – 2018-10-25 (×4): 40 mg via SUBCUTANEOUS
  Filled 2018-10-22 (×4): qty 0.4

## 2018-10-22 MED ORDER — ACETAMINOPHEN 325 MG PO TABS: 650.0000 mg | ORAL_TABLET | Freq: Four times a day (QID) | ORAL | Status: DC | PRN

## 2018-10-22 NOTE — Progress Notes (Signed)
Patient ID: Douglas Weber, male   DOB: 1970-09-03, 48 y.o.   MRN: [id number]   I have seen and examined the patient and agree with the assessment and plans. I discussed the CT findings with him and his wife.  IR is seeing him and plan on placing drains in the collections tomorrow.    Hellon Vaccarella A. Ninfa Linden  MD, FACS

## 2018-10-22 NOTE — H&P (Signed)
Ranelle Oyster Documented: 10/22/2018 11:02 AM Location: [facility] Patient #: [id number] DOB: 08/13/1971 Married / Language: English / Race: White Male  History of Present Illness  The patient is a 48 year old male who presents with history of perforated diverticulitis with left lower quadrant abscess, treated with a drain on 07/15/18. His primary abscess cavity completely resolved so the drain was removed on 08/12/18, but there was a loculated fluid collection along the left upper quadrant in the perisplenic region so this fluid collection was aspirated growing bacteroides fragilis. He was placed on Augmentin.   On 09/11/18, he presented to the ER with back pain. CT scan revealed renal stone and recurrent left upper quadrant fluid collection. On 09/30/18, 100 cc of turbid fluid was aspirated growing several enteric bacteria and he was given Cipro/Flagyl. On 10/15/18, he followed up with Dr. Kae Heller for a new lump at his LLQ drain site which she thought was a hematoma/seroma.  He is presenting today with concerns of redness, swelling, and pain around his LLQ drain site x 2 days. He states it is difficult to walk without pain. He denies drainage from the site. He states he had a temp of 101.65F yesterday. Denies nausea/vomiting. Denies constipation/diarrhea.  Allergies  No Known Drug Allergies [08/11/2018]: Allergies Reconciled   Medication History  No Current Medications Medications Reconciled   Review of Systems General:Fever Abdomen:Pain, mass  Vitals  10/22/2018 11:03 AM Weight: 155 lb Height: 68in Body Surface Area: 1.83 m Body Mass Index: 23.57 kg/m  Temp.: 99.75F(Oral)  Pulse: 112 (Regular)  BP: 130/84 (Sitting, Left Arm, Standard)  Physical Exam  GENERAL: Well-developed, well nourished male in no acute distress  EYES: No scleral icterus Pupils equal, lids normal  EXTERNAL EARS: Intact, no masses or lesions EXTERNAL NOSE: Intact, no  masses or lesions MOUTH: Lips - no lesions Dentition - normal for age  RESPIRATORY: Normal effort, no use of accessory muscles  MUSCULOSKELETAL: Normal gait Grossly normal ROM upper extremities Grossly normal ROM lower extremities  SKIN: Warm and dry Not diaphoretic  PSYCHIATRIC: Normal judgement and insight Normal mood and affect Alert, oriented x 3  Abdomen Left lower quadrant: Presumed prior drain site, there is a 1 cm area of obvious fluctuance surrounded by a small amount of erythema. There is about 4 cm of surrounding induration.  Remainder of abdomen is unremarkable without tenderness to palpation.  I placed a field block with local anaesthetic. I incised the skin over the abscess to release the infection. A moderate amount of thick, purulent material was expelled. I excised the skin to allow an adequate opening for drainage & prevent skin reclosure. I packed the wound with ribbon NU-Gauze. The patient tolerated the procedure well. There were no immediate complications.    Assessment & Plan  ABSCESS (L02.91) Past medical history of perforated diverticulitis with abscess, treated with a drain and multiple aspirations for intra-abdominal fluid collections, presenting with an abscess at his previous drain site. This was incised and drained today, releasing a small to moderate amount of purulent material. I spoke to a surgeon in the office given his recent fevers and history of multiple fluid collections, and was advised to obtain a CT scan with IV and oral contrast to rule out deeper fluid collection and/or fistula. I prescribed Augmentin for now but we also obtained a wound culture. We will contact him once the results are in and he'll follow up with Dr. Kae Heller next week.  Addendum:  Dr. Kae Heller reviewed the  CT scan images and states several large fluid collections containing air pockets are seen within the abdomen.  Given his history of multiple intra-abdominal fluid  collections growing several different types of bacteria, she believes the best treatment plan for him is to be admitted for IV abx and possibly a Hartman's procedure to resolve this issue.  I spoke to the patient who agrees with this plan.  He will stay NPO until he is evaluated by our team at [facility].  IMPRESSION: There is a redemonstrated multiloculated elongated air and fluid collection which spans in multiple components from the spleen, along the course of the descending colon and anterior to the sigmoid. There are multiple components which to communicate with the colon, including the mid sigmoid (series 2, image 62), and the mid descending colon (series 2, image 45). The dominant components are about the spleen and anterior to the mid sigmoid colon, measuring approximately 8.2 x 2.4 cm and 5.8 x 3.2 cm respectively. Appearance and configuration of these fluid collections is not simply changed compared to prior CT dated 09/11/2018. There are small fluid loculations in the subcutaneous and abdominal wall tissues overlying the lower collection, presumably along a prior drainage tract (series 2, image 58). Overall findings are consistent with persistent intra-abdominal abscess.  Signed electronically by Kellie Shropshire, PA C (10/22/2018 1:36 PM)

## 2018-10-22 NOTE — Progress Notes (Signed)
Referring Physician(s): Blackman,D  Supervising Physician: Henn,A  Patient Status:  [facility] IP  Chief Complaint:  Abdominal pain/abscesses  Subjective: Pt familiar to IR service from prior drainage of a LLQ diverticular abscess on 07/15/18 followed by drain removal on 08/12/18 and aspiration of residual left abdominal abscess on 08/13/18 and 09/30/18. He now presents with persistent abdominal pain, fever and imaging findings of persistent left abdominal abscesses. Request received from surgery for image guided drainage of these abscesses. He denies CP,dyspnea, cough,N/V or bleeding.       Allergies: Patient has no known allergies.  Medications: Prior to Admission medications   Medication Sig Start Date End Date Taking? Authorizing Provider  acetaminophen (TYLENOL) 500 MG tablet Take 1,000 mg by mouth every 6 (six) hours as needed for fever or headache (pain).    Yes [provider]  ketorolac (TORADOL) 10 MG tablet Take 1 tablet (10 mg total) by mouth every 6 (six) hours as needed. Patient taking differently: Take 10 mg by mouth every 6 (six) hours as needed for moderate pain or severe pain.  09/11/18  Yes Nat Christen, MD  naproxen sodium (ALEVE) 220 MG tablet Take 220 mg by mouth as needed (pain).   Yes [provider]  oxyCODONE-acetaminophen (PERCOCET) 5-325 MG tablet Take 1 tablet by mouth every 4 (four) hours as needed. Patient taking differently: Take 1 tablet by mouth every 4 (four) hours as needed for moderate pain or severe pain.  09/11/18  Yes Nat Christen, MD  amoxicillin-clavulanate (AUGMENTIN) 875-125 MG tablet Take 1 tablet by mouth 2 (two) times daily.  10/22/18   [provider]  ondansetron (ZOFRAN) 4 MG tablet Take 1 tablet (4 mg total) by mouth every 6 (six) hours. Patient not taking: Reported on 10/22/2018 09/11/18   Nat Christen, MD  PEG-KCl-NaCl-NaSulf-Na Asc-C (PLENVU) 140 g SOLR Take 140 g by mouth as directed. 09/08/18   Doran Stabler,  MD  tamsulosin (FLOMAX) 0.4 MG CAPS capsule Take 1 capsule (0.4 mg total) by mouth daily. Patient not taking: Reported on 09/30/2018 09/11/18   Nat Christen, MD     Vital Signs: BP 127/74 (BP Location: Right Arm)   Pulse 80   Temp 99.9 F (37.7 C) (Oral)   Resp 18   Ht 5\' 7"  (1.702 m)   Wt 169 lb (76.7 kg)   SpO2 97%   BMI 26.47 kg/m   Physical Exam awake/alert; chest- sl dim BS bases; heart- RRR; abd soft,+BS, tender left upper/lower abd regions with erythematous draining wound LLQ ,s/p I&D earlier by [facility]; no LE edema  Imaging: Ct Abdomen Pelvis W Contrast  Result Date: 10/22/2018 CLINICAL DATA:  History of diverticulitis and drain placement, recurrent pain EXAM: CT ABDOMEN AND PELVIS WITH CONTRAST TECHNIQUE: Multidetector CT imaging of the abdomen and pelvis was performed using the standard protocol following bolus administration of intravenous contrast. CONTRAST:  149mL ISOVUE-300 IOPAMIDOL (ISOVUE-300) INJECTION 61% COMPARISON:  09/30/2018, 09/11/2018 FINDINGS: Lower chest: Small left pleural effusion. Hepatobiliary: No focal liver abnormality is seen. No gallstones, gallbladder wall thickening, or biliary dilatation. Pancreas: Unremarkable. No pancreatic ductal dilatation or surrounding inflammatory changes. Spleen: Normal in size without focal abnormality. Adrenals/Urinary Tract: Adrenal glands are unremarkable. Kidneys are normal, without renal calculi, focal lesion, or hydronephrosis. Bladder is unremarkable. Stomach/Bowel: Stomach is within normal limits. Appendix appears normal. No evidence of bowel wall thickening, distention, or inflammatory changes. Sigmoid diverticulosis. Vascular/Lymphatic: Calcific atherosclerosis. No enlarged abdominal or pelvic lymph nodes. Reproductive: No mass or other  abnormality. Other: No abdominal wall hernia or abnormality. There is a redemonstrated multiloculated elongated air and fluid collection which spans in multiple components from the spleen, along  the course of the descending colon and anterior to the sigmoid. There are multiple components which to communicate with the colon, including the mid sigmoid (series 2, image 62), and the mid descending colon (series 2, image 45). The dominant components are about the spleen and anterior to the mid sigmoid colon, measuring approximately 8.2 x 2.4 cm and 5.8 x 3.2 cm respectively. Appearance and configuration of these fluid collections is not simply changed compared to prior CT dated 09/11/2018. There are small fluid loculation in the subcutaneous and abdominal wall tissues overlying the lower collection, presumably along a prior drainage tract (series 2, image 58). Musculoskeletal: No acute or significant osseous findings. IMPRESSION: There is a redemonstrated multiloculated elongated air and fluid collection which spans in multiple components from the spleen, along the course of the descending colon and anterior to the sigmoid. There are multiple components which to communicate with the colon, including the mid sigmoid (series 2, image 62), and the mid descending colon (series 2, image 45). The dominant components are about the spleen and anterior to the mid sigmoid colon, measuring approximately 8.2 x 2.4 cm and 5.8 x 3.2 cm respectively. Appearance and configuration of these fluid collections is not simply changed compared to prior CT dated 09/11/2018. There are small fluid loculations in the subcutaneous and abdominal wall tissues overlying the lower collection, presumably along a prior drainage tract (series 2, image 58). Overall findings are consistent with persistent intra-abdominal abscess. Electronically Signed   By: Eddie Candle M.D.   On: 10/22/2018 15:08    Labs:  CBC: Recent Labs    08/13/18 0944 09/11/18 1624 09/30/18 1141 10/22/18 1556  WBC 10.6* 14.7* 10.7* 12.7*  HGB 14.6 13.3 13.6 12.6*  HCT 43.8 41.2 42.9 40.9  PLT 418* 437* 405* 420*    COAGS: Recent Labs    07/15/18 0227  08/13/18 0944 09/30/18 1141  INR 0.99 0.96 0.98    BMP: Recent Labs    08/13/18 0944 09/11/18 1624 09/30/18 1141 10/22/18 1556  NA 140 135 136 134*  K 4.0 3.6 4.3 3.7  CL 102 99 103 99  CO2 24 25 24 27   GLUCOSE 108* 107* 101* 91  BUN <5* 5* <5* 6  CALCIUM 8.7* 8.7* 8.6* 8.4*  CREATININE 0.85 0.99 0.76 0.74  GFRNONAA >60 >60 >60 >60  GFRAA >60 >60 >60 >60    LIVER FUNCTION TESTS: Recent Labs    07/10/18 1122 09/30/18 1141 10/22/18 1556  BILITOT 0.7 0.8 0.8  AST 20 17 12*  ALT 20 9 14   ALKPHOS 50 57 60  PROT 6.5 6.7 6.4*  ALBUMIN 3.3* 2.9* 3.3*    Assessment and Plan: Pt with hx perforated diverticultis, s/p left abd abscess drain 07/15/18, removal on 08/12/18 followed by aspiration of LUQ abscess on 08/13/18 and 09/30/18; prev cx's grew streptococcus,bacteroides and prevotella; now with mild leukocytosis,temp elevation and imaging findings of recurrent left abd abscesses; request received for image guided drainage of these abscesses. Imaging studies have been reviewed by Dr. Anselm Pancoast.Risks and benefits discussed with the patient/spouse including bleeding, infection, damage to adjacent structures, bowel perforation/fistula connection, and sepsis.  All of the patient's questions were answered, patient is agreeable to proceed. Consent signed and in chart.  Procedure scheduled for 10/23/18   Electronically Signed: D. Rowe Robert, PA-C 10/22/2018, 8:32 PM   I  spent a total of 25 minutes at the the patient's bedside AND on the patient's hospital floor or unit, greater than 50% of which was counseling/coordinating care for CT guided drainage of left abdominal abscesses    Patient ID: Douglas Weber, male   DOB: 1971-01-02, 48 y.o.   MRN: [id number]

## 2018-10-22 NOTE — H&P (Signed)
Douglas Weber Documented: 10/22/2018 11:02 AM Location: [facility] Patient #: [id number] DOB: April 11, 1971 Married / Language: English / Race: White Male  History of Present Illness  The patient is a 48 year old male who presents with history of perforated diverticulitis with left lower quadrant abscess, treated with a drain on 07/15/18. His primary abscess cavity completely resolved so the drain was removed on 08/12/18, but there was a loculated fluid collection along the left upper quadrant in the perisplenic region so this fluid collection was aspirated growing bacteroides fragilis. He was placed on Augmentin.   On 09/11/18, he presented to the ER with back pain. CT scan revealed renal stone and recurrent left upper quadrant fluid collection. On 09/30/18, 100 cc of turbid fluid was aspirated growing several enteric bacteria and he was given Cipro/Flagyl. On 10/15/18, he followed up with Dr. Kae Heller for a new lump at his LLQ drain site which she thought was a hematoma/seroma.  He is presenting today with concerns of redness, swelling, and pain around his LLQ drain site x 2 days. He states it is difficult to walk without pain. He denies drainage from the site. He states he had a temp of 101.27F yesterday. Denies nausea/vomiting. Denies constipation/diarrhea.  Allergies  No Known Drug Allergies [08/11/2018]: Allergies Reconciled   Medication History  No Current Medications Medications Reconciled   Review of Systems General:Fever Abdomen:Pain, mass  Vitals  10/22/2018 11:03 AM Weight: 155 lb Height: 68in Body Surface Area: 1.83 m Body Mass Index: 23.57 kg/m  Temp.: 99.88F(Oral)  Pulse: 112 (Regular)  BP: 130/84 (Sitting, Left Arm, Standard)  Physical Exam  GENERAL: Well-developed, well nourished male in no acute distress  EYES: No scleral icterus Pupils equal, lids normal  EXTERNAL EARS: Intact, no masses or lesions EXTERNAL NOSE: Intact, no  masses or lesions MOUTH: Lips - no lesions Dentition - normal for age  RESPIRATORY: Normal effort, no use of accessory muscles  MUSCULOSKELETAL: Normal gait Grossly normal ROM upper extremities Grossly normal ROM lower extremities  SKIN: Warm and dry Not diaphoretic  PSYCHIATRIC: Normal judgement and insight Normal mood and affect Alert, oriented x 3  Abdomen Left lower quadrant: Presumed prior drain site, there is a 1 cm area of obvious fluctuance surrounded by a small amount of erythema. There is about 4 cm of surrounding induration.  Remainder of abdomen is unremarkable without tenderness to palpation.  I placed a field block with local anaesthetic. I incised the skin over the abscess to release the infection. A moderate amount of thick, purulent material was expelled. I excised the skin to allow an adequate opening for drainage & prevent skin reclosure. I packed the wound with ribbon NU-Gauze. The patient tolerated the procedure well. There were no immediate complications.    Assessment & Plan  ABSCESS (L02.91) Past medical history of perforated diverticulitis with abscess, treated with a drain and multiple aspirations for intra-abdominal fluid collections, presenting with an abscess at his previous drain site. This was incised and drained today, releasing a small to moderate amount of purulent material. I spoke to a surgeon in the office given his recent fevers and history of multiple fluid collections, and was advised to obtain a CT scan with IV and oral contrast to rule out deeper fluid collection and/or fistula. I prescribed Augmentin for now but we also obtained a wound culture. We will contact him once the results are in and he'll follow up with Dr. Kae Heller next week.  Addendum:  Dr. Kae Heller reviewed the  CT scan images and states several large fluid collections containing air pockets are seen within the abdomen.  Given his history of multiple intra-abdominal fluid  collections growing several different types of bacteria, she believes the best treatment plan for him is to be admitted for IV abx and possibly a Hartman's procedure to resolve this issue.  I spoke to the patient who agrees with this plan.  He will stay NPO until he is evaluated by our team at [facility]  IMPRESSION: There is a redemonstrated multiloculated elongated air and fluid collection which spans in multiple components from the spleen, along the course of the descending colon and anterior to the sigmoid. There are multiple components which to communicate with the colon, including the mid sigmoid (series 2, image 62), and the mid descending colon (series 2, image 45). The dominant components are about the spleen and anterior to the mid sigmoid colon, measuring approximately 8.2 x 2.4 cm and 5.8 x 3.2 cm respectively. Appearance and configuration of these fluid collections is not simply changed compared to prior CT dated 09/11/2018. There are small fluid loculations in the subcutaneous and abdominal wall tissues overlying the lower collection, presumably along a prior drainage tract (series 2, image 58). Overall findings are consistent with persistent intra-abdominal abscess.  Signed electronically by Kellie Shropshire, PA C (10/22/2018 1:36 PM)

## 2018-10-23 ENCOUNTER — Inpatient Hospital Stay (HOSPITAL_COMMUNITY): Payer: BLUE CROSS/BLUE SHIELD

## 2018-10-23 LAB — BASIC METABOLIC PANEL
Anion gap: 8 (ref 5–15)
BUN: 6 mg/dL (ref 6–20)
CO2: 26 mmol/L (ref 22–32)
Calcium: 8.5 mg/dL — ABNORMAL LOW (ref 8.9–10.3)
Chloride: 99 mmol/L (ref 98–111)
Creatinine, Ser: 0.82 mg/dL (ref 0.61–1.24)
GFR calc Af Amer: >60 mL/min (ref >60)
GFR calc non Af Amer: >60 mL/min (ref >60)
GLUCOSE: 89 mg/dL (ref 70–99)
Potassium: 3.8 mmol/L (ref 3.5–5.1)
Sodium: 133 mmol/L — ABNORMAL LOW (ref 135–145)

## 2018-10-23 LAB — CBC
HCT: 40.7 % (ref 39.0–52.0)
HEMOGLOBIN: 12.5 g/dL — AB (ref 13.0–17.0)
MCH: 28.3 pg (ref 26.0–34.0)
MCHC: 30.7 g/dL (ref 30.0–36.0)
MCV: 92.1 fL (ref 80.0–100.0)
Platelets: 362 10*3/uL (ref 150–400)
RBC: 4.42 MIL/uL (ref 4.22–5.81)
RDW: 15.8 % — ABNORMAL HIGH (ref 11.5–15.5)
WBC: 8.3 10*3/uL (ref 4.0–10.5)
nRBC: 0 % (ref 0.0–0.2)

## 2018-10-23 MED ORDER — FENTANYL CITRATE (PF) 100 MCG/2ML IJ SOLN
INTRAMUSCULAR | Status: AC | PRN
  Administered 2018-10-23: 50 ug via INTRAVENOUS
  Administered 2018-10-23 (×4): 25 ug via INTRAVENOUS

## 2018-10-23 MED ORDER — MIDAZOLAM HCL 2 MG/2ML IJ SOLN
INTRAMUSCULAR | Status: AC | PRN
  Administered 2018-10-23: 1 mg via INTRAVENOUS
  Administered 2018-10-23: 0.5 mg via INTRAVENOUS
  Administered 2018-10-23: 1 mg via INTRAVENOUS
  Administered 2018-10-23: 0.5 mg via INTRAVENOUS

## 2018-10-23 MED ORDER — MIDAZOLAM HCL 2 MG/2ML IJ SOLN
INTRAMUSCULAR | Status: AC
  Filled 2018-10-23: qty 4

## 2018-10-23 MED ORDER — FENTANYL CITRATE (PF) 100 MCG/2ML IJ SOLN
INTRAMUSCULAR | Status: AC
  Filled 2018-10-23: qty 4

## 2018-10-23 MED ORDER — SODIUM CHLORIDE 0.9% FLUSH
5.0000 mL | Freq: Three times a day (TID) | INTRAVENOUS | Status: DC
  Administered 2018-10-23 – 2018-10-26 (×8): 5 mL

## 2018-10-23 NOTE — Procedures (Signed)
Interventional Radiology Procedure:   Indications: Recurrent colonic diverticular abscesses  Procedure: CT guided drain placement in left upper quadrant abscess and left lower quadrant abscess  Findings: Placed 10 Fr drain in both abscess collections.  Removed 30 ml of bloody purulent fluid from LUQ.  40 ml of yellow purulent from LLQ.  Complications: None     EBL: Minimal  Plan: Follow drain output, regular drain flushing, send fluid for culture.     Sahib Pella R. Anselm Pancoast, MD  Pager: [phone]

## 2018-10-23 NOTE — Progress Notes (Signed)
Subjective/Chief Complaint: Complains of LLQ pain and feeling hungry   Objective: Vital signs in last 24 hours: Temp:  [99.1 F (37.3 C)-100.7 F (38.2 C)] 100.7 F (38.2 C) (02/22 0517) Pulse Rate:  [77-89] 83 (02/22 0517) Resp:  [16-18] 16 (02/22 0517) BP: (111-130)/(64-79) 122/67 (02/22 0517) SpO2:  [97 %-100 %] 98 % (02/22 0517) Weight:  [70.7 kg-76.7 kg] 70.7 kg (02/22 0517) Last BM Date: 10/22/18  Intake/Output from previous day: 02/21 0701 - 02/22 0700 In: 1095.4 [P.O.:360; I.V.:660.3; IV Piggyback:75.2] Out: 300 [Urine:300] Intake/Output this shift: No intake/output data recorded.  General appearance: alert and cooperative Resp: clear to auscultation bilaterally Cardio: regular rate and rhythm GI: soft, focal tenderness LLQ with draining site from previous drain  Lab Results:  Recent Labs    10/22/18 1556 10/23/18 0330  WBC 12.7* 8.3  HGB 12.6* 12.5*  HCT 40.9 40.7  PLT 420* 362   BMET Recent Labs    10/22/18 1556 10/23/18 0330  NA 134* 133*  K 3.7 3.8  CL 99 99  CO2 27 26  GLUCOSE 91 89  BUN 6 6  CREATININE 0.74 0.82  CALCIUM 8.4* 8.5*   PT/INR No results for input(s): LABPROT, INR in the last 72 hours. ABG No results for input(s): PHART, HCO3 in the last 72 hours.  Invalid input(s): PCO2, PO2  Studies/Results: Ct Abdomen Pelvis W Contrast  Result Date: 10/22/2018 CLINICAL DATA:  History of diverticulitis and drain placement, recurrent pain EXAM: CT ABDOMEN AND PELVIS WITH CONTRAST TECHNIQUE: Multidetector CT imaging of the abdomen and pelvis was performed using the standard protocol following bolus administration of intravenous contrast. CONTRAST:  130mL ISOVUE-300 IOPAMIDOL (ISOVUE-300) INJECTION 61% COMPARISON:  09/30/2018, 09/11/2018 FINDINGS: Lower chest: Small left pleural effusion. Hepatobiliary: No focal liver abnormality is seen. No gallstones, gallbladder wall thickening, or biliary dilatation. Pancreas: Unremarkable. No  pancreatic ductal dilatation or surrounding inflammatory changes. Spleen: Normal in size without focal abnormality. Adrenals/Urinary Tract: Adrenal glands are unremarkable. Kidneys are normal, without renal calculi, focal lesion, or hydronephrosis. Bladder is unremarkable. Stomach/Bowel: Stomach is within normal limits. Appendix appears normal. No evidence of bowel wall thickening, distention, or inflammatory changes. Sigmoid diverticulosis. Vascular/Lymphatic: Calcific atherosclerosis. No enlarged abdominal or pelvic lymph nodes. Reproductive: No mass or other abnormality. Other: No abdominal wall hernia or abnormality. There is a redemonstrated multiloculated elongated air and fluid collection which spans in multiple components from the spleen, along the course of the descending colon and anterior to the sigmoid. There are multiple components which to communicate with the colon, including the mid sigmoid (series 2, image 62), and the mid descending colon (series 2, image 45). The dominant components are about the spleen and anterior to the mid sigmoid colon, measuring approximately 8.2 x 2.4 cm and 5.8 x 3.2 cm respectively. Appearance and configuration of these fluid collections is not simply changed compared to prior CT dated 09/11/2018. There are small fluid loculation in the subcutaneous and abdominal wall tissues overlying the lower collection, presumably along a prior drainage tract (series 2, image 58). Musculoskeletal: No acute or significant osseous findings. IMPRESSION: There is a redemonstrated multiloculated elongated air and fluid collection which spans in multiple components from the spleen, along the course of the descending colon and anterior to the sigmoid. There are multiple components which to communicate with the colon, including the mid sigmoid (series 2, image 62), and the mid descending colon (series 2, image 45). The dominant components are about the spleen and anterior to the mid sigmoid  colon, measuring approximately 8.2 x 2.4 cm and 5.8 x 3.2 cm respectively. Appearance and configuration of these fluid collections is not simply changed compared to prior CT dated 09/11/2018. There are small fluid loculations in the subcutaneous and abdominal wall tissues overlying the lower collection, presumably along a prior drainage tract (series 2, image 58). Overall findings are consistent with persistent intra-abdominal abscess. Electronically Signed   By: Eddie Candle M.D.   On: 10/22/2018 15:08    Anti-infectives: Anti-infectives (From admission, onward)   Start     Dose/Rate Route Frequency Ordered Stop   10/22/18 1600  piperacillin-tazobactam (ZOSYN) IVPB 3.375 g     3.375 g 12.5 mL/hr over 240 Minutes Intravenous Every 8 hours 10/22/18 1538        Assessment/Plan: s/p * No surgery found * continue IV Zosyn  Plan for perc drain today in IR for diverticular abscess   LOS: 1 day    Autumn Messing III 10/23/2018

## 2018-10-24 NOTE — Progress Notes (Signed)
Referring Physician(s): Dr Evlyn Courier  Supervising Physician: Marybelle Killings  Patient Status:  [facility] - In-pt  Chief Complaint:  Recurrent diverticular abscesses: CT guided drain placement in left upper quadrant abscess and left lower quadrant abscess   Subjective:  Up in bed Hungry Feels some better Drains intact No complaints  Allergies: Patient has no known allergies.  Medications: Prior to Admission medications   Medication Sig Start Date End Date Taking? Authorizing Provider  acetaminophen (TYLENOL) 500 MG tablet Take 1,000 mg by mouth every 6 (six) hours as needed for fever or headache (pain).    Yes [provider]  ketorolac (TORADOL) 10 MG tablet Take 1 tablet (10 mg total) by mouth every 6 (six) hours as needed. Patient taking differently: Take 10 mg by mouth every 6 (six) hours as needed for moderate pain or severe pain.  09/11/18  Yes Nat Christen, MD  naproxen sodium (ALEVE) 220 MG tablet Take 220 mg by mouth as needed (pain).   Yes [provider]  oxyCODONE-acetaminophen (PERCOCET) 5-325 MG tablet Take 1 tablet by mouth every 4 (four) hours as needed. Patient taking differently: Take 1 tablet by mouth every 4 (four) hours as needed for moderate pain or severe pain.  09/11/18  Yes Nat Christen, MD  amoxicillin-clavulanate (AUGMENTIN) 875-125 MG tablet Take 1 tablet by mouth 2 (two) times daily.  10/22/18   [provider]  ondansetron (ZOFRAN) 4 MG tablet Take 1 tablet (4 mg total) by mouth every 6 (six) hours. Patient not taking: Reported on 10/22/2018 09/11/18   Nat Christen, MD  PEG-KCl-NaCl-NaSulf-Na Asc-C (PLENVU) 140 g SOLR Take 140 g by mouth as directed. 09/08/18   Doran Stabler, MD  tamsulosin (FLOMAX) 0.4 MG CAPS capsule Take 1 capsule (0.4 mg total) by mouth daily. Patient not taking: Reported on 09/30/2018 09/11/18   Nat Christen, MD     Vital Signs: BP 119/78 (BP Location: Left Arm)   Pulse 62   Temp 98.4 F (36.9 C) (Oral)    Resp 16   Ht 5\' 7"  (1.702 m)   Wt 155 lb 14.4 oz (70.7 kg)   SpO2 99%   BMI 24.42 kg/m   Physical Exam Skin:    General: Skin is warm and dry.     Comments: Sites of drains are clean and dry NT No bleeding  OP for both purulent; pus like 100 cc and 60 cc yesterday ~50 cc in both today MODERATE WBC PRESENT, PREDOMINANTLY PMN  MODERATE GRAM POSITIVE COCCI IN PAIRS  RARE GRAM VARIABLE ROD      Imaging: Ct Abdomen Pelvis W Contrast  Result Date: 10/22/2018 CLINICAL DATA:  History of diverticulitis and drain placement, recurrent pain EXAM: CT ABDOMEN AND PELVIS WITH CONTRAST TECHNIQUE: Multidetector CT imaging of the abdomen and pelvis was performed using the standard protocol following bolus administration of intravenous contrast. CONTRAST:  145mL ISOVUE-300 IOPAMIDOL (ISOVUE-300) INJECTION 61% COMPARISON:  09/30/2018, 09/11/2018 FINDINGS: Lower chest: Small left pleural effusion. Hepatobiliary: No focal liver abnormality is seen. No gallstones, gallbladder wall thickening, or biliary dilatation. Pancreas: Unremarkable. No pancreatic ductal dilatation or surrounding inflammatory changes. Spleen: Normal in size without focal abnormality. Adrenals/Urinary Tract: Adrenal glands are unremarkable. Kidneys are normal, without renal calculi, focal lesion, or hydronephrosis. Bladder is unremarkable. Stomach/Bowel: Stomach is within normal limits. Appendix appears normal. No evidence of bowel wall thickening, distention, or inflammatory changes. Sigmoid diverticulosis. Vascular/Lymphatic: Calcific atherosclerosis. No enlarged abdominal or pelvic lymph nodes. Reproductive: No mass or other  abnormality. Other: No abdominal wall hernia or abnormality. There is a redemonstrated multiloculated elongated air and fluid collection which spans in multiple components from the spleen, along the course of the descending colon and anterior to the sigmoid. There are multiple components which to communicate with  the colon, including the mid sigmoid (series 2, image 62), and the mid descending colon (series 2, image 45). The dominant components are about the spleen and anterior to the mid sigmoid colon, measuring approximately 8.2 x 2.4 cm and 5.8 x 3.2 cm respectively. Appearance and configuration of these fluid collections is not simply changed compared to prior CT dated 09/11/2018. There are small fluid loculation in the subcutaneous and abdominal wall tissues overlying the lower collection, presumably along a prior drainage tract (series 2, image 58). Musculoskeletal: No acute or significant osseous findings. IMPRESSION: There is a redemonstrated multiloculated elongated air and fluid collection which spans in multiple components from the spleen, along the course of the descending colon and anterior to the sigmoid. There are multiple components which to communicate with the colon, including the mid sigmoid (series 2, image 62), and the mid descending colon (series 2, image 45). The dominant components are about the spleen and anterior to the mid sigmoid colon, measuring approximately 8.2 x 2.4 cm and 5.8 x 3.2 cm respectively. Appearance and configuration of these fluid collections is not simply changed compared to prior CT dated 09/11/2018. There are small fluid loculations in the subcutaneous and abdominal wall tissues overlying the lower collection, presumably along a prior drainage tract (series 2, image 58). Overall findings are consistent with persistent intra-abdominal abscess. Electronically Signed   By: Eddie Candle M.D.   On: 10/22/2018 15:08   Ct Image Guided Drainage By Percutaneous Catheter  Result Date: 10/23/2018 INDICATION: 48 year old with recurrent colonic diverticular abscesses. Recent CT demonstrated abscesses in the left lower quadrant and left upper quadrant. Plan for CT-guided drain placements. EXAM: CT GUIDED DRAINAGE OF LEFT UPPER ABDOMINAL ABSCESS CT-GUIDED DRAINAGE OF LEFT LOWER ABDOMINAL  ABSCESS MEDICATIONS: The patient is currently admitted to the hospital and receiving intravenous antibiotics. ANESTHESIA/SEDATION: 3.0 mg IV Versed 150 mcg IV Fentanyl Moderate Sedation Time:  52 minutes The patient was continuously monitored during the procedure by the interventional radiology nurse under my direct supervision. COMPLICATIONS: None immediate. TECHNIQUE: Informed written consent was obtained from the patient after a thorough discussion of the procedural risks, benefits and alternatives. All questions were addressed. Maximal Sterile Barrier Technique was utilized including caps, mask, sterile gowns, sterile gloves, sterile drape, hand hygiene and skin antiseptic. A timeout was performed prior to the initiation of the procedure. PROCEDURE: Patient was placed supine on the CT scanner. Images through the abdomen were obtained. Left side of the abdomen was shaved. Left upper quadrant abscess was initially targeted. The left lateral abdomen was prepped with chlorhexidine and sterile field was created. Skin and soft tissues anesthetized with 1% lidocaine. 10 gauge trocar needle directed into the left upper quadrant abscess with CT guidance. Yellow purulent fluid was aspirated. Stiff Amplatz wire was advanced into the collection and the tract was dilated to accommodate a 10.2 [place] multipurpose drain. Approximately 30 mL of bloody purulent fluid was aspirated from the left upper quadrant abscess. Follow up CT images were obtained. Catheter was sutured to skin and attached to a suction bulb. Attention was directed to the left lower quadrant abscess. Additional images of the left lower quadrant were obtained. Left lower quadrant was prepped with chlorhexidine and sterile field was created.  Skin was anesthetized medial to the small cutaneous wound. 71 gauge trocar needle was directed into the left lower quadrant abscess with CT guidance. Yellow purulent fluid was aspirated. Stiff Amplatz wire was advanced into  the collection. Tract was dilated to accommodate a 10.2 [place] multipurpose drain. Approximately 40 mL yellow purulent fluid was removed from the left lower quadrant abscess. Follow up CT images were obtained. Catheter was sutured to skin and attached to a suction bulb. FINDINGS: 30 mL of bloody purulent fluid removed from the left upper quadrant abscess. 40 mL yellow purulent fluid was removed from the left lower quadrant abscess. Left lower quadrant drain is just medial to the cutaneous wound and small subcutaneous collection. IMPRESSION: CT-guided placement of a drainage catheter in the left upper quadrant abscess. CT-guided placement of a drainage catheter in the left lower quadrant abscess. Specimens from both abscess collections were sent for culture. Electronically Signed   By: Markus Daft M.D.   On: 10/23/2018 11:47   Ct Image Guided Drainage By Percutaneous Catheter  Result Date: 10/23/2018 INDICATION: 48 year old with recurrent colonic diverticular abscesses. Recent CT demonstrated abscesses in the left lower quadrant and left upper quadrant. Plan for CT-guided drain placements. EXAM: CT GUIDED DRAINAGE OF LEFT UPPER ABDOMINAL ABSCESS CT-GUIDED DRAINAGE OF LEFT LOWER ABDOMINAL ABSCESS MEDICATIONS: The patient is currently admitted to the hospital and receiving intravenous antibiotics. ANESTHESIA/SEDATION: 3.0 mg IV Versed 150 mcg IV Fentanyl Moderate Sedation Time:  52 minutes The patient was continuously monitored during the procedure by the interventional radiology nurse under my direct supervision. COMPLICATIONS: None immediate. TECHNIQUE: Informed written consent was obtained from the patient after a thorough discussion of the procedural risks, benefits and alternatives. All questions were addressed. Maximal Sterile Barrier Technique was utilized including caps, mask, sterile gowns, sterile gloves, sterile drape, hand hygiene and skin antiseptic. A timeout was performed prior to the initiation of  the procedure. PROCEDURE: Patient was placed supine on the CT scanner. Images through the abdomen were obtained. Left side of the abdomen was shaved. Left upper quadrant abscess was initially targeted. The left lateral abdomen was prepped with chlorhexidine and sterile field was created. Skin and soft tissues anesthetized with 1% lidocaine. 80 gauge trocar needle directed into the left upper quadrant abscess with CT guidance. Yellow purulent fluid was aspirated. Stiff Amplatz wire was advanced into the collection and the tract was dilated to accommodate a 10.2 [place] multipurpose drain. Approximately 30 mL of bloody purulent fluid was aspirated from the left upper quadrant abscess. Follow up CT images were obtained. Catheter was sutured to skin and attached to a suction bulb. Attention was directed to the left lower quadrant abscess. Additional images of the left lower quadrant were obtained. Left lower quadrant was prepped with chlorhexidine and sterile field was created. Skin was anesthetized medial to the small cutaneous wound. 48 gauge trocar needle was directed into the left lower quadrant abscess with CT guidance. Yellow purulent fluid was aspirated. Stiff Amplatz wire was advanced into the collection. Tract was dilated to accommodate a 10.2 [place] multipurpose drain. Approximately 40 mL yellow purulent fluid was removed from the left lower quadrant abscess. Follow up CT images were obtained. Catheter was sutured to skin and attached to a suction bulb. FINDINGS: 30 mL of bloody purulent fluid removed from the left upper quadrant abscess. 40 mL yellow purulent fluid was removed from the left lower quadrant abscess. Left lower quadrant drain is just medial to the cutaneous wound and small subcutaneous collection. IMPRESSION: CT-guided  placement of a drainage catheter in the left upper quadrant abscess. CT-guided placement of a drainage catheter in the left lower quadrant abscess. Specimens from both abscess  collections were sent for culture. Electronically Signed   By: Markus Daft M.D.   On: 10/23/2018 11:47    Labs:  CBC: Recent Labs    09/11/18 1624 09/30/18 1141 10/22/18 1556 10/23/18 0330  WBC 14.7* 10.7* 12.7* 8.3  HGB 13.3 13.6 12.6* 12.5*  HCT 41.2 42.9 40.9 40.7  PLT 437* 405* 420* 362    COAGS: Recent Labs    07/15/18 0227 08/13/18 0944 09/30/18 1141  INR 0.99 0.96 0.98    BMP: Recent Labs    09/11/18 1624 09/30/18 1141 10/22/18 1556 10/23/18 0330  NA 135 136 134* 133*  K 3.6 4.3 3.7 3.8  CL 99 103 99 99  CO2 25 24 27 26   GLUCOSE 107* 101* 91 89  BUN 5* <5* 6 6  CALCIUM 8.7* 8.6* 8.4* 8.5*  CREATININE 0.99 0.76 0.74 0.82  GFRNONAA >60 >60 >60 >60  GFRAA >60 >60 >60 >60    LIVER FUNCTION TESTS: Recent Labs    07/10/18 1122 09/30/18 1141 10/22/18 1556  BILITOT 0.7 0.8 0.8  AST 20 17 12*  ALT 20 9 14   ALKPHOS 50 57 60  PROT 6.5 6.7 6.4*  ALBUMIN 3.3* 2.9* 3.3*    Assessment and Plan:  Recurrent diverticulitis 2 new Left abd drains placed Draining well Will follow    Electronically Signed: Lavonia Drafts, PA-C 10/24/2018, 1:12 PM   I spent a total of 15 Minutes at the the patient's bedside AND on the patient's hospital floor or unit, greater than 50% of which was counseling/coordinating care for LLQ absc drains

## 2018-10-24 NOTE — Progress Notes (Signed)
Dressing to LLQ changed per order. Pt's wife observed and verbalized understanding of how to cleanse, pack, and secure dressing to small, open wound. Abscess drain dressings x 2 also changed. Wife verbalized understanding of how to care for drains including emptying and recharging bulbs. Pt has had similar drains before.

## 2018-10-24 NOTE — Progress Notes (Signed)
Subjective/Chief Complaint: Feels well No pain Tolerating po   Objective: Vital signs in last 24 hours: Temp:  [97.8 F (36.6 C)-99 F (37.2 C)] 98.4 F (36.9 C) (02/23 0553) Pulse Rate:  [62-77] 62 (02/23 0553) Resp:  [16-20] 16 (02/23 0553) BP: (99-129)/(17-86) 119/78 (02/23 0553) SpO2:  [96 %-100 %] 99 % (02/23 0553) Last BM Date: 10/22/18  Intake/Output from previous day: 02/22 0701 - 02/23 0700 In: 843.2 [P.O.:370; I.V.:301.6; IV Piggyback:141.6] Out: 1575 [Urine:1425; Drains:150] Intake/Output this shift: No intake/output data recorded.  Exam: Awake and alert Looks comfortable Abdomen soft, almost non-tender, drains with purulence  Lab Results:  Recent Labs    10/22/18 1556 10/23/18 0330  WBC 12.7* 8.3  HGB 12.6* 12.5*  HCT 40.9 40.7  PLT 420* 362   BMET Recent Labs    10/22/18 1556 10/23/18 0330  NA 134* 133*  K 3.7 3.8  CL 99 99  CO2 27 26  GLUCOSE 91 89  BUN 6 6  CREATININE 0.74 0.82  CALCIUM 8.4* 8.5*   PT/INR No results for input(s): LABPROT, INR in the last 72 hours. ABG No results for input(s): PHART, HCO3 in the last 72 hours.  Invalid input(s): PCO2, PO2  Studies/Results: Ct Abdomen Pelvis W Contrast  Result Date: 10/22/2018 CLINICAL DATA:  History of diverticulitis and drain placement, recurrent pain EXAM: CT ABDOMEN AND PELVIS WITH CONTRAST TECHNIQUE: Multidetector CT imaging of the abdomen and pelvis was performed using the standard protocol following bolus administration of intravenous contrast. CONTRAST:  169mL ISOVUE-300 IOPAMIDOL (ISOVUE-300) INJECTION 61% COMPARISON:  09/30/2018, 09/11/2018 FINDINGS: Lower chest: Small left pleural effusion. Hepatobiliary: No focal liver abnormality is seen. No gallstones, gallbladder wall thickening, or biliary dilatation. Pancreas: Unremarkable. No pancreatic ductal dilatation or surrounding inflammatory changes. Spleen: Normal in size without focal abnormality. Adrenals/Urinary Tract:  Adrenal glands are unremarkable. Kidneys are normal, without renal calculi, focal lesion, or hydronephrosis. Bladder is unremarkable. Stomach/Bowel: Stomach is within normal limits. Appendix appears normal. No evidence of bowel wall thickening, distention, or inflammatory changes. Sigmoid diverticulosis. Vascular/Lymphatic: Calcific atherosclerosis. No enlarged abdominal or pelvic lymph nodes. Reproductive: No mass or other abnormality. Other: No abdominal wall hernia or abnormality. There is a redemonstrated multiloculated elongated air and fluid collection which spans in multiple components from the spleen, along the course of the descending colon and anterior to the sigmoid. There are multiple components which to communicate with the colon, including the mid sigmoid (series 2, image 62), and the mid descending colon (series 2, image 45). The dominant components are about the spleen and anterior to the mid sigmoid colon, measuring approximately 8.2 x 2.4 cm and 5.8 x 3.2 cm respectively. Appearance and configuration of these fluid collections is not simply changed compared to prior CT dated 09/11/2018. There are small fluid loculation in the subcutaneous and abdominal wall tissues overlying the lower collection, presumably along a prior drainage tract (series 2, image 58). Musculoskeletal: No acute or significant osseous findings. IMPRESSION: There is a redemonstrated multiloculated elongated air and fluid collection which spans in multiple components from the spleen, along the course of the descending colon and anterior to the sigmoid. There are multiple components which to communicate with the colon, including the mid sigmoid (series 2, image 62), and the mid descending colon (series 2, image 45). The dominant components are about the spleen and anterior to the mid sigmoid colon, measuring approximately 8.2 x 2.4 cm and 5.8 x 3.2 cm respectively. Appearance and configuration of these fluid collections is not  simply changed compared to prior CT dated 09/11/2018. There are small fluid loculations in the subcutaneous and abdominal wall tissues overlying the lower collection, presumably along a prior drainage tract (series 2, image 58). Overall findings are consistent with persistent intra-abdominal abscess. Electronically Signed   By: Eddie Candle M.D.   On: 10/22/2018 15:08   Ct Image Guided Drainage By Percutaneous Catheter  Result Date: 10/23/2018 INDICATION: 48 year old with recurrent colonic diverticular abscesses. Recent CT demonstrated abscesses in the left lower quadrant and left upper quadrant. Plan for CT-guided drain placements. EXAM: CT GUIDED DRAINAGE OF LEFT UPPER ABDOMINAL ABSCESS CT-GUIDED DRAINAGE OF LEFT LOWER ABDOMINAL ABSCESS MEDICATIONS: The patient is currently admitted to the hospital and receiving intravenous antibiotics. ANESTHESIA/SEDATION: 3.0 mg IV Versed 150 mcg IV Fentanyl Moderate Sedation Time:  52 minutes The patient was continuously monitored during the procedure by the interventional radiology nurse under my direct supervision. COMPLICATIONS: None immediate. TECHNIQUE: Informed written consent was obtained from the patient after a thorough discussion of the procedural risks, benefits and alternatives. All questions were addressed. Maximal Sterile Barrier Technique was utilized including caps, mask, sterile gowns, sterile gloves, sterile drape, hand hygiene and skin antiseptic. A timeout was performed prior to the initiation of the procedure. PROCEDURE: Patient was placed supine on the CT scanner. Images through the abdomen were obtained. Left side of the abdomen was shaved. Left upper quadrant abscess was initially targeted. The left lateral abdomen was prepped with chlorhexidine and sterile field was created. Skin and soft tissues anesthetized with 1% lidocaine. 65 gauge trocar needle directed into the left upper quadrant abscess with CT guidance. Yellow purulent fluid was aspirated.  Stiff Amplatz wire was advanced into the collection and the tract was dilated to accommodate a 10.2 [place] multipurpose drain. Approximately 30 mL of bloody purulent fluid was aspirated from the left upper quadrant abscess. Follow up CT images were obtained. Catheter was sutured to skin and attached to a suction bulb. Attention was directed to the left lower quadrant abscess. Additional images of the left lower quadrant were obtained. Left lower quadrant was prepped with chlorhexidine and sterile field was created. Skin was anesthetized medial to the small cutaneous wound. 74 gauge trocar needle was directed into the left lower quadrant abscess with CT guidance. Yellow purulent fluid was aspirated. Stiff Amplatz wire was advanced into the collection. Tract was dilated to accommodate a 10.2 [place] multipurpose drain. Approximately 40 mL yellow purulent fluid was removed from the left lower quadrant abscess. Follow up CT images were obtained. Catheter was sutured to skin and attached to a suction bulb. FINDINGS: 30 mL of bloody purulent fluid removed from the left upper quadrant abscess. 40 mL yellow purulent fluid was removed from the left lower quadrant abscess. Left lower quadrant drain is just medial to the cutaneous wound and small subcutaneous collection. IMPRESSION: CT-guided placement of a drainage catheter in the left upper quadrant abscess. CT-guided placement of a drainage catheter in the left lower quadrant abscess. Specimens from both abscess collections were sent for culture. Electronically Signed   By: Markus Daft M.D.   On: 10/23/2018 11:47   Ct Image Guided Drainage By Percutaneous Catheter  Result Date: 10/23/2018 INDICATION: 48 year old with recurrent colonic diverticular abscesses. Recent CT demonstrated abscesses in the left lower quadrant and left upper quadrant. Plan for CT-guided drain placements. EXAM: CT GUIDED DRAINAGE OF LEFT UPPER ABDOMINAL ABSCESS CT-GUIDED DRAINAGE OF LEFT LOWER  ABDOMINAL ABSCESS MEDICATIONS: The patient is currently admitted to the hospital and receiving intravenous  antibiotics. ANESTHESIA/SEDATION: 3.0 mg IV Versed 150 mcg IV Fentanyl Moderate Sedation Time:  52 minutes The patient was continuously monitored during the procedure by the interventional radiology nurse under my direct supervision. COMPLICATIONS: None immediate. TECHNIQUE: Informed written consent was obtained from the patient after a thorough discussion of the procedural risks, benefits and alternatives. All questions were addressed. Maximal Sterile Barrier Technique was utilized including caps, mask, sterile gowns, sterile gloves, sterile drape, hand hygiene and skin antiseptic. A timeout was performed prior to the initiation of the procedure. PROCEDURE: Patient was placed supine on the CT scanner. Images through the abdomen were obtained. Left side of the abdomen was shaved. Left upper quadrant abscess was initially targeted. The left lateral abdomen was prepped with chlorhexidine and sterile field was created. Skin and soft tissues anesthetized with 1% lidocaine. 23 gauge trocar needle directed into the left upper quadrant abscess with CT guidance. Yellow purulent fluid was aspirated. Stiff Amplatz wire was advanced into the collection and the tract was dilated to accommodate a 10.2 [place] multipurpose drain. Approximately 30 mL of bloody purulent fluid was aspirated from the left upper quadrant abscess. Follow up CT images were obtained. Catheter was sutured to skin and attached to a suction bulb. Attention was directed to the left lower quadrant abscess. Additional images of the left lower quadrant were obtained. Left lower quadrant was prepped with chlorhexidine and sterile field was created. Skin was anesthetized medial to the small cutaneous wound. 73 gauge trocar needle was directed into the left lower quadrant abscess with CT guidance. Yellow purulent fluid was aspirated. Stiff Amplatz wire was  advanced into the collection. Tract was dilated to accommodate a 10.2 [place] multipurpose drain. Approximately 40 mL yellow purulent fluid was removed from the left lower quadrant abscess. Follow up CT images were obtained. Catheter was sutured to skin and attached to a suction bulb. FINDINGS: 30 mL of bloody purulent fluid removed from the left upper quadrant abscess. 40 mL yellow purulent fluid was removed from the left lower quadrant abscess. Left lower quadrant drain is just medial to the cutaneous wound and small subcutaneous collection. IMPRESSION: CT-guided placement of a drainage catheter in the left upper quadrant abscess. CT-guided placement of a drainage catheter in the left lower quadrant abscess. Specimens from both abscess collections were sent for culture. Electronically Signed   By: Markus Daft M.D.   On: 10/23/2018 11:47    Anti-infectives: Anti-infectives (From admission, onward)   Start     Dose/Rate Route Frequency Ordered Stop   10/22/18 1600  piperacillin-tazobactam (ZOSYN) IVPB 3.375 g     3.375 g 12.5 mL/hr over 240 Minutes Intravenous Every 8 hours 10/22/18 1538        Assessment/Plan: Diverticular abscesses, recurrent  Continue antibiotics and drains Advance diet May need surgery and potentially colostomy in detail.  I again discussed that with he and his mother who was in the room.  LOS: 2 days    Coralie Keens 10/24/2018

## 2018-10-25 LAB — CBC
HCT: 41.6 % (ref 39.0–52.0)
Hemoglobin: 13.3 g/dL (ref 13.0–17.0)
MCH: 28.9 pg (ref 26.0–34.0)
MCHC: 32 g/dL (ref 30.0–36.0)
MCV: 90.4 fL (ref 80.0–100.0)
Platelets: 340 10*3/uL (ref 150–400)
RBC: 4.6 MIL/uL (ref 4.22–5.81)
RDW: 15.1 % (ref 11.5–15.5)
WBC: 6.1 10*3/uL (ref 4.0–10.5)
nRBC: 0 % (ref 0.0–0.2)

## 2018-10-25 MED ORDER — LEVOFLOXACIN 250 MG PO TABS
250.0000 mg | ORAL_TABLET | Freq: Every day | ORAL | Status: DC
  Administered 2018-10-25: 250 mg via ORAL
  Filled 2018-10-25: qty 1

## 2018-10-25 MED ORDER — LEVOFLOXACIN 750 MG PO TABS
750.0000 mg | ORAL_TABLET | Freq: Every day | ORAL | Status: DC
  Administered 2018-10-26: 750 mg via ORAL
  Filled 2018-10-25: qty 1

## 2018-10-25 NOTE — Progress Notes (Signed)
Referring Physician(s): Blackman,D  Supervising Physician: Corrie Mckusick  Patient Status:  [facility] - In-pt  Chief Complaint:  Abdominal pain/abscesses  Subjective: Pt doing ok; feels better since abd drains placed; denies N/V   Allergies: Patient has no known allergies.  Medications: Prior to Admission medications   Medication Sig Start Date End Date Taking? Authorizing Provider  acetaminophen (TYLENOL) 500 MG tablet Take 1,000 mg by mouth every 6 (six) hours as needed for fever or headache (pain).    Yes [provider]  ketorolac (TORADOL) 10 MG tablet Take 1 tablet (10 mg total) by mouth every 6 (six) hours as needed. Patient taking differently: Take 10 mg by mouth every 6 (six) hours as needed for moderate pain or severe pain.  09/11/18  Yes Nat Christen, MD  naproxen sodium (ALEVE) 220 MG tablet Take 220 mg by mouth as needed (pain).   Yes [provider]  oxyCODONE-acetaminophen (PERCOCET) 5-325 MG tablet Take 1 tablet by mouth every 4 (four) hours as needed. Patient taking differently: Take 1 tablet by mouth every 4 (four) hours as needed for moderate pain or severe pain.  09/11/18  Yes Nat Christen, MD  amoxicillin-clavulanate (AUGMENTIN) 875-125 MG tablet Take 1 tablet by mouth 2 (two) times daily.  10/22/18   [provider]  ondansetron (ZOFRAN) 4 MG tablet Take 1 tablet (4 mg total) by mouth every 6 (six) hours. Patient not taking: Reported on 10/22/2018 09/11/18   Nat Christen, MD  PEG-KCl-NaCl-NaSulf-Na Asc-C (PLENVU) 140 g SOLR Take 140 g by mouth as directed. 09/08/18   Doran Stabler, MD  tamsulosin (FLOMAX) 0.4 MG CAPS capsule Take 1 capsule (0.4 mg total) by mouth daily. Patient not taking: Reported on 09/30/2018 09/11/18   Nat Christen, MD     Vital Signs: BP 110/69 (BP Location: Right Arm)   Pulse 94   Temp 98.4 F (36.9 C) (Oral)   Resp 17   Ht 5\' 7"  (1.702 m)   Wt 155 lb 14.4 oz (70.7 kg)   SpO2 99%   BMI 24.42 kg/m   Physical  Exam left upper/lower abd drains intact, insertion sites ok, not sig tender, outputs 10/15 cc purulent fluid  Imaging: Ct Abdomen Pelvis W Contrast  Result Date: 10/22/2018 CLINICAL DATA:  History of diverticulitis and drain placement, recurrent pain EXAM: CT ABDOMEN AND PELVIS WITH CONTRAST TECHNIQUE: Multidetector CT imaging of the abdomen and pelvis was performed using the standard protocol following bolus administration of intravenous contrast. CONTRAST:  167mL ISOVUE-300 IOPAMIDOL (ISOVUE-300) INJECTION 61% COMPARISON:  09/30/2018, 09/11/2018 FINDINGS: Lower chest: Small left pleural effusion. Hepatobiliary: No focal liver abnormality is seen. No gallstones, gallbladder wall thickening, or biliary dilatation. Pancreas: Unremarkable. No pancreatic ductal dilatation or surrounding inflammatory changes. Spleen: Normal in size without focal abnormality. Adrenals/Urinary Tract: Adrenal glands are unremarkable. Kidneys are normal, without renal calculi, focal lesion, or hydronephrosis. Bladder is unremarkable. Stomach/Bowel: Stomach is within normal limits. Appendix appears normal. No evidence of bowel wall thickening, distention, or inflammatory changes. Sigmoid diverticulosis. Vascular/Lymphatic: Calcific atherosclerosis. No enlarged abdominal or pelvic lymph nodes. Reproductive: No mass or other abnormality. Other: No abdominal wall hernia or abnormality. There is a redemonstrated multiloculated elongated air and fluid collection which spans in multiple components from the spleen, along the course of the descending colon and anterior to the sigmoid. There are multiple components which to communicate with the colon, including the mid sigmoid (series 2, image 62), and the mid descending colon (series 2, image 45). The  dominant components are about the spleen and anterior to the mid sigmoid colon, measuring approximately 8.2 x 2.4 cm and 5.8 x 3.2 cm respectively. Appearance and configuration of these fluid  collections is not simply changed compared to prior CT dated 09/11/2018. There are small fluid loculation in the subcutaneous and abdominal wall tissues overlying the lower collection, presumably along a prior drainage tract (series 2, image 58). Musculoskeletal: No acute or significant osseous findings. IMPRESSION: There is a redemonstrated multiloculated elongated air and fluid collection which spans in multiple components from the spleen, along the course of the descending colon and anterior to the sigmoid. There are multiple components which to communicate with the colon, including the mid sigmoid (series 2, image 62), and the mid descending colon (series 2, image 45). The dominant components are about the spleen and anterior to the mid sigmoid colon, measuring approximately 8.2 x 2.4 cm and 5.8 x 3.2 cm respectively. Appearance and configuration of these fluid collections is not simply changed compared to prior CT dated 09/11/2018. There are small fluid loculations in the subcutaneous and abdominal wall tissues overlying the lower collection, presumably along a prior drainage tract (series 2, image 58). Overall findings are consistent with persistent intra-abdominal abscess. Electronically Signed   By: Eddie Candle M.D.   On: 10/22/2018 15:08   Ct Image Guided Drainage By Percutaneous Catheter  Result Date: 10/23/2018 INDICATION: 48 year old with recurrent colonic diverticular abscesses. Recent CT demonstrated abscesses in the left lower quadrant and left upper quadrant. Plan for CT-guided drain placements. EXAM: CT GUIDED DRAINAGE OF LEFT UPPER ABDOMINAL ABSCESS CT-GUIDED DRAINAGE OF LEFT LOWER ABDOMINAL ABSCESS MEDICATIONS: The patient is currently admitted to the hospital and receiving intravenous antibiotics. ANESTHESIA/SEDATION: 3.0 mg IV Versed 150 mcg IV Fentanyl Moderate Sedation Time:  52 minutes The patient was continuously monitored during the procedure by the interventional radiology nurse under  my direct supervision. COMPLICATIONS: None immediate. TECHNIQUE: Informed written consent was obtained from the patient after a thorough discussion of the procedural risks, benefits and alternatives. All questions were addressed. Maximal Sterile Barrier Technique was utilized including caps, mask, sterile gowns, sterile gloves, sterile drape, hand hygiene and skin antiseptic. A timeout was performed prior to the initiation of the procedure. PROCEDURE: Patient was placed supine on the CT scanner. Images through the abdomen were obtained. Left side of the abdomen was shaved. Left upper quadrant abscess was initially targeted. The left lateral abdomen was prepped with chlorhexidine and sterile field was created. Skin and soft tissues anesthetized with 1% lidocaine. 36 gauge trocar needle directed into the left upper quadrant abscess with CT guidance. Yellow purulent fluid was aspirated. Stiff Amplatz wire was advanced into the collection and the tract was dilated to accommodate a 10.2 [place] multipurpose drain. Approximately 30 mL of bloody purulent fluid was aspirated from the left upper quadrant abscess. Follow up CT images were obtained. Catheter was sutured to skin and attached to a suction bulb. Attention was directed to the left lower quadrant abscess. Additional images of the left lower quadrant were obtained. Left lower quadrant was prepped with chlorhexidine and sterile field was created. Skin was anesthetized medial to the small cutaneous wound. 67 gauge trocar needle was directed into the left lower quadrant abscess with CT guidance. Yellow purulent fluid was aspirated. Stiff Amplatz wire was advanced into the collection. Tract was dilated to accommodate a 10.2 [place] multipurpose drain. Approximately 40 mL yellow purulent fluid was removed from the left lower quadrant abscess. Follow up CT images were  obtained. Catheter was sutured to skin and attached to a suction bulb. FINDINGS: 30 mL of bloody purulent  fluid removed from the left upper quadrant abscess. 40 mL yellow purulent fluid was removed from the left lower quadrant abscess. Left lower quadrant drain is just medial to the cutaneous wound and small subcutaneous collection. IMPRESSION: CT-guided placement of a drainage catheter in the left upper quadrant abscess. CT-guided placement of a drainage catheter in the left lower quadrant abscess. Specimens from both abscess collections were sent for culture. Electronically Signed   By: Markus Daft M.D.   On: 10/23/2018 11:47   Ct Image Guided Drainage By Percutaneous Catheter  Result Date: 10/23/2018 INDICATION: 48 year old with recurrent colonic diverticular abscesses. Recent CT demonstrated abscesses in the left lower quadrant and left upper quadrant. Plan for CT-guided drain placements. EXAM: CT GUIDED DRAINAGE OF LEFT UPPER ABDOMINAL ABSCESS CT-GUIDED DRAINAGE OF LEFT LOWER ABDOMINAL ABSCESS MEDICATIONS: The patient is currently admitted to the hospital and receiving intravenous antibiotics. ANESTHESIA/SEDATION: 3.0 mg IV Versed 150 mcg IV Fentanyl Moderate Sedation Time:  52 minutes The patient was continuously monitored during the procedure by the interventional radiology nurse under my direct supervision. COMPLICATIONS: None immediate. TECHNIQUE: Informed written consent was obtained from the patient after a thorough discussion of the procedural risks, benefits and alternatives. All questions were addressed. Maximal Sterile Barrier Technique was utilized including caps, mask, sterile gowns, sterile gloves, sterile drape, hand hygiene and skin antiseptic. A timeout was performed prior to the initiation of the procedure. PROCEDURE: Patient was placed supine on the CT scanner. Images through the abdomen were obtained. Left side of the abdomen was shaved. Left upper quadrant abscess was initially targeted. The left lateral abdomen was prepped with chlorhexidine and sterile field was created. Skin and soft  tissues anesthetized with 1% lidocaine. 58 gauge trocar needle directed into the left upper quadrant abscess with CT guidance. Yellow purulent fluid was aspirated. Stiff Amplatz wire was advanced into the collection and the tract was dilated to accommodate a 10.2 [place] multipurpose drain. Approximately 30 mL of bloody purulent fluid was aspirated from the left upper quadrant abscess. Follow up CT images were obtained. Catheter was sutured to skin and attached to a suction bulb. Attention was directed to the left lower quadrant abscess. Additional images of the left lower quadrant were obtained. Left lower quadrant was prepped with chlorhexidine and sterile field was created. Skin was anesthetized medial to the small cutaneous wound. 29 gauge trocar needle was directed into the left lower quadrant abscess with CT guidance. Yellow purulent fluid was aspirated. Stiff Amplatz wire was advanced into the collection. Tract was dilated to accommodate a 10.2 [place] multipurpose drain. Approximately 40 mL yellow purulent fluid was removed from the left lower quadrant abscess. Follow up CT images were obtained. Catheter was sutured to skin and attached to a suction bulb. FINDINGS: 30 mL of bloody purulent fluid removed from the left upper quadrant abscess. 40 mL yellow purulent fluid was removed from the left lower quadrant abscess. Left lower quadrant drain is just medial to the cutaneous wound and small subcutaneous collection. IMPRESSION: CT-guided placement of a drainage catheter in the left upper quadrant abscess. CT-guided placement of a drainage catheter in the left lower quadrant abscess. Specimens from both abscess collections were sent for culture. Electronically Signed   By: Markus Daft M.D.   On: 10/23/2018 11:47    Labs:  CBC: Recent Labs    09/30/18 1141 10/22/18 1556 10/23/18 0330 10/25/18 0414  WBC 10.7* 12.7* 8.3 6.1  HGB 13.6 12.6* 12.5* 13.3  HCT 42.9 40.9 40.7 41.6  PLT 405* 420* 362 340     COAGS: Recent Labs    07/15/18 0227 08/13/18 0944 09/30/18 1141  INR 0.99 0.96 0.98    BMP: Recent Labs    09/11/18 1624 09/30/18 1141 10/22/18 1556 10/23/18 0330  NA 135 136 134* 133*  K 3.6 4.3 3.7 3.8  CL 99 103 99 99  CO2 25 24 27 26   GLUCOSE 107* 101* 91 89  BUN 5* <5* 6 6  CALCIUM 8.7* 8.6* 8.4* 8.5*  CREATININE 0.99 0.76 0.74 0.82  GFRNONAA >60 >60 >60 >60  GFRAA >60 >60 >60 >60    LIVER FUNCTION TESTS: Recent Labs    07/10/18 1122 09/30/18 1141 10/22/18 1556  BILITOT 0.7 0.8 0.8  AST 20 17 12*  ALT 20 9 14   ALKPHOS 50 57 60  PROT 6.5 6.7 6.4*  ALBUMIN 3.3* 2.9* 3.3*    Assessment and Plan: Pt with hx recurrent diverticular abscesses; s/p LUQ/LLQ drain placements 2/22; afebrile; WBC nl; hgb nl; prelim cx- klebsiella; cont drain flushes/output monitoring; check f/u CT within 1 week of drain placement   Electronically Signed: D. Rowe Robert, PA-C 10/25/2018, 3:05 PM   I spent a total of 15 minutes at the the patient's bedside AND on the patient's hospital floor or unit, greater than 50% of which was counseling/coordinating care for abdominal abscess drains    Patient ID: Douglas Weber, male   DOB: 09-24-1970, 48 y.o.   MRN: [id number]

## 2018-10-25 NOTE — Progress Notes (Signed)
Subjective/Chief Complaint: Feels well No pain Tolerating po Passing flatus   Objective: Vital signs in last 24 hours: Temp:  [97.8 F (36.6 C)-98.4 F (36.9 C)] 97.8 F (36.6 C) (02/24 0517) Pulse Rate:  [60-73] 60 (02/24 0517) Resp:  [14-16] 16 (02/24 0517) BP: (100-111)/(64-68) 100/64 (02/24 0517) SpO2:  [98 %-99 %] 99 % (02/24 0517) Last BM Date: 10/22/18  Intake/Output from previous day: 02/23 0701 - 02/24 0700 In: 1433.1 [P.O.:300; I.V.:928.7; IV Piggyback:174.4] Out: 340 [Urine:300; Drains:40] Intake/Output this shift: Total I/O In: -  Out: 100 [Urine:100]  Exam: Awake and alert Looks comfortable Abdomen soft, almost non-tender, drains with purulence  Lab Results:  Recent Labs    10/23/18 0330 10/25/18 0414  WBC 8.3 6.1  HGB 12.5* 13.3  HCT 40.7 41.6  PLT 362 340   BMET Recent Labs    10/22/18 1556 10/23/18 0330  NA 134* 133*  K 3.7 3.8  CL 99 99  CO2 27 26  GLUCOSE 91 89  BUN 6 6  CREATININE 0.74 0.82  CALCIUM 8.4* 8.5*   PT/INR No results for input(s): LABPROT, INR in the last 72 hours. ABG No results for input(s): PHART, HCO3 in the last 72 hours.  Invalid input(s): PCO2, PO2  Studies/Results: Ct Image Guided Drainage By Percutaneous Catheter  Result Date: 10/23/2018 INDICATION: 48 year old with recurrent colonic diverticular abscesses. Recent CT demonstrated abscesses in the left lower quadrant and left upper quadrant. Plan for CT-guided drain placements. EXAM: CT GUIDED DRAINAGE OF LEFT UPPER ABDOMINAL ABSCESS CT-GUIDED DRAINAGE OF LEFT LOWER ABDOMINAL ABSCESS MEDICATIONS: The patient is currently admitted to the hospital and receiving intravenous antibiotics. ANESTHESIA/SEDATION: 3.0 mg IV Versed 150 mcg IV Fentanyl Moderate Sedation Time:  52 minutes The patient was continuously monitored during the procedure by the interventional radiology nurse under my direct supervision. COMPLICATIONS: None immediate. TECHNIQUE: Informed  written consent was obtained from the patient after a thorough discussion of the procedural risks, benefits and alternatives. All questions were addressed. Maximal Sterile Barrier Technique was utilized including caps, mask, sterile gowns, sterile gloves, sterile drape, hand hygiene and skin antiseptic. A timeout was performed prior to the initiation of the procedure. PROCEDURE: Patient was placed supine on the CT scanner. Images through the abdomen were obtained. Left side of the abdomen was shaved. Left upper quadrant abscess was initially targeted. The left lateral abdomen was prepped with chlorhexidine and sterile field was created. Skin and soft tissues anesthetized with 1% lidocaine. 70 gauge trocar needle directed into the left upper quadrant abscess with CT guidance. Yellow purulent fluid was aspirated. Stiff Amplatz wire was advanced into the collection and the tract was dilated to accommodate a 10.2 [place] multipurpose drain. Approximately 30 mL of bloody purulent fluid was aspirated from the left upper quadrant abscess. Follow up CT images were obtained. Catheter was sutured to skin and attached to a suction bulb. Attention was directed to the left lower quadrant abscess. Additional images of the left lower quadrant were obtained. Left lower quadrant was prepped with chlorhexidine and sterile field was created. Skin was anesthetized medial to the small cutaneous wound. 62 gauge trocar needle was directed into the left lower quadrant abscess with CT guidance. Yellow purulent fluid was aspirated. Stiff Amplatz wire was advanced into the collection. Tract was dilated to accommodate a 10.2 [place] multipurpose drain. Approximately 40 mL yellow purulent fluid was removed from the left lower quadrant abscess. Follow up CT images were obtained. Catheter was sutured to skin and attached to a  suction bulb. FINDINGS: 30 mL of bloody purulent fluid removed from the left upper quadrant abscess. 40 mL yellow purulent  fluid was removed from the left lower quadrant abscess. Left lower quadrant drain is just medial to the cutaneous wound and small subcutaneous collection. IMPRESSION: CT-guided placement of a drainage catheter in the left upper quadrant abscess. CT-guided placement of a drainage catheter in the left lower quadrant abscess. Specimens from both abscess collections were sent for culture. Electronically Signed   By: Markus Daft M.D.   On: 10/23/2018 11:47    Anti-infectives: Anti-infectives (From admission, onward)   Start     Dose/Rate Route Frequency Ordered Stop   10/22/18 1600  piperacillin-tazobactam (ZOSYN) IVPB 3.375 g     3.375 g 12.5 mL/hr over 240 Minutes Intravenous Every 8 hours 10/22/18 1538        Assessment/Plan: Diverticular abscesses, recurrent  Continue antibiotics and drains Cont diet Will attempt continued non-operative management Possible d/c tom on daily miralax and PO abx   LOS: 3 days    Rosario Adie 2/47/0370

## 2018-10-26 ENCOUNTER — Other Ambulatory Visit: Payer: Self-pay | Admitting: General Surgery

## 2018-10-26 ENCOUNTER — Other Ambulatory Visit: Payer: Self-pay | Admitting: Physician Assistant

## 2018-10-26 DIAGNOSIS — K5720 Diverticulitis of large intestine with perforation and abscess without bleeding: Secondary | ICD-10-CM

## 2018-10-26 MED ORDER — POLYETHYLENE GLYCOL 3350 17 G PO PACK: 17.0000 g | PACK | Freq: Every day | ORAL | 0 refills | Status: DC

## 2018-10-26 MED ORDER — ACETAMINOPHEN 325 MG PO TABS: 650.0000 mg | ORAL_TABLET | Freq: Four times a day (QID) | ORAL | Status: AC | PRN

## 2018-10-26 MED ORDER — LEVOFLOXACIN 750 MG PO TABS: 750.0000 mg | ORAL_TABLET | Freq: Every day | ORAL | 0 refills | Status: DC

## 2018-10-26 MED ORDER — SODIUM CHLORIDE 0.9% FLUSH: 3.0000 mL | Freq: Two times a day (BID) | INTRAVENOUS | Status: DC

## 2018-10-26 MED ORDER — SODIUM CHLORIDE 0.9% FLUSH: 3.0000 mL | Freq: Two times a day (BID) | INTRAVENOUS | 3 refills | Status: DC

## 2018-10-26 NOTE — Discharge Summary (Addendum)
[facility] Discharge Summary   Patient ID: CLAYSON RILING MRN: [id number] DOB/AGE: 12-12-1970 48 y.o.  Admit date: 10/22/2018 Discharge date: 10/26/2018  Admitting Diagnosis: Recurrent diverticulitis with abscess  Discharge Diagnosis Patient Active Problem List   Diagnosis Date Noted  . Intraabdominal fluid collection 10/22/2018  . Hypokalemia 07/17/2018  . Intra-abdominal abscess s/p perc drainage 07/15/2018 07/17/2018  . Diverticulitis of large intestine with abscess 07/10/2018    Consultants Interventional radiology  Imaging: No results found.  Procedures Dr. Anselm Pancoast (10/23/18) - CT guided drain placement in left upper quadrant abscess and left lower quadrant abscess  Hospital Course:  ONEILL BAIS is a 48yo male with history of perforated diverticulitis with left lower quadrant abscess treated with a drain on 07/15/18 which was removed on 08/12/18, who was admitted to [facility] 2/21 with recurrent diverticulitis. Patient was seen in our office that day and sent for CT scan which showed diverticulitis with multiple fluid collections.  IR was consulted and placed 2 percutaneous drains on 2/22. Tolerated procedure well.  Diet was advanced as tolerated. Culture from drain fluid grew Klebsiella pneumoniae. Antibiotics were switched to levofloxacin 2/24. On 2/25, the patient was voiding well, tolerating diet, having bowel function, pain well controlled, vital signs stable and felt stable for discharge home with drains.  Patient will follow up with IR drain clinic and Dr. [name] as below and knows to call with questions or concerns.     Physical Exam: General:  Alert, NAD, pleasant, comfortable Pulm: effort normal Cardio: RRR Abd:  Soft, ND, mild TTP around drains, +BS, L drain with purulent/pink tinged fluid, R drain with purulent fluid  Allergies as of 10/26/2018   No Known Allergies     Medication List    STOP taking these medications   amoxicillin-clavulanate  875-125 MG tablet Commonly known as:  AUGMENTIN   ketorolac 10 MG tablet Commonly known as:  TORADOL   ondansetron 4 MG tablet Commonly known as:  ZOFRAN   oxyCODONE-acetaminophen 5-325 MG tablet Commonly known as:  PERCOCET   PEG-KCl-NaCl-NaSulf-Na Asc-C 140 g Solr Commonly known as:  PLENVU   tamsulosin 0.4 MG Caps capsule Commonly known as:  FLOMAX     TAKE these medications   acetaminophen 325 MG tablet Commonly known as:  TYLENOL Take 2 tablets (650 mg total) by mouth every 6 (six) hours as needed for mild pain (or temp > 100). What changed:    medication strength  how much to take  reasons to take this   levofloxacin 750 MG tablet Commonly known as:  LEVAQUIN Take 1 tablet (750 mg total) by mouth daily.   naproxen sodium 220 MG tablet Commonly known as:  ALEVE Take 220 mg by mouth as needed (pain).   polyethylene glycol packet Commonly known as:  MIRALAX / GLYCOLAX Take 17 g by mouth daily.   sodium chloride flush 0.9 % Soln Commonly known as:  NS 3 mLs by Intracatheter route every 12 (twelve) hours.        Follow-up Information    Leighton Ruff, MD. Go on 4/80/1655.   Specialty:  General Surgery Why:  Your appointment is 11/16/2018 @ 10:50am. Please arrive 15 minutes prior to your appointment to check in and fill out paperwork. Contact information: [address] [place] [address] [phone]        Markus Daft, MD Follow up.   Specialties:  Interventional Radiology, Radiology Contact information: [address] [place] [address] [phone]  Signed: [place], [facility] 10/26/2018, 9:08 AM Pager: [phone] Mon-Thurs 7:00 am-4:30 pm Fri 7:00 am -11:30 AM Sat-Sun 7:00 am-11:30 am

## 2018-10-26 NOTE — Care Management (Signed)
Pt dc'd home prior to this CM completing setting up his home health care. Pt had been offered choice for home health services and [facility] was chosen. [facility] rep contacted for referral. Marney Doctor RN,BSN [phone]

## 2018-10-26 NOTE — Progress Notes (Signed)
Discharge and medication instructions reviewed with patient and mother. Questions answered and both deny further questions. One prescription for flushes given to patient. Parents are driving patient home. Donne Hazel, RN

## 2018-10-26 NOTE — Discharge Instructions (Addendum)
Flush both drains with 91mL normal saline two times daily Record output Take antibiotics as prescribed Follow up with Dr. [name] and the drain clinic  Abdominal wound: Continue to loosely back left lower quadrant abdominal wound with packing strip. You should use a little less each time. Apply dry gauze over top. Change once daily. Shower with wound open. Stop packing once wound becomes too small.   Percutaneous Abscess Drain, Care After This sheet gives you information about how to care for yourself after your procedure. Your health care provider may also give you more specific instructions. If you have problems or questions, contact your health care provider. What can I expect after the procedure? After your procedure, it is common to have:  A small amount of bruising and discomfort in the area where the drainage tube (catheter) was placed.  Sleepiness and fatigue. This should go away after the medicines you were given have worn off. Follow these instructions at home: Incision care  Follow instructions from your health care provider about how to take care of your incision. Make sure you: ? Wash your hands with soap and water before you change your bandage (dressing). If soap and water are not available, use hand sanitizer. ? Change your dressing as told by your health care provider. ? Leave stitches (sutures), skin glue, or adhesive strips in place. These skin closures may need to stay in place for 2 weeks or longer. If adhesive strip edges start to loosen and curl up, you may trim the loose edges. Do not remove adhesive strips completely unless your health care provider tells you to do that.  Check your incision area every day for signs of infection. Check for: ? More redness, swelling, or pain. ? More fluid or blood. ? Warmth. ? Pus or a bad smell. ? Fluid leaking from around your catheter (instead of fluid draining through your catheter). Catheter care   Follow instructions from  your health care provider about emptying and cleaning your catheter and collection bag. You may need to clean the catheter every day so it does not clog.  If directed, write down the following information every time you empty your bag: ? The date and time. ? The amount of drainage. General instructions  Rest at home for 1-2 days after your procedure. Return to your normal activities as told by your health care provider.  Do not take baths, swim, or use a hot tub for 24 hours after your procedure, or until your health care provider says that this is okay.  Take over-the-counter and prescription medicines only as told by your health care provider.  Keep all follow-up visits as told by your health care provider. This is important. Contact a health care provider if:  You have less than 10 mL of drainage a day for 2-3 days in a row, or as directed by your health care provider.  You have more redness, swelling, or pain around your incision area.  You have more fluid or blood coming from your incision area.  Your incision area feels warm to the touch.  You have pus or a bad smell coming from your incision area.  You have fluid leaking from around your catheter (instead of through your catheter).  You have a fever or chills.  You have pain that does not get better with medicine. Get help right away if:  Your catheter comes out.  You suddenly stop having drainage from your catheter.  You suddenly have blood in the fluid  that is draining from your catheter.  You become dizzy or you faint.  You develop a rash.  You have nausea or vomiting.  You have difficulty breathing or you feel short of breath.  You develop chest pain.  You have problems with your speech or vision.  You have trouble balancing or moving your arms or legs. Summary  It is common to have a small amount of bruising and discomfort in the area where the drainage tube (catheter) was placed.  You may be  directed to record the amount of drainage from the bag every time you empty it.  Follow instructions from your health care provider about emptying and cleaning your catheter and collection bag. This information is not intended to replace advice given to you by your health care provider. Make sure you discuss any questions you have with your health care provider. Document Released: 01/02/2014 Document Revised: 07/10/2016 Document Reviewed: 07/10/2016 Elsevier Interactive Patient Education  2019 [employer].

## 2018-10-27 LAB — AEROBIC/ANAEROBIC CULTURE W GRAM STAIN (SURGICAL/DEEP WOUND)

## 2018-10-27 LAB — AEROBIC/ANAEROBIC CULTURE (SURGICAL/DEEP WOUND)

## 2018-11-03 ENCOUNTER — Ambulatory Visit
Admission: RE | Admit: 2018-11-03 | Discharge: 2018-11-03 | Disposition: A | Discharge status: Home / Self Care | Payer: BLUE CROSS/BLUE SHIELD | Source: Ambulatory Visit | Attending: General Surgery | Admitting: General Surgery

## 2018-11-03 ENCOUNTER — Ambulatory Visit
Admission: RE | Admit: 2018-11-03 | Discharge: 2018-11-03 | Disposition: A | Discharge status: Home / Self Care | Payer: BLUE CROSS/BLUE SHIELD | Source: Ambulatory Visit | Attending: Physician Assistant | Admitting: Physician Assistant

## 2018-11-03 ENCOUNTER — Other Ambulatory Visit: Payer: Self-pay | Admitting: General Surgery

## 2018-11-03 DIAGNOSIS — K5720 Diverticulitis of large intestine with perforation and abscess without bleeding: Secondary | ICD-10-CM

## 2018-11-03 HISTORY — PX: IR RADIOLOGIST EVAL & MGMT: IMG5224

## 2018-11-03 MED ORDER — IOPAMIDOL (ISOVUE-300) INJECTION 61%
100.0000 mL | Freq: Once | INTRAVENOUS | Status: AC | PRN
  Administered 2018-11-03: 100 mL via INTRAVENOUS

## 2018-11-03 NOTE — Progress Notes (Signed)
Referring Physician(s): Douglas Weber  Chief Complaint: The patient is seen in follow up today s/p 10/23/18:  CT-guided placement of a drainage catheter in the left upper quadrant abscess. CT-guided placement of a drainage catheter in the left lower quadrant abscess.  History of present illness:  Pt familiar to IR service from prior LLQ diverticular abscess drain placement on 07/15/18 followed by removal on 08/12/18 and image guided aspiration of a LUQ fluid collection/abscess on 08/13/18; cx grew bacteroides. 09/11/18 f/u imaging for left renal stone evaluation also revealed residual fluid/abscess in LUQ region.   Developed worsening abd pain-- 10/23/18: new imaging revealed LUQ collection and LLQ abscess  LUQ and LLQ drains were placed in IR Pt is here today for Re CT and follow up of drains Denies fever/chills OP from upper drain is minimal - clear yellow color OP from lower drain is more brown and significantly more in volume He flushing drains 2x/day Still taking Levaquin seeing Douglas Weber again in 2 weeks  Cxs 2/22:  KLEBSIELLA PNEUMONIAE    Organism ID, Bacteria STREPTOCOCCUS ANGINOSIS    FEW KLEBSIELLA PNEUMONIAE  SUSCEPTIBILITIES PERFORMED ON PREVIOUS CULTURE WITHIN THE LAST 5 DAYS.  MODERATE BACTEROIDES FRAGILIS  BETA LACTAMASE POSITIVE    No past medical history on file.  Past Surgical History:  Procedure Laterality Date  . IR RADIOLOGIST EVAL & MGMT  07/28/2018  . IR RADIOLOGIST EVAL & MGMT  08/12/2018  . IR RADIOLOGIST EVAL & MGMT  11/03/2018    Allergies: Patient has no known allergies.  Medications: Prior to Admission medications   Medication Sig Start Date End Date Taking? Authorizing Provider  acetaminophen (TYLENOL) 325 MG tablet Take 2 tablets (650 mg total) by mouth every 6 (six) hours as needed for mild pain (or temp > 100). 10/26/18   Weber, Douglas A, PA-C  levofloxacin (LEVAQUIN) 750 MG tablet Take 1 tablet (750 mg total) by mouth daily. 10/26/18    Weber, Douglas A, PA-C  naproxen sodium (ALEVE) 220 MG tablet Take 220 mg by mouth as needed (pain).    [provider]  polyethylene glycol (MIRALAX / GLYCOLAX) packet Take 17 g by mouth daily. 10/26/18   Weber, Douglas A, PA-C  sodium chloride flush (NS) 0.9 % SOLN 3 mLs by Intracatheter route every 12 (twelve) hours. 10/26/18   Weber, Douglas Hamper, PA-C     No family history on file.  Social History   Socioeconomic History  . Marital status: Married    Spouse name: Not on file  . Number of children: 0  . Years of education: Not on file  . Highest education level: Not on file  Occupational History  . Not on file  Social Needs  . Financial resource strain: Not on file  . Food insecurity:    Worry: Not on file    Inability: Not on file  . Transportation needs:    Medical: Not on file    Non-medical: Not on file  Tobacco Use  . Smoking status: Current Some Day Smoker  . Smokeless tobacco: Never Used  Substance and Sexual Activity  . Alcohol use: Yes    Alcohol/week: 1.0 standard drinks    Types: 1 Glasses of wine per week  . Drug use: Yes    Types: Marijuana    Comment: unsure  . Sexual activity: Yes    Partners: Female  Lifestyle  . Physical activity:    Days per week: Not on file    Minutes per session: Not  on file  . Stress: Not on file  Relationships  . Social connections:    Talks on phone: Not on file    Gets together: Not on file    Attends religious service: Not on file    Active member of club or organization: Not on file    Attends meetings of clubs or organizations: Not on file    Relationship status: Not on file  Other Topics Concern  . Not on file  Social History Narrative  . Not on file     Vital Signs: BP: 122/84; T: 97.8; 02: 100%; P: 75  Physical Exam Vitals signs reviewed.  Musculoskeletal: Normal range of motion.  Skin:    General: Skin is warm and dry.     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   CT today reveals only trace collection in LUQ Still moderate collection LLQ - per Douglas Weber  Imaging: Ir Radiologist Eval & Mgmt  Result Date: 11/03/2018 Please refer to notes tab for details about interventional procedure. (Op Note)   Labs:  CBC: Recent Labs    09/30/18 1141 10/22/18 1556 10/23/18 0330 10/25/18 0414  WBC 10.7* 12.7* 8.3 6.1  HGB 13.6 12.6* 12.5* 13.3  HCT 42.9 40.9 40.7 41.6  PLT 405* 420* 362 340    COAGS: Recent Labs    07/15/18 0227 08/13/18 0944 09/30/18 1141  INR 0.99 0.96 0.98    BMP: Recent Labs    09/11/18 1624 09/30/18 1141 10/22/18 1556 10/23/18 0330  NA 135 136 134* 133*  K 3.6 4.3 3.7 3.8  CL 99 103 99 99  CO2 25 24 27 26   GLUCOSE 107* 101* 91 89  BUN 5* <5* 6 6  CALCIUM 8.7* 8.6* 8.4* 8.5*  CREATININE 0.99 0.76 0.74 0.82  GFRNONAA >60 >60 >60 >60  GFRAA >60 >60 >60 >60    LIVER FUNCTION TESTS: Recent Labs    07/10/18 1122 09/30/18 1141 10/22/18 1556  BILITOT 0.7 0.8 0.8  AST 20 17 12*  ALT 20 9 14   ALKPHOS 50 57 60  PROT 6.5 6.7 6.4*  ALBUMIN 3.3* 2.9* 3.3*    Assessment:  Hx diverticular abscess LUQ and LLQ drains placed 10/23/18 in IR OP from LUQ almost none OP LLQ copious still Plan: Continue Levaquin Follow with Douglas Weber Follow up CT and injection 2 weeks per Douglas Weber Can decrease flush to once daily Pt is agreeable   Signed: Lavonia Drafts, PA-C 11/03/2018, 11:02 AM   Please refer to Douglas. Vernard Weber attestation of this note for management and plan.

## 2018-11-17 ENCOUNTER — Other Ambulatory Visit: Payer: Self-pay

## 2018-11-17 ENCOUNTER — Ambulatory Visit
Admission: RE | Admit: 2018-11-17 | Discharge: 2018-11-17 | Disposition: A | Discharge status: Home / Self Care | Payer: BLUE CROSS/BLUE SHIELD | Source: Ambulatory Visit | Attending: General Surgery | Admitting: General Surgery

## 2018-11-17 ENCOUNTER — Other Ambulatory Visit: Payer: BLUE CROSS/BLUE SHIELD

## 2018-11-17 DIAGNOSIS — K5720 Diverticulitis of large intestine with perforation and abscess without bleeding: Secondary | ICD-10-CM

## 2018-11-17 HISTORY — PX: IR RADIOLOGIST EVAL & MGMT: IMG5224

## 2018-11-17 MED ORDER — IOPAMIDOL (ISOVUE-300) INJECTION 61%
100.0000 mL | Freq: Once | INTRAVENOUS | Status: AC | PRN
  Administered 2018-11-17: 100 mL via INTRAVENOUS

## 2018-11-18 ENCOUNTER — Other Ambulatory Visit (HOSPITAL_COMMUNITY): Payer: Self-pay | Admitting: Interventional Radiology

## 2018-11-18 DIAGNOSIS — Z8719 Personal history of other diseases of the digestive system: Secondary | ICD-10-CM

## 2018-11-18 NOTE — Progress Notes (Signed)
Patient ID: Douglas Weber, male   DOB: May 11, 1971, 48 y.o.   MRN: [id number]       Chief Complaint: Patient was seen in consultation today for follow-up abscess drains at the request of [place]  Referring Physician(s): Thomas,Alicia  History of Present Illness: Douglas Weber is a 48 y.o. male  With significant history of diverticular abscess including 07/15/2018 drain catheter placement into the left lower quadrant abscess 08/12/2018 drain catheter removal 08/13/2018 aspiration of left upper quadrant fluid collection/abscess 09/11/2018 residual left upper quadrant collection noted on CT 10/21/2018 persistent left upper quadrant collection and left lower quadrant abscess.  Drain catheters were placed. 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 He presents for follow-up.  He is having less than 5 mL out of the left upper quadrant catheter per day.  He is having about 20 mL out from the left lower quadrant per day.  No drainage around the sites.  No significant tenderness at the sites.  No past medical history on file.  Past Surgical History:  Procedure Laterality Date   IR RADIOLOGIST EVAL & MGMT  07/28/2018   IR RADIOLOGIST EVAL & MGMT  08/12/2018   IR RADIOLOGIST EVAL & MGMT  11/03/2018   IR RADIOLOGIST EVAL & MGMT  11/17/2018    Allergies: Patient has no known allergies.  Medications: Prior to Admission medications   Medication Sig Start Date End Date Taking? Authorizing Provider  acetaminophen (TYLENOL) 325 MG tablet Take 2 tablets (650 mg total) by mouth every 6 (six) hours as needed for mild pain (or temp > 100). 10/26/18   Meuth, Brooke A, PA-C  levofloxacin (LEVAQUIN) 750 MG tablet Take 1 tablet (750 mg total) by mouth daily. 10/26/18   Meuth, Brooke A, PA-C  naproxen sodium (ALEVE) 220 MG tablet Take 220 mg by mouth as needed (pain).    [provider]  polyethylene glycol  (MIRALAX / GLYCOLAX) packet Take 17 g by mouth daily. 10/26/18   Meuth, Brooke A, PA-C  sodium chloride flush (NS) 0.9 % SOLN 3 mLs by Intracatheter route every 12 (twelve) hours. 10/26/18   Meuth, Blaine Hamper, PA-C     No family history on file.  Social History   Socioeconomic History   Marital status: Married    Spouse name: Not on file   Number of children: 0   Years of education: Not on file   Highest education level: Not on file  Occupational History   Not on file  Social Needs   Financial resource strain: Not on file   Food insecurity:    Worry: Not on file    Inability: Not on file   Transportation needs:    Medical: Not on file    Non-medical: Not on file  Tobacco Use   Smoking status: Current Some Day Smoker   Smokeless tobacco: Never Used  Substance and Sexual Activity   Alcohol use: Yes    Alcohol/week: 1.0 standard drinks    Types: 1 Glasses of wine per week   Drug use: Yes    Types: Marijuana    Comment: unsure   Sexual activity: Yes    Partners: Female  Lifestyle   Physical activity:    Days per week: Not on file    Minutes per session: Not on file   Stress: Not on file  Relationships   Social connections:    Talks on phone: Not on file    Gets  together: Not on file    Attends religious service: Not on file    Active member of club or organization: Not on file    Attends meetings of clubs or organizations: Not on file    Relationship status: Not on file  Other Topics Concern   Not on file  Social History Narrative   Not on file    ECOG Status: 1 - Symptomatic but completely ambulatory       Physical Exam  Vital Signs: BP 127/75    Pulse 98    Temp 98.2 F (36.8 C)    SpO2 98%   Constitutional: Oriented to person, place, and time. Well-developed and well-nourished. No distress.   HENT:  Head: Normocephalic and atraumatic.  Eyes: Conjunctivae and EOM are normal. Right eye exhibits no discharge. Left eye exhibits no  discharge. No scleral icterus.  Neck: No JVD present.  Pulmonary/Chest: Effort normal. No stridor. No respiratory distress.  Abdomen: soft, non distended Neurological:  alert and oriented to person, place, and time.  Skin: Skin is warm and dry.  not diaphoretic.  Psychiatric:   normal mood and affect.   behavior is normal. Judgment and thought content normal.   Review of SystemsReview of Systems: A 12 point ROS discussed and pertinent positives are indicated in the HPI above.  All other systems are negative.  Mallampati Score:     Imaging: Ct Abdomen Pelvis W Contrast  Result Date: 11/17/2018 CLINICAL DATA:  Diverticulitis abscess with drain x 2 Sore at site on left drainNo surgeryNo caPrior in PACSLow left side drain - 15-20cc's dailyUpper left drain - 5cc's or less dailyFlushing 1 time daily EXAM: CT ABDOMEN AND PELVIS WITH CONTRAST TECHNIQUE: Multidetector CT imaging of the abdomen and pelvis was performed using the standard protocol following bolus administration of intravenous contrast. CONTRAST:  179mL ISOVUE-300 IOPAMIDOL (ISOVUE-300) INJECTION 61% COMPARISON:  11/03/2018 FINDINGS: Lower chest: No acute abnormality. Hepatobiliary: No focal liver abnormality is seen. No gallstones, gallbladder wall thickening, or biliary dilatation. Pancreas: Unremarkable. No pancreatic ductal dilatation or surrounding inflammatory changes. Spleen: Normal in size without focal abnormality. Stable percutaneous drain catheter posterior to the spleen with a small linear slip of fluid and gas as before. Adrenals/Urinary Tract: Adrenal glands are unremarkable. Kidneys are normal, without renal calculi, focal lesion, or hydronephrosis. Bladder is unremarkable. Stomach/Bowel: Stomach is decompressed. Small bowel is nondistended. Normal appendix. The colon is nondilated. Scattered diverticula from the distal descending and proximal sigmoid portions with some associated wall thickening. There regional mild  inflammatory/edematous changes as before. No extraluminal fluid collection to suggest abscess. 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 Vascular/Lymphatic: Calcified atheromatous aortoiliac plaque without aneurysm or stenosis. Portal vein patent. No abdominal or pelvic adenopathy. Reproductive: Mild prostatic enlargement with coarse central calcifications. Other: No ascites. No free air. Musculoskeletal: No acute or significant osseous findings. IMPRESSION: 1. 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 Catheter injection under fluoroscopy scheduled. 2. Stable perisplenic drain catheter with minimal residual fluid collection. Electronically Signed   By: Lucrezia Europe M.D.   On: 11/17/2018 11:19   Ct Abdomen Pelvis W Contrast  Result Date: 11/03/2018 : Alerts CLINICAL DATA:  Recurrent colonic diverticular abscesses, status post left upper and lower abdominal drain catheter placements 10/23/2018. Patient returns for follow-up. Continued output from the left lower quadrant drain. EXAM: CT ABDOMEN AND PELVIS WITH CONTRAST TECHNIQUE: Multidetector CT imaging  of the abdomen and pelvis was performed using the standard protocol following bolus administration of intravenous contrast. CONTRAST:  126mL ISOVUE-300 IOPAMIDOL (ISOVUE-300) INJECTION 61% COMPARISON:  10/23/2018 and previous FINDINGS: Lower chest: Stable trace left pleural effusion without evident pleural enhancement or definite loculation. Lung bases clear. Hepatobiliary: No focal liver abnormality is seen. No gallstones, gallbladder wall thickening, or biliary dilatation. Pancreas: Unremarkable. No pancreatic ductal dilatation or surrounding inflammatory changes. Spleen: Normal in size without focal abnormality. Percutaneous drain catheter is at the posterolateral margin of the splenic capsule, with  significant interval reduction in the loculated fluid collection seen previously, only with thin linear residual component measuring 0.4 cm thickness. Adrenals/Urinary Tract: Adrenal glands are unremarkable. Kidneys are normal, without renal calculi, focal lesion, or hydronephrosis. Bladder is unremarkable. Stomach/Bowel: Stomach decompressed. Small bowel nondistended. Normal appendix. Moderate proximal colonic fecal material without dilatation. Scattered distal descending and sigmoid diverticula. Right lower quadrant drain catheter anterior to the descending/sigmoid junction, with minimal residual extraluminal fluid measuring less than 0.4 cm thick. No new or undrained components. Vascular/Lymphatic: Scattered aortoiliac atheromatous calcified plaque without aneurysm or evident high-grade stenosis. No abdominal or pelvic adenopathy. Portal vein patent. Circumaortic left renal vein, an anatomic variant. Reproductive: Prostatic enlargement with central coarse calcifications. Other: No ascites. No free air. No new or undrained fluid collections. Musculoskeletal: No acute or significant osseous findings. IMPRESSION: 1. Significant improvement in left upper quadrant and left lower quadrant left lower quadrant abscesses post percutaneous drain catheter placement x2, with only thin residual components for, no new or undrained components. 2. Stable trace left pleural effusion. Electronically Signed   By: Lucrezia Europe M.D.   On: 11/03/2018 12:41   Ct Abdomen Pelvis W Contrast  Result Date: 10/22/2018 CLINICAL DATA:  History of diverticulitis and drain placement, recurrent pain EXAM: CT ABDOMEN AND PELVIS WITH CONTRAST TECHNIQUE: Multidetector CT imaging of the abdomen and pelvis was performed using the standard protocol following bolus administration of intravenous contrast. CONTRAST:  171mL ISOVUE-300 IOPAMIDOL (ISOVUE-300) INJECTION 61% COMPARISON:  09/30/2018, 09/11/2018 FINDINGS: Lower chest: Small left pleural  effusion. Hepatobiliary: No focal liver abnormality is seen. No gallstones, gallbladder wall thickening, or biliary dilatation. Pancreas: Unremarkable. No pancreatic ductal dilatation or surrounding inflammatory changes. Spleen: Normal in size without focal abnormality. Adrenals/Urinary Tract: Adrenal glands are unremarkable. Kidneys are normal, without renal calculi, focal lesion, or hydronephrosis. Bladder is unremarkable. Stomach/Bowel: Stomach is within normal limits. Appendix appears normal. No evidence of bowel wall thickening, distention, or inflammatory changes. Sigmoid diverticulosis. Vascular/Lymphatic: Calcific atherosclerosis. No enlarged abdominal or pelvic lymph nodes. Reproductive: No mass or other abnormality. Other: No abdominal wall hernia or abnormality. There is a redemonstrated multiloculated elongated air and fluid collection which spans in multiple components from the spleen, along the course of the descending colon and anterior to the sigmoid. There are multiple components which to communicate with the colon, including the mid sigmoid (series 2, image 62), and the mid descending colon (series 2, image 45). The dominant components are about the spleen and anterior to the mid sigmoid colon, measuring approximately 8.2 x 2.4 cm and 5.8 x 3.2 cm respectively. Appearance and configuration of these fluid collections is not simply changed compared to prior CT dated 09/11/2018. There are small fluid loculation in the subcutaneous and abdominal wall tissues overlying the lower collection, presumably along a prior drainage tract (series 2, image 58). Musculoskeletal: No acute or significant osseous findings. IMPRESSION: There is a redemonstrated multiloculated elongated air and fluid collection which spans in multiple components  from the spleen, along the course of the descending colon and anterior to the sigmoid. There are multiple components which to communicate with the colon, including the mid  sigmoid (series 2, image 62), and the mid descending colon (series 2, image 45). The dominant components are about the spleen and anterior to the mid sigmoid colon, measuring approximately 8.2 x 2.4 cm and 5.8 x 3.2 cm respectively. Appearance and configuration of these fluid collections is not simply changed compared to prior CT dated 09/11/2018. There are small fluid loculations in the subcutaneous and abdominal wall tissues overlying the lower collection, presumably along a prior drainage tract (series 2, image 58). Overall findings are consistent with persistent intra-abdominal abscess. Electronically Signed   By: Eddie Candle M.D.   On: 10/22/2018 15:08   Dg Sinus/fist Tube Chk-non Gi  Result Date: 11/17/2018 CLINICAL DATA:  Diverticulitis of large intestine with abscess without bleeding. Diverticular abscess, status post percutaneous drainage. Continued output from left lower quadrant drain with near complete resolution of abscess on CT. Evaluate fistula EXAM: ABSCESS DRAIN CATHETER INJECTION UNDER FLUOROSCOPY COMPARISON:  11/03/2018 TECHNIQUE: The procedure, risks (including but not limited to bleeding, infection, organ damage), benefits, and alternatives were explained to the patient. Questions regarding the procedure were encouraged and answered. The patient understands and consents to the procedure. Survey fluoroscopic inspection reveals partial retraction and unfolding of the left lower quadrant pigtail drain since prior study. Contrast material in the renal collecting systems from recent CT. 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 Small extensions of the abscess cavity along the left pericolic gutter are also evident. IMPRESSION: 1. 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 2. Partial retraction of drain catheter since previous study. Electronically Signed   By: Lucrezia Europe M.D.    On: 11/17/2018 15:49   Dg Sinus/fist Tube Chk-non Gi  Result Date: 11/03/2018 CLINICAL DATA:  Diverticular abscess, status post left upper and lower quadrant percutaneous drain catheter placement. EXAM: ABSCESS DRAIN CATHETER INJECTION UNDER FLUOROSCOPY TECHNIQUE: The procedure, risks (including but not limited to bleeding, infection, organ damage), benefits, and alternatives were explained to the patient. Questions regarding the procedure were encouraged and answered. The patient understands and consents to the procedure. Survey fluoroscopic inspection reveals stable position of the left lower quadrant drain catheter. Injection demonstrates small residual abscess cavity around the drain catheter and extending along the left pericolic gutter. There is a small fistula medially from the drain catheter, opacifying bowel. IMPRESSION: 1. Small residual left lower quadrant abscess cavity with fistula to bowel. The catheter will remain in place to allow continued healing. Electronically Signed   By: Lucrezia Europe M.D.   On: 11/03/2018 12:44   Ct Image Guided Drainage By Percutaneous Catheter  Result Date: 10/23/2018 INDICATION: 48 year old with recurrent colonic diverticular abscesses. Recent CT demonstrated abscesses in the left lower quadrant and left upper quadrant. Plan for CT-guided drain placements. EXAM: CT GUIDED DRAINAGE OF LEFT UPPER ABDOMINAL ABSCESS CT-GUIDED DRAINAGE OF LEFT LOWER ABDOMINAL ABSCESS MEDICATIONS: The patient is currently admitted to the hospital and receiving intravenous antibiotics. ANESTHESIA/SEDATION: 3.0 mg IV Versed 150 mcg IV Fentanyl Moderate Sedation Time:  52 minutes The patient was continuously monitored during the procedure by the interventional radiology nurse under my direct supervision. COMPLICATIONS: None immediate. TECHNIQUE: Informed written consent was obtained from the patient after a thorough discussion of the procedural risks, benefits and alternatives. All questions  were addressed. Maximal  Sterile Barrier Technique was utilized including caps, mask, sterile gowns, sterile gloves, sterile drape, hand hygiene and skin antiseptic. A timeout was performed prior to the initiation of the procedure. PROCEDURE: Patient was placed supine on the CT scanner. Images through the abdomen were obtained. Left side of the abdomen was shaved. Left upper quadrant abscess was initially targeted. The left lateral abdomen was prepped with chlorhexidine and sterile field was created. Skin and soft tissues anesthetized with 1% lidocaine. 35 gauge trocar needle directed into the left upper quadrant abscess with CT guidance. Yellow purulent fluid was aspirated. Stiff Amplatz wire was advanced into the collection and the tract was dilated to accommodate a 10.2 [place] multipurpose drain. Approximately 30 mL of bloody purulent fluid was aspirated from the left upper quadrant abscess. Follow up CT images were obtained. Catheter was sutured to skin and attached to a suction bulb. Attention was directed to the left lower quadrant abscess. Additional images of the left lower quadrant were obtained. Left lower quadrant was prepped with chlorhexidine and sterile field was created. Skin was anesthetized medial to the small cutaneous wound. 40 gauge trocar needle was directed into the left lower quadrant abscess with CT guidance. Yellow purulent fluid was aspirated. Stiff Amplatz wire was advanced into the collection. Tract was dilated to accommodate a 10.2 [place] multipurpose drain. Approximately 40 mL yellow purulent fluid was removed from the left lower quadrant abscess. Follow up CT images were obtained. Catheter was sutured to skin and attached to a suction bulb. FINDINGS: 30 mL of bloody purulent fluid removed from the left upper quadrant abscess. 40 mL yellow purulent fluid was removed from the left lower quadrant abscess. Left lower quadrant drain is just medial to the cutaneous wound and small  subcutaneous collection. IMPRESSION: CT-guided placement of a drainage catheter in the left upper quadrant abscess. CT-guided placement of a drainage catheter in the left lower quadrant abscess. Specimens from both abscess collections were sent for culture. Electronically Signed   By: Markus Daft M.D.   On: 10/23/2018 11:47   Ct Image Guided Drainage By Percutaneous Catheter  Result Date: 10/23/2018 INDICATION: 48 year old with recurrent colonic diverticular abscesses. Recent CT demonstrated abscesses in the left lower quadrant and left upper quadrant. Plan for CT-guided drain placements. EXAM: CT GUIDED DRAINAGE OF LEFT UPPER ABDOMINAL ABSCESS CT-GUIDED DRAINAGE OF LEFT LOWER ABDOMINAL ABSCESS MEDICATIONS: The patient is currently admitted to the hospital and receiving intravenous antibiotics. ANESTHESIA/SEDATION: 3.0 mg IV Versed 150 mcg IV Fentanyl Moderate Sedation Time:  52 minutes The patient was continuously monitored during the procedure by the interventional radiology nurse under my direct supervision. COMPLICATIONS: None immediate. TECHNIQUE: Informed written consent was obtained from the patient after a thorough discussion of the procedural risks, benefits and alternatives. All questions were addressed. Maximal Sterile Barrier Technique was utilized including caps, mask, sterile gowns, sterile gloves, sterile drape, hand hygiene and skin antiseptic. A timeout was performed prior to the initiation of the procedure. PROCEDURE: Patient was placed supine on the CT scanner. Images through the abdomen were obtained. Left side of the abdomen was shaved. Left upper quadrant abscess was initially targeted. The left lateral abdomen was prepped with chlorhexidine and sterile field was created. Skin and soft tissues anesthetized with 1% lidocaine. 49 gauge trocar needle directed into the left upper quadrant abscess with CT guidance. Yellow purulent fluid was aspirated. Stiff Amplatz wire was advanced into the  collection and the tract was dilated to accommodate a 10.2 [place] multipurpose drain. Approximately 30 mL  of bloody purulent fluid was aspirated from the left upper quadrant abscess. Follow up CT images were obtained. Catheter was sutured to skin and attached to a suction bulb. Attention was directed to the left lower quadrant abscess. Additional images of the left lower quadrant were obtained. Left lower quadrant was prepped with chlorhexidine and sterile field was created. Skin was anesthetized medial to the small cutaneous wound. 4 gauge trocar needle was directed into the left lower quadrant abscess with CT guidance. Yellow purulent fluid was aspirated. Stiff Amplatz wire was advanced into the collection. Tract was dilated to accommodate a 10.2 [place] multipurpose drain. Approximately 40 mL yellow purulent fluid was removed from the left lower quadrant abscess. Follow up CT images were obtained. Catheter was sutured to skin and attached to a suction bulb. FINDINGS: 30 mL of bloody purulent fluid removed from the left upper quadrant abscess. 40 mL yellow purulent fluid was removed from the left lower quadrant abscess. Left lower quadrant drain is just medial to the cutaneous wound and small subcutaneous collection. IMPRESSION: CT-guided placement of a drainage catheter in the left upper quadrant abscess. CT-guided placement of a drainage catheter in the left lower quadrant abscess. Specimens from both abscess collections were sent for culture. Electronically Signed   By: Markus Daft M.D.   On: 10/23/2018 11:47   Ir Radiologist Eval & Mgmt  Result Date: 11/17/2018 Please refer to notes tab for details about interventional procedure. (Op Note)  Ir Radiologist Eval & Mgmt  Result Date: 11/03/2018 Please refer to notes tab for details about interventional procedure. (Op Note)   Labs:  CBC: Recent Labs    09/30/18 1141 10/22/18 1556 10/23/18 0330 10/25/18 0414  WBC 10.7* 12.7* 8.3 6.1  HGB 13.6  12.6* 12.5* 13.3  HCT 42.9 40.9 40.7 41.6  PLT 405* 420* 362 340    COAGS: Recent Labs    07/15/18 0227 08/13/18 0944 09/30/18 1141  INR 0.99 0.96 0.98    BMP: Recent Labs    09/11/18 1624 09/30/18 1141 10/22/18 1556 10/23/18 0330  NA 135 136 134* 133*  K 3.6 4.3 3.7 3.8  CL 99 103 99 99  CO2 25 24 27 26   GLUCOSE 107* 101* 91 89  BUN 5* <5* 6 6  CALCIUM 8.7* 8.6* 8.4* 8.5*  CREATININE 0.99 0.76 0.74 0.82  GFRNONAA >60 >60 >60 >60  GFRAA >60 >60 >60 >60    LIVER FUNCTION TESTS: Recent Labs    07/10/18 1122 09/30/18 1141 10/22/18 1556  BILITOT 0.7 0.8 0.8  AST 20 17 12*  ALT 20 9 14   ALKPHOS 50 57 60  PROT 6.5 6.7 6.4*  ALBUMIN 3.3* 2.9* 3.3*    TUMOR MARKERS: No results for input(s): AFPTM, CEA, CA199, CHROMGRNA in the last 8760 hours.  Assessment and Plan:  My impression is that he is making slow but persistent progress with resolution of his left lower quadrant drain.  There is a small fistula that extends up towards left upper quadrant region.  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  For this reason, both drains were left in place for now.  I decreased his flushing volume to 3 mL twice daily.  He is scheduled to see Dr. [name] soon.    If he does not end up going to surgery, we can see him back in about 3 to 4 weeks for repeat CT and probable left lower quadrant drain injection.  Thank you for this interesting consult.  I greatly  enjoyed meeting Douglas Weber and look forward to participating in their care.  A copy of this report was sent to the requesting provider on this date.  Electronically Signed: Rickard Rhymes 11/18/2018, 11:45 AM   I spent a total of    25 Minutes in face to face in clinical consultation, greater than 50% of which was counseling/coordinating care for diverticular abscess post drainage.

## 2018-11-19 ENCOUNTER — Other Ambulatory Visit (HOSPITAL_COMMUNITY): Payer: Self-pay | Admitting: Interventional Radiology

## 2018-11-19 ENCOUNTER — Encounter (HOSPITAL_COMMUNITY): Payer: Self-pay | Admitting: Interventional Radiology

## 2018-11-19 ENCOUNTER — Other Ambulatory Visit: Payer: Self-pay

## 2018-11-19 ENCOUNTER — Ambulatory Visit (HOSPITAL_COMMUNITY)
Admission: RE | Admit: 2018-11-19 | Discharge: 2018-11-19 | Disposition: A | Discharge status: Home / Self Care | Payer: BLUE CROSS/BLUE SHIELD | Source: Ambulatory Visit | Attending: Interventional Radiology | Admitting: Interventional Radiology

## 2018-11-19 DIAGNOSIS — Z8719 Personal history of other diseases of the digestive system: Secondary | ICD-10-CM | POA: Insufficient documentation

## 2018-11-19 DIAGNOSIS — Z4803 Encounter for change or removal of drains: Secondary | ICD-10-CM | POA: Diagnosis present

## 2018-11-19 HISTORY — PX: IR SINUS/FIST TUBE CHK-NON GI: IMG673

## 2018-11-19 HISTORY — PX: IR CATHETER TUBE CHANGE: IMG717

## 2018-11-19 MED ORDER — LIDOCAINE HCL 1 % IJ SOLN
INTRAMUSCULAR | Status: AC
  Filled 2018-11-19: qty 20

## 2018-11-19 MED ORDER — LIDOCAINE HCL 1 % IJ SOLN
INTRAMUSCULAR | Status: DC | PRN
  Administered 2018-11-19: 5 mL via RESPIRATORY_TRACT

## 2018-11-19 MED ORDER — IOHEXOL 300 MG/ML  SOLN
50.0000 mL | Freq: Once | INTRAMUSCULAR | Status: AC | PRN
  Administered 2018-11-19: 20 mL

## 2018-11-19 NOTE — Procedures (Signed)
Pre procedural Dx: Perforated Diverticulitis Post procedural Dx: Same  Successful fluoroscopic guided repositioning of left lower quadrant 10 French percutaneous drainage catheter with end coiled within the diseased sigmoid colon through the patient's chronic perforation.  Attempts were made to reposition the percutaneous drainage catheter adjacent to the colon however this ultimately proved unsuccessful secondary to patient's thin body habitus.  Contrast injection was negative for definitive spillage of contrast along the catheter tract and as such the decision was made to leave the percutaneous catheter into the time the patient's upcoming surgery.   Successful removal of chronic left upper abdominal quadrant percutaneous drainage catheter following injection which demonstrate decompressed abscess cavity with majority of contrast refluxing along the catheter tract to the skin entrance site at the skin surface.   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   Otherwise, the patient and the patient's wife know to call the interventional radiology clinic with any future questions or concerns.  Ronny Bacon, MD Pager #: [phone]

## 2018-11-25 ENCOUNTER — Emergency Department (HOSPITAL_COMMUNITY): Payer: BLUE CROSS/BLUE SHIELD

## 2018-11-25 ENCOUNTER — Encounter (HOSPITAL_COMMUNITY): Payer: Self-pay | Admitting: Emergency Medicine

## 2018-11-25 ENCOUNTER — Inpatient Hospital Stay (HOSPITAL_COMMUNITY)
Admission: EM | Admit: 2018-11-25 | Discharge: 2018-11-28 | DRG: 372 | Disposition: A | Discharge status: Home / Self Care | Payer: BLUE CROSS/BLUE SHIELD | Attending: General Surgery | Admitting: General Surgery

## 2018-11-25 ENCOUNTER — Other Ambulatory Visit: Payer: Self-pay

## 2018-11-25 DIAGNOSIS — F17210 Nicotine dependence, cigarettes, uncomplicated: Secondary | ICD-10-CM | POA: Diagnosis present

## 2018-11-25 DIAGNOSIS — K5720 Diverticulitis of large intestine with perforation and abscess without bleeding: Secondary | ICD-10-CM | POA: Diagnosis present

## 2018-11-25 DIAGNOSIS — K651 Peritoneal abscess: Secondary | ICD-10-CM | POA: Diagnosis present

## 2018-11-25 DIAGNOSIS — Z79899 Other long term (current) drug therapy: Secondary | ICD-10-CM | POA: Diagnosis not present

## 2018-11-25 DIAGNOSIS — I708 Atherosclerosis of other arteries: Secondary | ICD-10-CM | POA: Diagnosis present

## 2018-11-25 DIAGNOSIS — F1290 Cannabis use, unspecified, uncomplicated: Secondary | ICD-10-CM | POA: Diagnosis present

## 2018-11-25 LAB — CBC WITH DIFFERENTIAL/PLATELET
Abs Immature Granulocytes: 0.05 10*3/uL (ref 0.00–0.07)
Basophils Absolute: 0.1 10*3/uL (ref 0.0–0.1)
Basophils Relative: 0 %
EOS PCT: 0 %
Eosinophils Absolute: 0 10*3/uL (ref 0.0–0.5)
HCT: 50.7 % (ref 39.0–52.0)
Hemoglobin: 16.5 g/dL (ref 13.0–17.0)
Immature Granulocytes: 0 %
Lymphocytes Relative: 14 %
Lymphs Abs: 1.6 10*3/uL (ref 0.7–4.0)
MCH: 30.3 pg (ref 26.0–34.0)
MCHC: 32.5 g/dL (ref 30.0–36.0)
MCV: 93 fL (ref 80.0–100.0)
Monocytes Absolute: 0.9 10*3/uL (ref 0.1–1.0)
Monocytes Relative: 8 %
NRBC: 0 % (ref 0.0–0.2)
Neutro Abs: 9.1 10*3/uL — ABNORMAL HIGH (ref 1.7–7.7)
Neutrophils Relative %: 78 %
Platelets: 283 10*3/uL (ref 150–400)
RBC: 5.45 MIL/uL (ref 4.22–5.81)
RDW: 16.3 % — ABNORMAL HIGH (ref 11.5–15.5)
WBC: 11.7 10*3/uL — ABNORMAL HIGH (ref 4.0–10.5)

## 2018-11-25 LAB — URINALYSIS, ROUTINE W REFLEX MICROSCOPIC
Bilirubin Urine: NEGATIVE
Glucose, UA: NEGATIVE mg/dL
Hgb urine dipstick: NEGATIVE
Ketones, ur: NEGATIVE mg/dL
Leukocytes,Ua: NEGATIVE
Nitrite: NEGATIVE
Protein, ur: NEGATIVE mg/dL
SPECIFIC GRAVITY, URINE: 1.018 (ref 1.005–1.030)
pH: 6 (ref 5.0–8.0)

## 2018-11-25 LAB — COMPREHENSIVE METABOLIC PANEL
ALT: 20 U/L (ref 0–44)
AST: 19 U/L (ref 15–41)
Albumin: 4.5 g/dL (ref 3.5–5.0)
Alkaline Phosphatase: 81 U/L (ref 38–126)
Anion gap: 12 (ref 5–15)
BUN: 9 mg/dL (ref 6–20)
CO2: 24 mmol/L (ref 22–32)
Calcium: 9.3 mg/dL (ref 8.9–10.3)
Chloride: 101 mmol/L (ref 98–111)
Creatinine, Ser: 0.74 mg/dL (ref 0.61–1.24)
GFR calc non Af Amer: >60 mL/min (ref >60)
Glucose, Bld: 93 mg/dL (ref 70–99)
Potassium: 4.1 mmol/L (ref 3.5–5.1)
Sodium: 137 mmol/L (ref 135–145)
Total Bilirubin: 1.2 mg/dL (ref 0.3–1.2)
Total Protein: 8.1 g/dL (ref 6.5–8.1)

## 2018-11-25 LAB — LACTIC ACID, PLASMA: LACTIC ACID, VENOUS: 0.8 mmol/L (ref 0.5–1.9)

## 2018-11-25 MED ORDER — KCL IN DEXTROSE-NACL 20-5-0.45 MEQ/L-%-% IV SOLN
INTRAVENOUS | Status: DC
  Administered 2018-11-25 – 2018-11-27 (×6): via INTRAVENOUS
  Filled 2018-11-25 (×9): qty 1000

## 2018-11-25 MED ORDER — IOHEXOL 300 MG/ML  SOLN
100.0000 mL | Freq: Once | INTRAMUSCULAR | Status: AC | PRN
  Administered 2018-11-25: 100 mL via INTRAVENOUS

## 2018-11-25 MED ORDER — ACETAMINOPHEN 650 MG RE SUPP: 650.0000 mg | Freq: Four times a day (QID) | RECTAL | Status: DC | PRN

## 2018-11-25 MED ORDER — PIPERACILLIN-TAZOBACTAM 3.375 G IVPB
3.3750 g | Freq: Three times a day (TID) | INTRAVENOUS | Status: DC
  Administered 2018-11-25 – 2018-11-28 (×8): 3.375 g via INTRAVENOUS
  Filled 2018-11-25 (×9): qty 50

## 2018-11-25 MED ORDER — SODIUM CHLORIDE 0.9 % IV BOLUS
1000.0000 mL | Freq: Once | INTRAVENOUS | Status: AC
  Administered 2018-11-25: 1000 mL via INTRAVENOUS

## 2018-11-25 MED ORDER — OXYCODONE HCL 5 MG PO TABS: 5.0000 mg | ORAL_TABLET | ORAL | Status: DC | PRN

## 2018-11-25 MED ORDER — HYDROMORPHONE HCL 1 MG/ML IJ SOLN: 1.0000 mg | INTRAMUSCULAR | Status: DC | PRN

## 2018-11-25 MED ORDER — ONDANSETRON 4 MG PO TBDP: 4.0000 mg | ORAL_TABLET | Freq: Four times a day (QID) | ORAL | Status: DC | PRN

## 2018-11-25 MED ORDER — ACETAMINOPHEN 500 MG PO TABS
1000.0000 mg | ORAL_TABLET | Freq: Once | ORAL | Status: AC
  Administered 2018-11-25: 1000 mg via ORAL
  Filled 2018-11-25: qty 2

## 2018-11-25 MED ORDER — PIPERACILLIN-TAZOBACTAM 3.375 G IVPB 30 MIN
3.3750 g | Freq: Once | INTRAVENOUS | Status: AC
  Administered 2018-11-25: 3.375 g via INTRAVENOUS
  Filled 2018-11-25: qty 50

## 2018-11-25 MED ORDER — ONDANSETRON HCL 4 MG/2ML IJ SOLN: 4.0000 mg | Freq: Four times a day (QID) | INTRAMUSCULAR | Status: DC | PRN

## 2018-11-25 MED ORDER — ACETAMINOPHEN 325 MG PO TABS
650.0000 mg | ORAL_TABLET | Freq: Four times a day (QID) | ORAL | Status: DC | PRN
  Administered 2018-11-26: 650 mg via ORAL
  Filled 2018-11-25: qty 2

## 2018-11-25 NOTE — ED Notes (Signed)
ED TO INPATIENT HANDOFF REPORT  ED Nurse Name and Phone #: Lenea Bywater RN  S Name/Age/Gender Ranelle Oyster 48 y.o. male Room/Bed: WA05/WA05  Code Status   Code Status: Full Code  Home/SNF/Other Home Patient oriented to: self, place, time and situation Is this baseline? Yes   Triage Complete: Triage complete  Chief Complaint fever   Triage Note Pt reports that had diverticulitis and abscesses and had drains placed. Had the one in LLQ placed on Friday and today started having fevers at home, pain at insertion site and patient thinks has foul odor.    Allergies No Known Allergies  Level of Care/Admitting Diagnosis ED Disposition    ED Disposition Condition Comment   Admit  Hospital Area: [place] [[id number]]  Level of Care: Med-Surg [16]  Diagnosis: Diverticulitis of large intestine with perforation and abscess [[id number]]  Admitting Physician: CCS, Higginson  Attending Physician: CCS, MD [[id number]]  Estimated length of stay: 3 - 4 days  Certification:: I certify this patient will need inpatient services for at least 2 midnights  PT Class (Do Not Modify): Inpatient [101]  PT Acc Code (Do Not Modify): Private [1]       B Medical/Surgery History History reviewed. No pertinent past medical history. Past Surgical History:  Procedure Laterality Date  . IR CATHETER TUBE CHANGE  11/19/2018  . IR RADIOLOGIST EVAL & MGMT  07/28/2018  . IR RADIOLOGIST EVAL & MGMT  08/12/2018  . IR RADIOLOGIST EVAL & MGMT  11/03/2018  . IR RADIOLOGIST EVAL & MGMT  11/17/2018  . IR SINUS/FIST TUBE CHK-NON GI  11/19/2018     A IV Location/Drains/Wounds Patient Lines/Drains/Airways Status   Active Line/Drains/Airways    Name:   Placement date:   Placement time:   Site:   Days:   Peripheral IV 11/25/18 Left Antecubital   11/25/18    1420    Antecubital   less than 1   Closed System Drain 1 Left Abdomen   07/15/18    1159    Abdomen   133   Closed System Drain 1 Left LUQ Bulb (JP) 10  Fr.   10/23/18    0954    LUQ   33   Closed System Drain 2 Left LLQ Bulb (JP) 10 Fr.   10/23/18    1026    LLQ   33   Closed System Drain 1 Anterior;Left LLQ Other (Comment) 10.2 Fr.   11/19/18    1416    LLQ   6   Incision (Closed) 07/15/18   07/15/18    1158     133   Incision (Closed) 08/13/18 Chest Left;Mid;Lateral   08/13/18    1146     104   Incision (Closed) 09/30/18 Abdomen Left;Upper   09/30/18    1420     56   Wound / Incision (Open or Dehisced) 10/22/18   10/22/18    1557    -   34          Intake/Output Last 24 hours  Intake/Output Summary (Last 24 hours) at 11/25/2018 2021 Last data filed at 11/25/2018 1539 Gross per 24 hour  Intake 1050 ml  Output -  Net 1050 ml    Labs/Imaging Results for orders placed or performed during the hospital encounter of 11/25/18 (from the past 48 hour(s))  Urinalysis, Routine w reflex microscopic     Status: None   Collection Time: 11/25/18  1:50 PM  Result Value Ref Range  Color, Urine YELLOW YELLOW   APPearance CLEAR CLEAR   Specific Gravity, Urine 1.018 1.005 - 1.030   pH 6.0 5.0 - 8.0   Glucose, UA NEGATIVE NEGATIVE mg/dL   Hgb urine dipstick NEGATIVE NEGATIVE   Bilirubin Urine NEGATIVE NEGATIVE   Ketones, ur NEGATIVE NEGATIVE mg/dL   Protein, ur NEGATIVE NEGATIVE mg/dL   Nitrite NEGATIVE NEGATIVE   Leukocytes,Ua NEGATIVE NEGATIVE    Comment: Performed at [facility] [address], [place], [place] [address]  Lactic acid, plasma     Status: None   Collection Time: 11/25/18  2:13 PM  Result Value Ref Range   Lactic Acid, Venous 0.8 0.5 - 1.9 mmol/L    Comment: Performed at [facility], [place] [address], [place], [place] [address]  Comprehensive metabolic panel     Status: None   Collection Time: 11/25/18  2:13 PM  Result Value Ref Range   Sodium 137 135 - 145 mmol/L   Potassium 4.1 3.5 - 5.1 mmol/L   Chloride 101 98 - 111 mmol/L   CO2 24 22 - 32 mmol/L   Glucose, Bld 93 70 - 99  mg/dL   BUN 9 6 - 20 mg/dL   Creatinine, Ser 0.74 0.61 - 1.24 mg/dL   Calcium 9.3 8.9 - 10.3 mg/dL   Total Protein 8.1 6.5 - 8.1 g/dL   Albumin 4.5 3.5 - 5.0 g/dL   AST 19 15 - 41 U/L   ALT 20 0 - 44 U/L   Alkaline Phosphatase 81 38 - 126 U/L   Total Bilirubin 1.2 0.3 - 1.2 mg/dL   GFR calc non Af Amer >60 >60 mL/min   GFR calc Af Amer >60 >60 mL/min   Anion gap 12 5 - 15    Comment: Performed at [facility], [place] [address], [place], [place] [address]  CBC with Differential     Status: Abnormal   Collection Time: 11/25/18  2:13 PM  Result Value Ref Range   WBC 11.7 (H) 4.0 - 10.5 K/uL   RBC 5.45 4.22 - 5.81 MIL/uL   Hemoglobin 16.5 13.0 - 17.0 g/dL   HCT 50.7 39.0 - 52.0 %   MCV 93.0 80.0 - 100.0 fL   MCH 30.3 26.0 - 34.0 pg   MCHC 32.5 30.0 - 36.0 g/dL   RDW 16.3 (H) 11.5 - 15.5 %   Platelets 283 150 - 400 K/uL   nRBC 0.0 0.0 - 0.2 %   Neutrophils Relative % 78 %   Neutro Abs 9.1 (H) 1.7 - 7.7 K/uL   Lymphocytes Relative 14 %   Lymphs Abs 1.6 0.7 - 4.0 K/uL   Monocytes Relative 8 %   Monocytes Absolute 0.9 0.1 - 1.0 K/uL   Eosinophils Relative 0 %   Eosinophils Absolute 0.0 0.0 - 0.5 K/uL   Basophils Relative 0 %   Basophils Absolute 0.1 0.0 - 0.1 K/uL   Immature Granulocytes 0 %   Abs Immature Granulocytes 0.05 0.00 - 0.07 K/uL    Comment: Performed at [facility], [place] [address] [place], [place] [address]   Dg Chest 2 View  Result Date: 11/25/2018 CLINICAL DATA:  Fever today.  History of smoking. EXAM: CHEST - 2 VIEW COMPARISON:  07/14/2018 FINDINGS: Cardiac silhouette is normal in size. No mediastinal or hilar masses. There is no evidence of adenopathy. Clear lungs.  No pleural effusion or pneumothorax. Skeletal structures are intact. IMPRESSION: No active cardiopulmonary disease. Electronically Signed  By: Lajean Manes M.D.   On: 11/25/2018 14:50   Ct Abdomen Pelvis W Contrast  Result Date: 11/25/2018 CLINICAL DATA:  Pain  at drain site with erythema and low-grade fever. Drain placement last Friday. EXAM: CT ABDOMEN AND PELVIS WITH CONTRAST TECHNIQUE: Multidetector CT imaging of the abdomen and pelvis was performed using the standard protocol following bolus administration of intravenous contrast. CONTRAST:  151mL OMNIPAQUE IOHEXOL 300 MG/ML  SOLN COMPARISON:  11/17/2018 CT FINDINGS: Lower chest: Bibasilar dependent atelectasis. The included heart size is within normal limits. Hepatobiliary: No focal liver abnormality is seen. No gallstones, gallbladder wall thickening, or biliary dilatation. Pancreas: Unremarkable. No pancreatic ductal dilatation or surrounding inflammatory changes. Spleen: Normal in size without focal abnormality. Adrenals/Urinary Tract: Mild nodularity of the posterior limbs of both adrenal glands possibly representing small benign adenomata, series 2/20 measuring up to 15 mm on the right and 12 mm on left. Symmetric cortical enhancement of both kidneys without mass, nephrolithiasis nor obstructive uropathy. The urinary bladder is unremarkable for the degree of distention. Stomach/Bowel: Nondistended stomach. Normal duodenal sweep and ligament of Treitz position. Bowel obstruction. Normal appendix. Nondistended large bowel with moderate stool retention. Low-density intramural abscess along lateral aspect of the descending colon measuring 2.7 x 1.6 x 6 cm, series 2/43 and series 6/144. Additionally, interval development pericolonic abscess in the left lower quadrant with air-fluid level partially traversed by percutaneous drainage catheter though the tip is situated more within the previously affected area of sigmoid colon. This is somewhat teardrop in shape greatest along its caudal aspect measuring 7 x 3 cm with overall craniocaudad length of 9 cm, series 2/62 and series 6/129. Vascular/Lymphatic: Calcified atheromatous aortoiliac plaquing without aneurysm or stenosis. No adenopathy. Reproductive: Mild prostatic  enlargement with coarse central zone calcifications. Other: No free air nor free fluid. Musculoskeletal: No acute or significant osseous findings. IMPRESSION: 1. 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 2. Development of tracking intramural abscess along the descending colon measuring 6 cm in length by 2.7 x 1.6 cm. 3. Small hypodense nodules of the adrenal glands likely representing benign adenomata. 4. Aortoiliac atherosclerosis without aneurysm. 5. Percutaneous drainage catheter traverses the left lower quadrant abscess but terminates within the wall of the sigmoid colon. Electronically Signed   By: Ashley Royalty M.D.   On: 11/25/2018 17:02    Pending Labs Unresulted Labs (From admission, onward)    Start     Ordered   11/25/18 1354  Blood Culture (routine x 2)  BLOOD CULTURE X 2,   STAT     11/25/18 1353   11/25/18 1329  Lactic acid, plasma  Now then every 2 hours,   STAT     11/25/18 1328          Vitals/Pain Today's Vitals   11/25/18 1713 11/25/18 1813 11/25/18 2007 11/25/18 2012  BP:  116/86  (!) 149/86  Pulse:  (!) 102  99  Resp:  17  16  Temp: 99.2 F (37.3 C)     TempSrc: Oral     SpO2:  100%  99%  Weight:      Height:      PainSc:   0-No pain     Isolation Precautions No active isolations  Medications Medications  dextrose 5 % and 0.45 % NaCl with KCl 20 mEq/L infusion (has no administration in time range)  piperacillin-tazobactam (ZOSYN)  IVPB 3.375 g (has no administration in time range)  acetaminophen (TYLENOL) tablet 650 mg (has no administration in time range)    Or  acetaminophen (TYLENOL) suppository 650 mg (has no administration in time range)  oxyCODONE (Oxy IR/ROXICODONE) immediate release tablet 5-10 mg (has no administration in time range)  HYDROmorphone (DILAUDID) injection 1 mg (has no administration in time range)  ondansetron  (ZOFRAN-ODT) disintegrating tablet 4 mg (has no administration in time range)    Or  ondansetron (ZOFRAN) injection 4 mg (has no administration in time range)  sodium chloride 0.9 % bolus 1,000 mL (0 mLs Intravenous Stopped 11/25/18 1539)  acetaminophen (TYLENOL) tablet 1,000 mg (1,000 mg Oral Given 11/25/18 1358)  piperacillin-tazobactam (ZOSYN) IVPB 3.375 g (0 g Intravenous Stopped 11/25/18 1457)  iohexol (OMNIPAQUE) 300 MG/ML solution 100 mL (100 mLs Intravenous Contrast Given 11/25/18 1620)    Mobility ambulatory Low fall risk   Focused Assessments Skin   R Recommendations: See Admitting Provider Note  Report given to: Malachi Carl, RN  Additional Notes: N/A

## 2018-11-25 NOTE — ED Provider Notes (Signed)
[facility] DEPT Provider Note   CSN: [id number] Arrival date & time: 11/25/18  1302    History   Chief Complaint Chief Complaint  Patient presents with   Fever   pain at drain site    HPI Douglas Weber is a 48 y.o. male with PMH/o diverticulitis with intra-abdominal abscess who presents for evaluation fever and pain to abdominal drain site. Patient with recent history of diverticulitis with development of abscess and drain insertion. He had the drain removed and a different one inserted last week with IR. Patient reports that last night he started developing worsening pain to his drainage site. He states that it is worse with movement. Additionally, patient reports that this morning he developed a fever at home of 100.5. He has not taken any medication. He called his surgeon who advised him to go to the ED for further evaluation. Patient states that he has had some drainage output and has not noticed any different color. He has not had any nausea/vomiting. Patient states that he has not had any sick contacts or any recent travel. He denies any nausea/vomiting, CP, SOB, cough, congestion.      The history is provided by the patient.    History reviewed. No pertinent past medical history.  Patient Active Problem List   Diagnosis Date Noted   Diverticulitis of large intestine with perforation and abscess 11/25/2018   Intraabdominal fluid collection 10/22/2018   Hypokalemia 07/17/2018   Intra-abdominal abscess s/p perc drainage 07/15/2018 07/17/2018   Diverticulitis of large intestine with abscess 07/10/2018    Past Surgical History:  Procedure Laterality Date   IR CATHETER TUBE CHANGE  11/19/2018   IR RADIOLOGIST EVAL & MGMT  07/28/2018   IR RADIOLOGIST EVAL & MGMT  08/12/2018   IR RADIOLOGIST EVAL & MGMT  11/03/2018   IR RADIOLOGIST EVAL & MGMT  11/17/2018   IR SINUS/FIST TUBE CHK-NON GI  11/19/2018        Home Medications     Prior to Admission medications   Medication Sig Start Date End Date Taking? Authorizing Provider  acetaminophen (TYLENOL) 325 MG tablet Take 2 tablets (650 mg total) by mouth every 6 (six) hours as needed for mild pain (or temp > 100). 10/26/18  Yes Meuth, Brooke A, PA-C  levofloxacin (LEVAQUIN) 750 MG tablet Take 1 tablet (750 mg total) by mouth daily. Patient not taking: Reported on 11/25/2018 10/26/18   Margie Billet A, PA-C  polyethylene glycol (MIRALAX / GLYCOLAX) packet Take 17 g by mouth daily. Patient not taking: Reported on 11/25/2018 10/26/18   Margie Billet A, PA-C  sodium chloride flush (NS) 0.9 % SOLN 3 mLs by Intracatheter route every 12 (twelve) hours. 10/26/18   Meuth, Blaine Hamper, PA-C    Family History No family history on file.  Social History Social History   Tobacco Use   Smoking status: Current Some Day Smoker   Smokeless tobacco: Never Used  Substance Use Topics   Alcohol use: Yes    Alcohol/week: 1.0 standard drinks    Types: 1 Glasses of wine per week   Drug use: Yes    Types: Marijuana    Comment: unsure     Allergies   Patient has no known allergies.   Review of Systems Review of Systems  Constitutional: Positive for fever.  HENT: Negative for congestion.   Respiratory: Negative for cough and shortness of breath.   Cardiovascular: Negative for chest pain.  Gastrointestinal: Positive for abdominal pain. Negative  for nausea and vomiting.  Genitourinary: Negative for dysuria and hematuria.  Neurological: Negative for headaches.  All other systems reviewed and are negative.    Physical Exam Updated Vital Signs BP (!) 149/86 (BP Location: Right Arm)    Pulse 99    Temp 99.2 F (37.3 C) (Oral)    Resp 16    Ht 5\' 9"  (1.753 m)    Wt 74 kg    SpO2 99%    BMI 24.08 kg/m   Physical Exam Vitals signs and nursing note reviewed.  Constitutional:      Appearance: Normal appearance. He is well-developed.  HENT:     Head: Normocephalic and  atraumatic.  Eyes:     General: Lids are normal.     Conjunctiva/sclera: Conjunctivae normal.     Pupils: Pupils are equal, round, and reactive to light.  Neck:     Musculoskeletal: Full passive range of motion without pain.  Cardiovascular:     Rate and Rhythm: Normal rate and regular rhythm.     Pulses: Normal pulses.     Heart sounds: Normal heart sounds. No murmur. No friction rub. No gallop.   Pulmonary:     Effort: Pulmonary effort is normal.     Breath sounds: Normal breath sounds.     Comments: Lungs clear to auscultation bilaterally.  Symmetric chest rise.  No wheezing, rales, rhonchi. Abdominal:     Palpations: Abdomen is soft. Abdomen is not rigid.     Tenderness: There is abdominal tenderness in the left lower quadrant. There is no guarding.       Comments: Mild tenderness to palpation noted to the LLQ. No rigidity. No guarding.   Musculoskeletal: Normal range of motion.  Skin:    General: Skin is warm and dry.     Capillary Refill: Capillary refill takes less than 2 seconds.  Neurological:     Mental Status: He is alert and oriented to person, place, and time.  Psychiatric:        Speech: Speech normal.      ED Treatments / Results  Labs (all labs ordered are listed, but only abnormal results are displayed) Labs Reviewed  CBC WITH DIFFERENTIAL/PLATELET - Abnormal; Notable for the following components:      Result Value   WBC 11.7 (*)    RDW 16.3 (*)    Neutro Abs 9.1 (*)    All other components within normal limits  CULTURE, BLOOD (ROUTINE X 2)  CULTURE, BLOOD (ROUTINE X 2)  LACTIC ACID, PLASMA  COMPREHENSIVE METABOLIC PANEL  URINALYSIS, ROUTINE W REFLEX MICROSCOPIC  LACTIC ACID, PLASMA    EKG EKG Interpretation  Date/Time:  Thursday November 25 2018 14:33:28 EDT Ventricular Rate:  85 PR Interval:    QRS Duration: 83 QT Interval:  342 QTC Calculation: 407 R Axis:   75 Text Interpretation:  Sinus rhythm Borderline short PR interval Borderline T  abnormalities, inferior leads Confirmed by Lennice Sites [phone]) on 11/25/2018 2:40:57 PM   Radiology Dg Chest 2 View  Result Date: 11/25/2018 CLINICAL DATA:  Fever today.  History of smoking. EXAM: CHEST - 2 VIEW COMPARISON:  07/14/2018 FINDINGS: Cardiac silhouette is normal in size. No mediastinal or hilar masses. There is no evidence of adenopathy. Clear lungs.  No pleural effusion or pneumothorax. Skeletal structures are intact. IMPRESSION: No active cardiopulmonary disease. Electronically Signed   By: Lajean Manes M.D.   On: 11/25/2018 14:50   Ct Abdomen Pelvis W Contrast  Result Date: 11/25/2018 CLINICAL  DATA:  Pain at drain site with erythema and low-grade fever. Drain placement last Friday. EXAM: CT ABDOMEN AND PELVIS WITH CONTRAST TECHNIQUE: Multidetector CT imaging of the abdomen and pelvis was performed using the standard protocol following bolus administration of intravenous contrast. CONTRAST:  1102mL OMNIPAQUE IOHEXOL 300 MG/ML  SOLN COMPARISON:  11/17/2018 CT FINDINGS: Lower chest: Bibasilar dependent atelectasis. The included heart size is within normal limits. Hepatobiliary: No focal liver abnormality is seen. No gallstones, gallbladder wall thickening, or biliary dilatation. Pancreas: Unremarkable. No pancreatic ductal dilatation or surrounding inflammatory changes. Spleen: Normal in size without focal abnormality. Adrenals/Urinary Tract: Mild nodularity of the posterior limbs of both adrenal glands possibly representing small benign adenomata, series 2/20 measuring up to 15 mm on the right and 12 mm on left. Symmetric cortical enhancement of both kidneys without mass, nephrolithiasis nor obstructive uropathy. The urinary bladder is unremarkable for the degree of distention. Stomach/Bowel: Nondistended stomach. Normal duodenal sweep and ligament of Treitz position. Bowel obstruction. Normal appendix. Nondistended large bowel with moderate stool retention. Low-density intramural abscess  along lateral aspect of the descending colon measuring 2.7 x 1.6 x 6 cm, series 2/43 and series 6/144. Additionally, interval development pericolonic abscess in the left lower quadrant with air-fluid level partially traversed by percutaneous drainage catheter though the tip is situated more within the previously affected area of sigmoid colon. This is somewhat teardrop in shape greatest along its caudal aspect measuring 7 x 3 cm with overall craniocaudad length of 9 cm, series 2/62 and series 6/129. Vascular/Lymphatic: Calcified atheromatous aortoiliac plaquing without aneurysm or stenosis. No adenopathy. Reproductive: Mild prostatic enlargement with coarse central zone calcifications. Other: No free air nor free fluid. Musculoskeletal: No acute or significant osseous findings. IMPRESSION: 1. 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 2. Development of tracking intramural abscess along the descending colon measuring 6 cm in length by 2.7 x 1.6 cm. 3. Small hypodense nodules of the adrenal glands likely representing benign adenomata. 4. Aortoiliac atherosclerosis without aneurysm. 5. Percutaneous drainage catheter traverses the left lower quadrant abscess but terminates within the wall of the sigmoid colon. Electronically Signed   By: Ashley Royalty M.D.   On: 11/25/2018 17:02    Procedures .Critical Care Performed by: Volanda Napoleon, PA-C Authorized by: Volanda Napoleon, PA-C   Critical care provider statement:    Critical care time (minutes):  45   Critical care was necessary to treat or prevent imminent or life-threatening deterioration of the following conditions:  Sepsis   Critical care was time spent personally by me on the following activities:  Discussions with consultants, evaluation of patient's response to treatment, examination of patient, ordering and performing  treatments and interventions, ordering and review of laboratory studies, ordering and review of radiographic studies, pulse oximetry, re-evaluation of patient's condition, obtaining history from patient or surrogate and review of old charts   (including critical care time)  Medications Ordered in ED Medications  dextrose 5 % and 0.45 % NaCl with KCl 20 mEq/L infusion (has no administration in time range)  piperacillin-tazobactam (ZOSYN) IVPB 3.375 g (has no administration in time range)  acetaminophen (TYLENOL) tablet 650 mg (has no administration in time range)    Or  acetaminophen (TYLENOL) suppository 650 mg (has no administration in time range)  oxyCODONE (Oxy IR/ROXICODONE) immediate release tablet 5-10 mg (has no administration in time range)  HYDROmorphone (  DILAUDID) injection 1 mg (has no administration in time range)  ondansetron (ZOFRAN-ODT) disintegrating tablet 4 mg (has no administration in time range)    Or  ondansetron (ZOFRAN) injection 4 mg (has no administration in time range)  sodium chloride 0.9 % bolus 1,000 mL (0 mLs Intravenous Stopped 11/25/18 1539)  acetaminophen (TYLENOL) tablet 1,000 mg (1,000 mg Oral Given 11/25/18 1358)  piperacillin-tazobactam (ZOSYN) IVPB 3.375 g (0 g Intravenous Stopped 11/25/18 1457)  iohexol (OMNIPAQUE) 300 MG/ML solution 100 mL (100 mLs Intravenous Contrast Given 11/25/18 1620)     Initial Impression / Assessment and Plan / ED Course  I have reviewed the triage vital signs and the nursing notes.  Pertinent labs & imaging results that were available during my care of the patient were reviewed by me and considered in my medical decision making (see chart for details).        48 y.o. M with PMH/o diverticulitis with abscess with intra-abdominal drain who presents for fever and pain to drain site. Called surgeon who prompted to go to the ED. No recent travel, no known sick contacts. Patient is non-toxic appearing, sitting comfortably on  examination table. Vital signs reviewed. Patient is febrile, tachycardic and hypertensive. Code sepsis initiated.   UA negative for infectious etiology. CBC shows leukocytosis. CXR negative for any infectious process.  Lactic acid is normal.  CMP is unremarkable.  Code sepsis initiated.  We will plan to give patient a dose of Zosyn.  CT scan shows recurrence of left lower quadrant pericolonic abscess with air-fluid level with development of tracking intramural abscess along the descending colon.   Discussed patient with Dr. Harlow Asa (Surgery).  Will plan to evaluate patient in the ED.  Patient will need admission with his for IR to put a new drainage tomorrow. Appreciate surgical consult.   Updated patient on plan. He is agreeable.   Portions of this note were generated with [employer]. Dictation errors may occur despite best attempts at proofreading.   Final Clinical Impressions(s) / ED Diagnoses   Final diagnoses:  Intra-abdominal abscess [facility])    ED Discharge Orders    None       Volanda Napoleon, PA-C 11/25/18 2019    Lennice Sites, DO 11/26/18 [phone]

## 2018-11-25 NOTE — ED Notes (Signed)
RN attempted IV x1 without success

## 2018-11-25 NOTE — ED Triage Notes (Signed)
Pt reports that had diverticulitis and abscesses and had drains placed. Had the one in LLQ placed on Friday and today started having fevers at home, pain at insertion site and patient thinks has foul odor.

## 2018-11-26 ENCOUNTER — Encounter (HOSPITAL_COMMUNITY): Payer: Self-pay | Admitting: Diagnostic Radiology

## 2018-11-26 ENCOUNTER — Inpatient Hospital Stay (HOSPITAL_COMMUNITY): Payer: BLUE CROSS/BLUE SHIELD

## 2018-11-26 HISTORY — PX: IR CATHETER TUBE CHANGE: IMG717

## 2018-11-26 LAB — CBC
HCT: 42.5 % (ref 39.0–52.0)
Hemoglobin: 13.7 g/dL (ref 13.0–17.0)
MCH: 30.1 pg (ref 26.0–34.0)
MCHC: 32.2 g/dL (ref 30.0–36.0)
MCV: 93.4 fL (ref 80.0–100.0)
PLATELETS: 254 10*3/uL (ref 150–400)
RBC: 4.55 MIL/uL (ref 4.22–5.81)
RDW: 15.5 % (ref 11.5–15.5)
WBC: 12.4 10*3/uL — ABNORMAL HIGH (ref 4.0–10.5)
nRBC: 0 % (ref 0.0–0.2)

## 2018-11-26 LAB — BASIC METABOLIC PANEL
Anion gap: 9 (ref 5–15)
BUN: 6 mg/dL (ref 6–20)
CO2: 22 mmol/L (ref 22–32)
Calcium: 8.3 mg/dL — ABNORMAL LOW (ref 8.9–10.3)
Chloride: 102 mmol/L (ref 98–111)
Creatinine, Ser: 0.8 mg/dL (ref 0.61–1.24)
GFR calc Af Amer: >60 mL/min (ref >60)
GLUCOSE: 121 mg/dL — AB (ref 70–99)
Potassium: 3.6 mmol/L (ref 3.5–5.1)
SODIUM: 133 mmol/L — AB (ref 135–145)

## 2018-11-26 MED ORDER — FENTANYL CITRATE (PF) 100 MCG/2ML IJ SOLN
INTRAMUSCULAR | Status: DC | PRN
  Administered 2018-11-26: 100 ug via INTRAVENOUS

## 2018-11-26 MED ORDER — LIDOCAINE HCL 1 % IJ SOLN
INTRAMUSCULAR | Status: DC | PRN
  Administered 2018-11-26: 10 mL via INTRADERMAL

## 2018-11-26 MED ORDER — LIDOCAINE HCL 1 % IJ SOLN
INTRAMUSCULAR | Status: AC
  Filled 2018-11-26: qty 20

## 2018-11-26 MED ORDER — SODIUM CHLORIDE 0.9% FLUSH
5.0000 mL | Freq: Three times a day (TID) | INTRAVENOUS | Status: DC
  Administered 2018-11-26 – 2018-11-27 (×3): 5 mL

## 2018-11-26 MED ORDER — IOHEXOL 300 MG/ML  SOLN
50.0000 mL | Freq: Once | INTRAMUSCULAR | Status: AC | PRN
  Administered 2018-11-26: 20 mL

## 2018-11-26 MED ORDER — FENTANYL CITRATE (PF) 100 MCG/2ML IJ SOLN
INTRAMUSCULAR | Status: AC
  Filled 2018-11-26: qty 2

## 2018-11-26 NOTE — H&P (Addendum)
Douglas Weber  Douglas Weber [birth year]  [id number].    Chief Complaint/Reason for Consult: recurrent diverticulitis with abscess HPI:  Patient is an otherwise healthy 48 year old male with recurrent diverticulitis. Recently readmitted 2/22 - 2/25 of this year and had IR drains placed for intra-abdominal abscesses. Seen for drain repositioning 3/20. Since then patient developed increased pain at drain site and fevers. Patient also reports that drainage is now foul-smelling. Patient denies chills, chest pain, SOB, cough, nausea, vomiting, constipation, bloody stools, melena, urinary symptoms. Patient denies diarrhea but does report some loose stools. No allergies to abx. No past abdominal surgery.   ROS: Review of Systems  Constitutional: Positive for fever. Negative for chills.  Respiratory: Negative for cough and shortness of breath.   Cardiovascular: Negative for chest pain and palpitations.  Gastrointestinal: Positive for abdominal pain. Negative for blood in stool, constipation, diarrhea, melena, nausea and vomiting.  Genitourinary: Negative for dysuria, frequency and urgency.  All other systems reviewed and are negative.   No family history on file.  History reviewed. No pertinent past medical history.  Past Surgical History:  Procedure Laterality Date  . IR CATHETER TUBE CHANGE  11/19/2018  . IR RADIOLOGIST EVAL & MGMT  07/28/2018  . IR RADIOLOGIST EVAL & MGMT  08/12/2018  . IR RADIOLOGIST EVAL & MGMT  11/03/2018  . IR RADIOLOGIST EVAL & MGMT  11/17/2018  . IR SINUS/FIST TUBE CHK-NON GI  11/19/2018    Social History:  reports that he has been smoking. He has never used smokeless tobacco. He reports current alcohol use of about 1.0 standard drinks of alcohol per week. He reports current drug use. Drug: Marijuana.  Allergies: No Known Allergies  Medications Prior to Admission  Medication Sig Dispense Refill  . acetaminophen (TYLENOL) 325 MG tablet  Take 2 tablets (650 mg total) by mouth every 6 (six) hours as needed for mild pain (or temp > 100).    Marland Kitchen levofloxacin (LEVAQUIN) 750 MG tablet Take 1 tablet (750 mg total) by mouth daily. (Patient not taking: Reported on 11/25/2018) 14 tablet 0  . polyethylene glycol (MIRALAX / GLYCOLAX) packet Take 17 g by mouth daily. (Patient not taking: Reported on 11/25/2018) 14 each 0  . sodium chloride flush (NS) 0.9 % SOLN 3 mLs by Intracatheter route every 12 (twelve) hours. 30 Syringe 3    Blood pressure 123/72, pulse 78, temperature 98.8 F (37.1 C), temperature source Oral, resp. rate 16, height 5\' 9"  (1.753 m), weight 74 kg, SpO2 97 %. Physical Exam: Physical Exam Constitutional:      General: He is not in acute distress.    Appearance: He is well-developed and normal weight. He is not toxic-appearing.  HENT:     Head: Normocephalic and atraumatic.     Right Ear: External ear normal.     Left Ear: External ear normal.     Nose: Nose normal.     Mouth/Throat:     Lips: Pink.  Eyes:     General: Lids are normal. No scleral icterus.    Extraocular Movements: Extraocular movements intact.     Conjunctiva/sclera: Conjunctivae normal.  Neck:     Musculoskeletal: Normal range of motion and neck supple.  Cardiovascular:     Rate and Rhythm: Normal rate and regular rhythm.     Pulses:          Radial pulses are 2+ on the right side and 2+ on the left side.  Dorsalis pedis pulses are 2+ on the right side and 2+ on the left side.  Pulmonary:     Effort: Pulmonary effort is normal.     Breath sounds: Normal breath sounds.  Abdominal:     General: Bowel sounds are normal. There is no distension.     Palpations: Abdomen is soft. There is no hepatomegaly or splenomegaly.     Tenderness: There is abdominal tenderness in the left lower quadrant. There is no guarding or rebound.     Hernia: No hernia is present.  Musculoskeletal:     Comments: ROM grossly intact in bilateral upper and lower  extremities  Skin:    General: Skin is warm and dry.     Findings: No rash.  Neurological:     Mental Status: He is alert and oriented to person, place, and time.  Psychiatric:        Attention and Perception: Attention and perception normal.        Mood and Affect: Mood and affect normal.        Behavior: Behavior is cooperative.     Results for orders placed or performed during the hospital encounter of 11/25/18 (from the past 48 hour(s))  Urinalysis, Routine w reflex microscopic     Status: None   Collection Time: 11/25/18  1:50 PM  Result Value Ref Range   Color, Urine YELLOW YELLOW   APPearance CLEAR CLEAR   Specific Gravity, Urine 1.018 1.005 - 1.030   pH 6.0 5.0 - 8.0   Glucose, UA NEGATIVE NEGATIVE mg/dL   Hgb urine dipstick NEGATIVE NEGATIVE   Bilirubin Urine NEGATIVE NEGATIVE   Ketones, ur NEGATIVE NEGATIVE mg/dL   Protein, ur NEGATIVE NEGATIVE mg/dL   Nitrite NEGATIVE NEGATIVE   Leukocytes,Ua NEGATIVE NEGATIVE    Comment: Performed at [facility] [address], [place], [place] [address]  Lactic acid, plasma     Status: None   Collection Time: 11/25/18  2:13 PM  Result Value Ref Range   Lactic Acid, Venous 0.8 0.5 - 1.9 mmol/L    Comment: Performed at [facility], [place] [address], [place], [place] [address]  Comprehensive metabolic panel     Status: None   Collection Time: 11/25/18  2:13 PM  Result Value Ref Range   Sodium 137 135 - 145 mmol/L   Potassium 4.1 3.5 - 5.1 mmol/L   Chloride 101 98 - 111 mmol/L   CO2 24 22 - 32 mmol/L   Glucose, Bld 93 70 - 99 mg/dL   BUN 9 6 - 20 mg/dL   Creatinine, Ser 0.74 0.61 - 1.24 mg/dL   Calcium 9.3 8.9 - 10.3 mg/dL   Total Protein 8.1 6.5 - 8.1 g/dL   Albumin 4.5 3.5 - 5.0 g/dL   AST 19 15 - 41 U/L   ALT 20 0 - 44 U/L   Alkaline Phosphatase 81 38 - 126 U/L   Total Bilirubin 1.2 0.3 - 1.2 mg/dL   GFR calc non Af Amer >60 >60 mL/min   GFR calc Af Amer >60 >60 mL/min   Anion gap  12 5 - 15    Comment: Performed at [facility], [place] [address], [place], [place] [address]  CBC with Differential     Status: Abnormal   Collection Time: 11/25/18  2:13 PM  Result Value Ref Range   WBC 11.7 (H) 4.0 - 10.5 K/uL   RBC 5.45 4.22 - 5.81 MIL/uL   Hemoglobin 16.5  13.0 - 17.0 g/dL   HCT 50.7 39.0 - 52.0 %   MCV 93.0 80.0 - 100.0 fL   MCH 30.3 26.0 - 34.0 pg   MCHC 32.5 30.0 - 36.0 g/dL   RDW 16.3 (H) 11.5 - 15.5 %   Platelets 283 150 - 400 K/uL   nRBC 0.0 0.0 - 0.2 %   Neutrophils Relative % 78 %   Neutro Abs 9.1 (H) 1.7 - 7.7 K/uL   Lymphocytes Relative 14 %   Lymphs Abs 1.6 0.7 - 4.0 K/uL   Monocytes Relative 8 %   Monocytes Absolute 0.9 0.1 - 1.0 K/uL   Eosinophils Relative 0 %   Eosinophils Absolute 0.0 0.0 - 0.5 K/uL   Basophils Relative 0 %   Basophils Absolute 0.1 0.0 - 0.1 K/uL   Immature Granulocytes 0 %   Abs Immature Granulocytes 0.05 0.00 - 0.07 K/uL    Comment: Performed at [facility], [place] [address] [place], [place] [address]   Dg Chest 2 View  Result Date: 11/25/2018 CLINICAL DATA:  Fever today.  History of smoking. EXAM: CHEST - 2 VIEW COMPARISON:  07/14/2018 FINDINGS: Cardiac silhouette is normal in size. No mediastinal or hilar masses. There is no evidence of adenopathy. Clear lungs.  No pleural effusion or pneumothorax. Skeletal structures are intact. IMPRESSION: No active cardiopulmonary disease. Electronically Signed   By: Lajean Manes M.D.   On: 11/25/2018 14:50   Ct Abdomen Pelvis W Contrast  Result Date: 11/25/2018 CLINICAL DATA:  Pain at drain site with erythema and low-grade fever. Drain placement last Friday. EXAM: CT ABDOMEN AND PELVIS WITH CONTRAST TECHNIQUE: Multidetector CT imaging of the abdomen and pelvis was performed using the standard protocol following bolus administration of intravenous contrast. CONTRAST:  125mL OMNIPAQUE IOHEXOL 300 MG/ML  SOLN COMPARISON:  11/17/2018 CT FINDINGS: Lower  chest: Bibasilar dependent atelectasis. The included heart size is within normal limits. Hepatobiliary: No focal liver abnormality is seen. No gallstones, gallbladder wall thickening, or biliary dilatation. Pancreas: Unremarkable. No pancreatic ductal dilatation or surrounding inflammatory changes. Spleen: Normal in size without focal abnormality. Adrenals/Urinary Tract: Mild nodularity of the posterior limbs of both adrenal glands possibly representing small benign adenomata, series 2/20 measuring up to 15 mm on the right and 12 mm on left. Symmetric cortical enhancement of both kidneys without mass, nephrolithiasis nor obstructive uropathy. The urinary bladder is unremarkable for the degree of distention. Stomach/Bowel: Nondistended stomach. Normal duodenal sweep and ligament of Treitz position. Bowel obstruction. Normal appendix. Nondistended large bowel with moderate stool retention. Low-density intramural abscess along lateral aspect of the descending colon measuring 2.7 x 1.6 x 6 cm, series 2/43 and series 6/144. Additionally, interval development pericolonic abscess in the left lower quadrant with air-fluid level partially traversed by percutaneous drainage catheter though the tip is situated more within the previously affected area of sigmoid colon. This is somewhat teardrop in shape greatest along its caudal aspect measuring 7 x 3 cm with overall craniocaudad length of 9 cm, series 2/62 and series 6/129. Vascular/Lymphatic: Calcified atheromatous aortoiliac plaquing without aneurysm or stenosis. No adenopathy. Reproductive: Mild prostatic enlargement with coarse central zone calcifications. Other: No free air nor free fluid. Musculoskeletal: No acute or significant osseous findings. IMPRESSION: 1. 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 2. Development of tracking intramural abscess  along the descending colon measuring 6 cm in  length by 2.7 x 1.6 cm. 3. Small hypodense nodules of the adrenal glands likely representing benign adenomata. 4. Aortoiliac atherosclerosis without aneurysm. 5. Percutaneous drainage catheter traverses the left lower quadrant abscess but terminates within the wall of the sigmoid colon. Electronically Signed   By: Ashley Royalty M.D.   On: 11/25/2018 17:02      Assessment/Plan Recurrent diverticulitis with abscess - drain placed 2/22 - s/p drain repositioning 3/20 - CT yesterday shows recurrent LLQ abscess and intramural abscess, percutaneous catheter traverses the LLQ abscess but terminates within wall of sigmoid colon - WBC 11.7 yesterday, Tmax 101, mildly tachycardic at times - no indication for emergent surgical intervention at this time, but ultimately patient will need non-urgent intervention - will ask IR to evaluate patient as well   FEN: NPO, IVF VTE: SCDs ID: [email or web address] 3/26>>  Repeat labs. Patient stable without peritonitis. Will ask IR to evaluate.   Brigid Re, [facility] Surgery 11/26/2018, 7:45 AM Pager: [phone] Consults: [phone]

## 2018-11-26 NOTE — Progress Notes (Signed)
Referring Physician(s): Kinsinger,L  Supervising Physician: Markus Daft  Patient Status:  [name] - In-pt  Chief Complaint:  Abdominal pain/abscess  Subjective: Pt well known to IR service from drainage of left abd abscesses; has hx diverticulitis and presented to [facility] ED yesterday with LLQ pain, fever; f/u CT revealed:  1. 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 2. Development of tracking intramural abscess along the descending colon measuring 6 cm in length by 2.7 x 1.6 cm. 3. Small hypodense nodules of the adrenal glands likely representing benign adenomata. 4. Aortoiliac atherosclerosis without aneurysm. 5. Percutaneous drainage catheter traverses the left lower quadrant abscess but terminates within the wall of the sigmoid colon  Request now received from [facility] for drain evaluation/manipulation; he currently denies HA, CP, dyspnea, cough,N/V or bleeding   History reviewed. No pertinent past medical history. Past Surgical History:  Procedure Laterality Date   IR CATHETER TUBE CHANGE  11/19/2018   IR RADIOLOGIST EVAL & MGMT  07/28/2018   IR RADIOLOGIST EVAL & MGMT  08/12/2018   IR RADIOLOGIST EVAL & MGMT  11/03/2018   IR RADIOLOGIST EVAL & MGMT  11/17/2018   IR SINUS/FIST TUBE CHK-NON GI  11/19/2018      Allergies: Patient has no known allergies.  Medications: Prior to Admission medications   Medication Sig Start Date End Date Taking? Authorizing Provider  acetaminophen (TYLENOL) 325 MG tablet Take 2 tablets (650 mg total) by mouth every 6 (six) hours as needed for mild pain (or temp > 100). 10/26/18  Yes Meuth, Brooke A, PA-C  levofloxacin (LEVAQUIN) 750 MG tablet Take 1 tablet (750 mg total) by mouth daily. Patient not taking: Reported on 11/25/2018 10/26/18   Margie Billet A, PA-C  polyethylene glycol (MIRALAX / GLYCOLAX) packet Take 17 g by  mouth daily. Patient not taking: Reported on 11/25/2018 10/26/18   Margie Billet A, PA-C  sodium chloride flush (NS) 0.9 % SOLN 3 mLs by Intracatheter route every 12 (twelve) hours. 10/26/18   Meuth, Blaine Hamper, PA-C     Vital Signs: BP 123/72 (BP Location: Right Arm)    Pulse 78    Temp 98.8 F (37.1 C) (Oral)    Resp 16    Ht 5\' 9"  (1.753 m)    Wt 163 lb 1 oz (74 kg)    SpO2 97%    BMI 24.08 kg/m   Physical Exam awake/alert; chest- CTA bilat; heart- RRR; abd- soft,few BS, LLQ drain intact with small amount feculent fluid in bag; site mild- mod tender to palpation, no leaking; no LE edema  Imaging: Dg Chest 2 View  Result Date: 11/25/2018 CLINICAL DATA:  Fever today.  History of smoking. EXAM: CHEST - 2 VIEW COMPARISON:  07/14/2018 FINDINGS: Cardiac silhouette is normal in size. No mediastinal or hilar masses. There is no evidence of adenopathy. Clear lungs.  No pleural effusion or pneumothorax. Skeletal structures are intact. IMPRESSION: No active cardiopulmonary disease. Electronically Signed   By: Lajean Manes M.D.   On: 11/25/2018 14:50   Ct Abdomen Pelvis W Contrast  Result Date: 11/25/2018 CLINICAL DATA:  Pain at drain site with erythema and low-grade fever. Drain placement last Friday. EXAM: CT ABDOMEN AND PELVIS WITH CONTRAST TECHNIQUE: Multidetector CT imaging of the abdomen and pelvis was performed using the standard protocol following bolus administration of intravenous contrast. CONTRAST:  120mL OMNIPAQUE IOHEXOL 300 MG/ML  SOLN COMPARISON:  11/17/2018 CT FINDINGS: Lower chest: Bibasilar dependent atelectasis. The included heart size is within normal limits. Hepatobiliary: No focal liver abnormality is seen. No gallstones, gallbladder wall thickening, or biliary dilatation. Pancreas: Unremarkable. No pancreatic ductal dilatation or surrounding inflammatory changes. Spleen: Normal in size without focal abnormality. Adrenals/Urinary Tract: Mild nodularity of the posterior limbs of both  adrenal glands possibly representing small benign adenomata, series 2/20 measuring up to 15 mm on the right and 12 mm on left. Symmetric cortical enhancement of both kidneys without mass, nephrolithiasis nor obstructive uropathy. The urinary bladder is unremarkable for the degree of distention. Stomach/Bowel: Nondistended stomach. Normal duodenal sweep and ligament of Treitz position. Bowel obstruction. Normal appendix. Nondistended large bowel with moderate stool retention. Low-density intramural abscess along lateral aspect of the descending colon measuring 2.7 x 1.6 x 6 cm, series 2/43 and series 6/144. Additionally, interval development pericolonic abscess in the left lower quadrant with air-fluid level partially traversed by percutaneous drainage catheter though the tip is situated more within the previously affected area of sigmoid colon. This is somewhat teardrop in shape greatest along its caudal aspect measuring 7 x 3 cm with overall craniocaudad length of 9 cm, series 2/62 and series 6/129. Vascular/Lymphatic: Calcified atheromatous aortoiliac plaquing without aneurysm or stenosis. No adenopathy. Reproductive: Mild prostatic enlargement with coarse central zone calcifications. Other: No free air nor free fluid. Musculoskeletal: No acute or significant osseous findings. IMPRESSION: 1. 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 2. Development of tracking intramural abscess along the descending colon measuring 6 cm in length by 2.7 x 1.6 cm. 3. Small hypodense nodules of the adrenal glands likely representing benign adenomata. 4. Aortoiliac atherosclerosis without aneurysm. 5. Percutaneous drainage catheter traverses the left lower quadrant abscess but terminates within the wall of the sigmoid colon. Electronically Signed   By: Ashley Royalty M.D.   On: 11/25/2018 17:02     Labs:  CBC: Recent Labs    10/23/18 0330 10/25/18 0414 11/25/18 1413 11/26/18 0830  WBC 8.3 6.1 11.7* 12.4*  HGB 12.5* 13.3 16.5 13.7  HCT 40.7 41.6 50.7 42.5  PLT 362 340 283 254    COAGS: Recent Labs    07/15/18 0227 08/13/18 0944 09/30/18 1141  INR 0.99 0.96 0.98    BMP: Recent Labs    10/22/18 1556 10/23/18 0330 11/25/18 1413 11/26/18 0830  NA 134* 133* 137 133*  K 3.7 3.8 4.1 3.6  CL 99 99 101 102  CO2 27 26 24 22   GLUCOSE 91 89 93 121*  BUN 6 6 9 6   CALCIUM 8.4* 8.5* 9.3 8.3*  CREATININE 0.74 0.82 0.74 0.80  GFRNONAA >60 >60 >60 >60  GFRAA >60 >60 >60 >60    LIVER FUNCTION TESTS: Recent Labs    07/10/18 1122 09/30/18 1141 10/22/18 1556 11/25/18 1413  BILITOT 0.7 0.8 0.8 1.2  AST 20 17 12* 19  ALT 20 9 14 20   ALKPHOS 50 57 60 81  PROT 6.5 6.7 6.4* 8.1  ALBUMIN 3.3* 2.9* 3.3* 4.5    Assessment and Plan: Pt with hx diverticular abscess and existing LLQ drain; presenting now with fever, LLQ pain, CT findings of recurrence of pericolonic abscess and development of tracking intramural abscess along the descending colon;  percutaneous drain traverses the left lower quadrant abscess but terminates within the wall of sigmoid colon.  Request now received  for drain evaluation/manipulation.  Imaging studies have been reviewed by Dr. Anselm Pancoast; plan is for drain injection with repositioning today.  Above discussed with patient with his understanding and consent.   Electronically Signed: D. Rowe Robert, PA-C 11/26/2018, 9:52 AM   I spent a total of 20 minutes at the the patient's bedside AND on the patient's hospital floor or unit, greater than 50% of which was counseling/coordinating care for abdominal abscess drain    Patient ID: Douglas Weber, male   DOB: 10/21/1970, 48 y.o.   MRN: [id number]

## 2018-11-26 NOTE — Procedures (Signed)
Interventional Radiology Procedure:   Indications: Recurrent diverticular abscess along existing drain tract  Procedure: Drain injection and placement of new drain with repositioning.    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  Drain pulled back into abscess and 40 ml of purulent fluid removed.    Complications: None     EBL: Less than 10 ml  Plan: Drain to gravity bag, send fluid for culture.    Douglas Weber R. Anselm Pancoast, MD  Pager: [phone]

## 2018-11-26 NOTE — Plan of Care (Signed)
  Problem: Clinical Measurements: Goal: Ability to maintain clinical measurements within normal limits will improve Outcome: Progressing Goal: Diagnostic test results will improve Outcome: Progressing   Problem: Nutrition: Goal: Adequate nutrition will be maintained Outcome: Progressing   Problem: Pain Managment: Goal: General experience of comfort will improve Outcome: Progressing   Problem: Safety: Goal: Ability to remain free from injury will improve Outcome: Progressing   Problem: Skin Integrity: Goal: Risk for impaired skin integrity will decrease Outcome: Progressing

## 2018-11-27 NOTE — Plan of Care (Signed)
  Problem: Clinical Measurements: Goal: Diagnostic test results will improve Outcome: Progressing   Problem: Activity: Goal: Risk for activity intolerance will decrease Outcome: Progressing   Problem: Nutrition: Goal: Adequate nutrition will be maintained Outcome: Progressing   Problem: Elimination: Goal: Will not experience complications related to bowel motility Outcome: Progressing   Problem: Pain Managment: Goal: General experience of comfort will improve Outcome: Progressing   Problem: Safety: Goal: Ability to remain free from injury will improve Outcome: Progressing   Problem: Skin Integrity: Goal: Risk for impaired skin integrity will decrease Outcome: Progressing

## 2018-11-27 NOTE — Progress Notes (Signed)
Subjective/Chief Complaint: Pt with less pain this AM    Objective: Vital signs in last 24 hours: Temp:  [98 F (36.7 C)-100.1 F (37.8 C)] 98 F (36.7 C) (03/28 0551) Pulse Rate:  [64-85] 64 (03/28 0551) Resp:  [20] 20 (03/28 0551) BP: (111-126)/(76-91) 114/76 (03/28 0551) SpO2:  [98 %-100 %] 100 % (03/28 0551) Last BM Date: 11/27/18  Intake/Output from previous day: 03/27 0701 - 03/28 0700 In: 1729 [I.V.:1687.5; IV Piggyback:36.6] Out: 1325 [Urine:1275; Drains:50] Intake/Output this shift: No intake/output data recorded.  Constitutional: No acute distress, conversant, appears states age. Eyes: Anicteric sclerae, moist conjunctiva, no lid lag Lungs: Clear to auscultation bilaterally, normal respiratory effort CV: regular rate and rhythm, no murmurs, no peripheral edema, pedal pulses 2+ GI: Soft, no masses or hepatosplenomegaly, non-tender to palpation, drain in place with SS output Skin: No rashes, palpation reveals normal turgor Psychiatric: appropriate judgment and insight, oriented to person, place, and time   Lab Results:  Recent Labs    11/25/18 1413 11/26/18 0830  WBC 11.7* 12.4*  HGB 16.5 13.7  HCT 50.7 42.5  PLT 283 254   BMET Recent Labs    11/25/18 1413 11/26/18 0830  NA 137 133*  K 4.1 3.6  CL 101 102  CO2 24 22  GLUCOSE 93 121*  BUN 9 6  CREATININE 0.74 0.80  CALCIUM 9.3 8.3*   PT/INR No results for input(s): LABPROT, INR in the last 72 hours. ABG No results for input(s): PHART, HCO3 in the last 72 hours.  Invalid input(s): PCO2, PO2  Studies/Results: Dg Chest 2 View  Result Date: 11/25/2018 CLINICAL DATA:  Fever today.  History of smoking. EXAM: CHEST - 2 VIEW COMPARISON:  07/14/2018 FINDINGS: Cardiac silhouette is normal in size. No mediastinal or hilar masses. There is no evidence of adenopathy. Clear lungs.  No pleural effusion or pneumothorax. Skeletal structures are intact. IMPRESSION: No active cardiopulmonary disease.  Electronically Signed   By: Lajean Manes M.D.   On: 11/25/2018 14:50   Ct Abdomen Pelvis W Contrast  Result Date: 11/25/2018 CLINICAL DATA:  Pain at drain site with erythema and low-grade fever. Drain placement last Friday. EXAM: CT ABDOMEN AND PELVIS WITH CONTRAST TECHNIQUE: Multidetector CT imaging of the abdomen and pelvis was performed using the standard protocol following bolus administration of intravenous contrast. CONTRAST:  123mL OMNIPAQUE IOHEXOL 300 MG/ML  SOLN COMPARISON:  11/17/2018 CT FINDINGS: Lower chest: Bibasilar dependent atelectasis. The included heart size is within normal limits. Hepatobiliary: No focal liver abnormality is seen. No gallstones, gallbladder wall thickening, or biliary dilatation. Pancreas: Unremarkable. No pancreatic ductal dilatation or surrounding inflammatory changes. Spleen: Normal in size without focal abnormality. Adrenals/Urinary Tract: Mild nodularity of the posterior limbs of both adrenal glands possibly representing small benign adenomata, series 2/20 measuring up to 15 mm on the right and 12 mm on left. Symmetric cortical enhancement of both kidneys without mass, nephrolithiasis nor obstructive uropathy. The urinary bladder is unremarkable for the degree of distention. Stomach/Bowel: Nondistended stomach. Normal duodenal sweep and ligament of Treitz position. Bowel obstruction. Normal appendix. Nondistended large bowel with moderate stool retention. Low-density intramural abscess along lateral aspect of the descending colon measuring 2.7 x 1.6 x 6 cm, series 2/43 and series 6/144. Additionally, interval development pericolonic abscess in the left lower quadrant with air-fluid level partially traversed by percutaneous drainage catheter though the tip is situated more within the previously affected area of sigmoid colon. This is somewhat teardrop in shape greatest along its caudal aspect  measuring 7 x 3 cm with overall craniocaudad length of 9 cm, series 2/62 and  series 6/129. Vascular/Lymphatic: Calcified atheromatous aortoiliac plaquing without aneurysm or stenosis. No adenopathy. Reproductive: Mild prostatic enlargement with coarse central zone calcifications. Other: No free air nor free fluid. Musculoskeletal: No acute or significant osseous findings. IMPRESSION: 1. 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 2. Development of tracking intramural abscess along the descending colon measuring 6 cm in length by 2.7 x 1.6 cm. 3. Small hypodense nodules of the adrenal glands likely representing benign adenomata. 4. Aortoiliac atherosclerosis without aneurysm. 5. Percutaneous drainage catheter traverses the left lower quadrant abscess but terminates within the wall of the sigmoid colon. Electronically Signed   By: Ashley Royalty M.D.   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 Recent repositioning of the drainage catheter on 11/19/2018. CT of the abdomen and pelvis from yesterday demonstrated recurrent abscess collection in the left lower abdomen along the drain tract. EXAM: DRAINAGE CATHETER INJECTION DRAIN EXCHANGE WITH REPOSITIONING MEDICATIONS: The patient is currently admitted to the hospital and receiving intravenous antibiotics. ANESTHESIA/SEDATION: Fentanyl 034 mcg COMPLICATIONS: None immediate. PROCEDURE: Informed written consent was obtained from the patient after a thorough discussion of the procedural risks, benefits and alternatives. All questions were addressed. Maximal Sterile Barrier Technique was utilized including caps, mask, sterile gowns, sterile gloves, sterile drape, hand hygiene and skin antiseptic. A timeout was performed prior to the initiation of the procedure. The left lower abdomen and drain were prepped and draped in sterile  fashion. Contrast was injected through the drain. The drain was cut and removed over a Bentson wire. A 5 French catheter was manipulated into the more cephalad abscess cavity. A new 10 French drain was placed. Approximately 40 mL of yellow purulent fluid was removed from the abscess cavity. Sample of fluid was sent for culture. Abscess cavity was injected with contrast and the contrast was aspirated at the end of the procedure. Catheter was flushed with saline and attached to a gravity bag. Catheter was sutured to skin and an adhesive device was also used to secure the catheter to the skin. FINDINGS: Old catheter was adjacent to the sigmoid colon and there was immediate filling of contrast into the sigmoid colon compatible with a persistent fistula. Drain was pulled back into the larger abscess cavity and 40 mL of purulent fluid was removed. There is a connection to the smaller cavity along the left upper lateral abdomen that was seen on the recent CT. Abscess cavity was successfully decompressed by the end of the procedure. IMPRESSION: Exchange and repositioning of the drainage catheter in the left lower quadrant abscess. The abscess cavity has been decompressed. Fluid sample was sent for culture. Catheter is attached to a gravity bag. Electronically Signed   By: Markus Daft M.D.   On: 11/26/2018 17:00    Anti-infectives: Anti-infectives (From admission, onward)   Start     Dose/Rate Route Frequency Ordered Stop   11/25/18 2200  piperacillin-tazobactam (ZOSYN) IVPB 3.375 g     3.375 g 12.5 mL/hr over 240 Minutes Intravenous Every 8 hours 11/25/18 2001     11/25/18 1400  piperacillin-tazobactam (ZOSYN) IVPB 3.375 g     3.375 g 100 mL/hr over 30 Minutes Intravenous  Once 11/25/18 1353 11/25/18 1457  Assessment/Plan: Recurrent diverticulitis with abscess -Drain repositioned yesterday -mobilize -Start PO -repeat CBC in AM  FEN: NPO, IVF VTE: SCDs ID: [id number] 3/26>>    LOS: 2 days     Ralene Ok 11/27/2018

## 2018-11-28 LAB — CBC
HCT: 45.6 % (ref 39.0–52.0)
Hemoglobin: 15 g/dL (ref 13.0–17.0)
MCH: 29.9 pg (ref 26.0–34.0)
MCHC: 32.9 g/dL (ref 30.0–36.0)
MCV: 91 fL (ref 80.0–100.0)
Platelets: 324 10*3/uL (ref 150–400)
RBC: 5.01 MIL/uL (ref 4.22–5.81)
RDW: 14.7 % (ref 11.5–15.5)
WBC: 6 10*3/uL (ref 4.0–10.5)
nRBC: 0 % (ref 0.0–0.2)

## 2018-11-28 MED ORDER — CIPROFLOXACIN HCL 500 MG PO TABS: 500.0000 mg | ORAL_TABLET | Freq: Two times a day (BID) | ORAL | 0 refills | Status: AC

## 2018-11-28 MED ORDER — METRONIDAZOLE 500 MG PO TABS: 500.0000 mg | ORAL_TABLET | Freq: Three times a day (TID) | ORAL | 0 refills | Status: AC

## 2018-11-28 NOTE — Discharge Instructions (Signed)
Percutaneous Abscess Drain, Care After  This sheet gives you information about how to care for yourself after your procedure. Your health care provider may also give you more specific instructions. If you have problems or questions, contact your health care provider.  What can I expect after the procedure?  After your procedure, it is common to have:  · A small amount of bruising and discomfort in the area where the drainage tube (catheter) was placed.  · Sleepiness and fatigue. This should go away after the medicines you were given have worn off.  Follow these instructions at home:  Incision care  · Follow instructions from your health care provider about how to take care of your incision. Make sure you:  ? Wash your hands with soap and water before you change your bandage (dressing). If soap and water are not available, use hand sanitizer.  ? Change your dressing as told by your health care provider.  ? Leave stitches (sutures), skin glue, or adhesive strips in place. These skin closures may need to stay in place for 2 weeks or longer. If adhesive strip edges start to loosen and curl up, you may trim the loose edges. Do not remove adhesive strips completely unless your health care provider tells you to do that.  · Check your incision area every day for signs of infection. Check for:  ? More redness, swelling, or pain.  ? More fluid or blood.  ? Warmth.  ? Pus or a bad smell.  ? Fluid leaking from around your catheter (instead of fluid draining through your catheter).  Catheter care    · Follow instructions from your health care provider about emptying and cleaning your catheter and collection bag. You may need to clean the catheter every day so it does not clog.  · If directed, write down the following information every time you empty your bag:  ? The date and time.  ? The amount of drainage.  General instructions  · Rest at home for 1-2 days after your procedure. Return to your normal activities as told by your  health care provider.  · Do not take baths, swim, or use a hot tub for 24 hours after your procedure, or until your health care provider says that this is okay.  · Take over-the-counter and prescription medicines only as told by your health care provider.  · Keep all follow-up visits as told by your health care provider. This is important.  Contact a health care provider if:  · You have less than 10 mL of drainage a day for 2-3 days in a row, or as directed by your health care provider.  · You have more redness, swelling, or pain around your incision area.  · You have more fluid or blood coming from your incision area.  · Your incision area feels warm to the touch.  · You have pus or a bad smell coming from your incision area.  · You have fluid leaking from around your catheter (instead of through your catheter).  · You have a fever or chills.  · You have pain that does not get better with medicine.  Get help right away if:  · Your catheter comes out.  · You suddenly stop having drainage from your catheter.  · You suddenly have blood in the fluid that is draining from your catheter.  · You become dizzy or you faint.  · You develop a rash.  · You have nausea or vomiting.  ·   You have difficulty breathing or you feel short of breath.  · You develop chest pain.  · You have problems with your speech or vision.  · You have trouble balancing or moving your arms or legs.  Summary  · It is common to have a small amount of bruising and discomfort in the area where the drainage tube (catheter) was placed.  · You may be directed to record the amount of drainage from the bag every time you empty it.  · Follow instructions from your health care provider about emptying and cleaning your catheter and collection bag.  This information is not intended to replace advice given to you by your health care provider. Make sure you discuss any questions you have with your health care provider.  Document Released: 01/02/2014 Document  Revised: 07/10/2016 Document Reviewed: 07/10/2016  [employer] Interactive Patient Education © 2019 [employer]

## 2018-11-28 NOTE — Plan of Care (Signed)
  Problem: Education: Goal: Knowledge of General Education information will improve Description Including pain rating scale, medication(s)/side effects and non-pharmacologic comfort measures 11/28/2018 1051 by Phyllis Ginger, RN Outcome: Completed/Met 11/28/2018 1048 by Phyllis Ginger, RN Outcome: Adequate for Discharge   Problem: Health Behavior/Discharge Planning: Goal: Ability to manage health-related needs will improve 11/28/2018 1051 by Phyllis Ginger, RN Outcome: Completed/Met 11/28/2018 1048 by Phyllis Ginger, RN Outcome: Adequate for Discharge   Problem: Clinical Measurements: Goal: Ability to maintain clinical measurements within normal limits will improve 11/28/2018 1051 by Phyllis Ginger, RN Outcome: Completed/Met 11/28/2018 1048 by Phyllis Ginger, RN Outcome: Adequate for Discharge Goal: Will remain free from infection 11/28/2018 1051 by Phyllis Ginger, RN Outcome: Completed/Met 11/28/2018 1048 by Phyllis Ginger, RN Outcome: Adequate for Discharge Goal: Diagnostic test results will improve 11/28/2018 1051 by Phyllis Ginger, RN Outcome: Completed/Met 11/28/2018 1048 by Phyllis Ginger, RN Outcome: Adequate for Discharge Goal: Respiratory complications will improve 11/28/2018 1051 by Phyllis Ginger, RN Outcome: Completed/Met 11/28/2018 1048 by Phyllis Ginger, RN Outcome: Adequate for Discharge Goal: Cardiovascular complication will be avoided 11/28/2018 1051 by Phyllis Ginger, RN Outcome: Completed/Met 11/28/2018 1048 by Phyllis Ginger, RN Outcome: Adequate for Discharge   Problem: Activity: Goal: Risk for activity intolerance will decrease 11/28/2018 1051 by Phyllis Ginger, RN Outcome: Completed/Met 11/28/2018 1048 by Phyllis Ginger, RN Outcome: Adequate for Discharge   Problem: Nutrition: Goal: Adequate nutrition will be maintained 11/28/2018 1051 by Phyllis Ginger, RN Outcome: Completed/Met 11/28/2018 1048 by Phyllis Ginger, RN Outcome: Adequate for  Discharge   Problem: Coping: Goal: Level of anxiety will decrease 11/28/2018 1051 by Phyllis Ginger, RN Outcome: Completed/Met 11/28/2018 1048 by Phyllis Ginger, RN Outcome: Adequate for Discharge   Problem: Elimination: Goal: Will not experience complications related to bowel motility 11/28/2018 1051 by Phyllis Ginger, RN Outcome: Completed/Met 11/28/2018 1048 by Phyllis Ginger, RN Outcome: Adequate for Discharge Goal: Will not experience complications related to urinary retention 11/28/2018 1051 by Phyllis Ginger, RN Outcome: Completed/Met 11/28/2018 1048 by Phyllis Ginger, RN Outcome: Adequate for Discharge   Problem: Pain Managment: Goal: General experience of comfort will improve 11/28/2018 1051 by Phyllis Ginger, RN Outcome: Completed/Met 11/28/2018 1048 by Phyllis Ginger, RN Outcome: Adequate for Discharge   Problem: Safety: Goal: Ability to remain free from injury will improve 11/28/2018 1051 by Phyllis Ginger, RN Outcome: Completed/Met 11/28/2018 1048 by Phyllis Ginger, RN Outcome: Adequate for Discharge   Problem: Skin Integrity: Goal: Risk for impaired skin integrity will decrease 11/28/2018 1051 by Phyllis Ginger, RN Outcome: Completed/Met 11/28/2018 1048 by Phyllis Ginger, RN Outcome: Adequate for Discharge

## 2018-11-28 NOTE — Discharge Summary (Signed)
Physician Discharge Summary  Patient ID: Douglas Weber MRN: [id number] DOB/AGE: Nov 12, 1970 48 y.o.  Admit date: 11/25/2018 Discharge date: 11/28/2018  Admission Diagnoses: Diverticular abscess  Discharge Diagnoses:  Active Problems:   Diverticulitis of large intestine with perforation and abscess   Discharged Condition: good  Hospital Course: Patient is a 48 year old male who was admitted secondary to pain.  Patient had a history of former history of diverticulitis and colostomy.  Patient underwent CT scan of diverticular abscess.  Patient underwent IR repositioning of the drain patient had the abscess cavity.  Patient was placed on Zosyn.  Patient's white count returned to normal.  Patient is eating a regular diet and tolerating well without any abdominal pain.  Pt was afebrile and deemed stable for DC and DC 'd home;.  Consults: IR  Significant Diagnostic Studies: CT with diverticular abscess  Treatments: abx and IR repositioning of drain  Discharge Exam: Blood pressure 122/82, pulse 60, temperature (!) 97.5 F (36.4 C), temperature source Oral, resp. rate 18, height 5\' 9"  (1.753 m), weight 74 kg, SpO2 100 %. General appearance: alert and cooperative GI: soft, non-tender; bowel sounds normal; no masses,  no organomegaly  Disposition: Discharge disposition: 01-Home or Self Care       Discharge Instructions    Diet - low sodium heart healthy   Complete by:  As directed    Increase activity slowly   Complete by:  As directed      Allergies as of 11/28/2018   No Known Allergies     Medication List    STOP taking these medications   levofloxacin 750 MG tablet Commonly known as:  LEVAQUIN     TAKE these medications   acetaminophen 325 MG tablet Commonly known as:  TYLENOL Take 2 tablets (650 mg total) by mouth every 6 (six) hours as needed for mild pain (or temp > 100).   ciprofloxacin 500 MG tablet Commonly known as:  Cipro Take 1 tablet (500 mg total) by  mouth 2 (two) times daily for 10 days.   metroNIDAZOLE 500 MG tablet Commonly known as:  Flagyl Take 1 tablet (500 mg total) by mouth 3 (three) times daily for 14 days.   polyethylene glycol packet Commonly known as:  MIRALAX / GLYCOLAX Take 17 g by mouth daily.   sodium chloride flush 0.9 % Soln Commonly known as:  NS 3 mLs by Intracatheter route every 12 (twelve) hours.      Follow-up Information    Leighton Ruff, MD. Call.   Specialty:  General Surgery Contact information: [place] [address] [phone]           Signed: Ralene Ok 11/28/2018, 9:14 AM

## 2018-11-28 NOTE — Progress Notes (Signed)
Discharge instructions and new medications reviewed with patient.  All questions answered.  IV removed intact.  Ambulated with staff to exit and released to family.

## 2018-11-28 NOTE — Plan of Care (Signed)

## 2018-11-30 LAB — CULTURE, BLOOD (ROUTINE X 2)
Culture: NO GROWTH
Culture: NO GROWTH
Special Requests: ADEQUATE
Special Requests: ADEQUATE

## 2018-12-01 LAB — AEROBIC/ANAEROBIC CULTURE W GRAM STAIN (SURGICAL/DEEP WOUND)

## 2018-12-01 LAB — AEROBIC/ANAEROBIC CULTURE (SURGICAL/DEEP WOUND)

## 2019-01-04 ENCOUNTER — Ambulatory Visit: Payer: Self-pay | Admitting: General Surgery

## 2019-01-04 NOTE — H&P (Signed)
History of Present Illness Douglas Ruff MD; 11/02/1959 11:20 AM) The patient is a 48 year old male who presents with diverticulitis. Past history of perforated diverticulitis with left lower quadrant abscess, treated with a drain on 07/15/18. His primary abscess cavity completely resolved so the drain was removed on 08/12/18, but there was a loculated fluid collection along the left upper quadrant in the perisplenic region so this fluid collection was aspirated growing bacteroides fragilis. He was placed on Augmentin.   On 09/11/18, he presented to the ER with back pain. CT scan revealed renal stone and recurrent left upper quadrant fluid collection. A drain was placed. He was then discharged on stable condition. He had been doing well at home until he underwent a drain exchange in radiology. He then developed pain around his drain site difficulty walking and a temperature to 101.5. Patient was rehospitalized and the drain was repositioned by interventional radiology. He was then discharged 2 days later in stable condition. He has been at home on oral antibiotics since then. He has had no further issues. He is tolerating a diet and having regular bowel movements with the assistance of MiraLAX. He has gained some weight back.     Problem List/Past Medical Douglas Ruff, MD; 10/03/9796 11:20 AM) PAIN IN THE ABDOMEN (R10.9) ABSCESS (L02.91) DIVERTICULITIS OF LARGE INTESTINE WITH COMPLICATION (X21.19)  Past Surgical History Douglas Ruff, MD; 12/01/7406 11:20 AM) Tonsillectomy  Diagnostic Studies History Douglas Ruff, MD; 09/04/4816 11:20 AM) Colonoscopy never  Allergies (Tanisha A. Owens Shark, [place]; 01/04/2019 11:03 AM) No Known Drug Allergies [08/11/2018]: Allergies Reconciled  Medication History Douglas Ruff, MD; 01/05/3148 11:20 AM) Medications Reconciled Neomycin Sulfate (500MG  Tablet, 2 (two) Oral SEE NOTE, Taken starting 01/04/2019) Active. (TAKE TWO TABLETS AT 2 PM, 3 PM,  AND 10 PM THE DAY PRIOR TO SURGERY) Flagyl (500MG  Tablet, 2 (two) Oral SEE NOTE, Taken starting 01/04/2019) Active. (Take at 2pm, 3pm, and 10pm the day prior to your colon operation)  Social History Douglas Ruff, MD; 7/0/2637 11:20 AM) Alcohol use Occasional alcohol use. Caffeine use Carbonated beverages, Tea. No drug use Tobacco use Former smoker.    Vitals (Tanisha A. Brown RMA; 01/04/2019 11:03 AM) 01/04/2019 11:03 AM Weight: 166.4 lb Height: 68in Body Surface Area: 1.89 m Body Mass Index: 25.3 kg/m  Temp.: 98.32F  Pulse: 94 (Regular)  BP: 118/78 (Sitting, Left Arm, Standard)      Physical Exam Douglas Ruff MD; 04/05/8849 11:39 AM)  The physical exam findings are as follows: Note:Gen: alert and well appearing Eye: extraocular motion intact, no scleral icterus ENT: moist mucus membranes, dentition intact Neck: no mass or thyromegaly Chest: unlabored respirations, symmetrical air entry, clear bilaterally CV: regular rate and rhythm, no pedal edema Abdomen: soft, nontender, nondistended. No mass or organomegaly; left lower quadrant drain with thin light brown output, some thicker residue at the proximal portion of the tube MSK: strength symmetrical throughout, no deformity Neuro: grossly intact, normal gait Psych: normal mood and affect, appropriate insight Skin: warm and dry, no rash or lesion on limited exam    Assessment & Plan Douglas Ruff MD; 10/08/7410 11:20 AM)  DIVERTICULITIS OF LARGE INTESTINE WITH COMPLICATION (I78.67) Impression: 48 year old male who presents to the office for follow-up of his diverticular disease. Patient does have a remaining CT-guided drain. This is unable to be removed. I have recommended partial colectomy. Patient will need to undergo colonoscopy prior to surgery. This can be performed the day prior to surgery and avoid 2 bowel preps if needed. The surgery  and anatomy were described to the patient as well as the risks of  surgery and the possible complications. These include: Bleeding, deep abdominal infections and possible wound complications such as hernia and infection, damage to adjacent structures, leak of surgical connections, which can lead to other surgeries and possibly an ostomy, possible need for other procedures, such as abscess drains in radiology, possible prolonged hospital stay, possible diarrhea from removal of part of the colon, possible constipation from narcotics, possible bowel, bladder or sexual dysfunction if having rectal surgery, prolonged fatigue/weakness or appetite loss, possible early recurrence of of disease, possible complications of their medical problems such as heart disease or arrhythmias or lung problems, death (less than 1%). I believe the patient understands and wishes to proceed with the surgery.

## 2019-01-19 ENCOUNTER — Telehealth: Payer: Self-pay | Admitting: Gastroenterology

## 2019-01-19 NOTE — Telephone Encounter (Signed)
ERROR

## 2019-01-19 NOTE — Telephone Encounter (Signed)
Pt is needing a colonoscopy done on 03/15/2019 before schedule surgery. Pt is under 51 tried to sched virtual offc visit to discuss colon with dr.danis however there were not opening dates to get pt sched. So that colon can be schedule on date if could be. Please advise of scheduling thanks. Pt will like a call .

## 2019-01-19 NOTE — Telephone Encounter (Signed)
Dr. Loletha Carrow, this patient had a colonoscopy scheduled on 09/15/18 but cancelled due to a kidney stone. He is currently scheduled for a robotic assisted laparoscopic partial colectomy on 03/16/19 and wants to have his colonoscopy prior to his surgery. This RN temporarily placed him on your schedule for a virtual visit your next available appointment which is 02/01/2019 at 3:30 pm. Do you want this patient to be seen in the office in person or will a virtual visit suffice? Please advise.

## 2019-01-20 ENCOUNTER — Encounter: Payer: Self-pay | Admitting: *Deleted

## 2019-01-20 NOTE — Telephone Encounter (Signed)
Letter as an appointment reminder mailed to the patient.

## 2019-01-20 NOTE — Telephone Encounter (Signed)
Left message to patient to call back  

## 2019-01-20 NOTE — Telephone Encounter (Signed)
Telemedicine visit on 6/2 is fine.  That will give me time to communicate with his surgeon about all this.

## 2019-01-20 NOTE — Telephone Encounter (Signed)
Pt returned your call and was informed about virtual visit.

## 2019-01-20 NOTE — Telephone Encounter (Signed)
Spoke with the patient, notified him of appt for virtual visit and letter that will come in the mail.

## 2019-01-31 ENCOUNTER — Other Ambulatory Visit: Payer: Self-pay

## 2019-02-01 ENCOUNTER — Encounter: Payer: Self-pay | Admitting: Gastroenterology

## 2019-02-01 ENCOUNTER — Other Ambulatory Visit: Payer: Self-pay

## 2019-02-01 ENCOUNTER — Ambulatory Visit (INDEPENDENT_AMBULATORY_CARE_PROVIDER_SITE_OTHER): Payer: BLUE CROSS/BLUE SHIELD | Admitting: Gastroenterology

## 2019-02-01 VITALS — Ht 68.0 in | Wt 160.0 lb

## 2019-02-01 DIAGNOSIS — K5792 Diverticulitis of intestine, part unspecified, without perforation or abscess without bleeding: Secondary | ICD-10-CM

## 2019-02-01 NOTE — Progress Notes (Signed)
This patient contacted our office requesting a physician telemedicine consultation regarding clinical questions and/or test results.  Participants on the conference : myself and patient and his wife  The patient consented to this consultation and was aware that a charge will be placed through their insurance.  I was in my office and the patient was at home   Encounter time:  Total time 22 minutes, with 17 minutes spent with patient on Doximity   _____________________________________________________________________________________________               Douglas Weber GI Progress Note  Chief Complaint: Diverticulitis  Subjective  History: I saw Douglas Weber for consultation in January of this year after a complicated course of diverticulitis requiring hospitalization and drain placement last November.  At that time, the plan was for elective sigmoid resection, and the surgical service requested a colonoscopy prior to that.  Douglas Weber was unable to have that done because he developed a kidney stone shortly after seeing me.  He was then hospitalized again for diverticulitis in February, and then a worse episode in March requiring surgical consultation and drain placement.  He was referred back to [place] to discuss timing of colonoscopy, because Dr. [name] is planning to do an elective sigmoid resection on July 15.  There were tentative plans to do it sooner, but it was delayed due to COVID restrictions.   I recently discussed his clinical course and plan with Dr. [name].   Douglas Weber is doing fairly well overall.  He has some irritation at the drain site in the mid abdomen.  There was some leaking from the collection bag earlier this week, but he was able to get the bag changed today at interventional radiology. Bowels are regular without rectal bleeding.  His appetite is been variable.  His weight has finally stabilized.  ROS: Cardiovascular:  no chest pain Respiratory: no dyspnea  The patient's Past Medical,  Family and Social History were reviewed and are on file in the EMR.  Objective:  Med list reviewed  Current Outpatient Medications:  .  acetaminophen (TYLENOL) 325 MG tablet, Take 2 tablets (650 mg total) by mouth every 6 (six) hours as needed for mild pain (or temp > 100)., Disp: , Rfl:  .  polyethylene glycol (MIRALAX / GLYCOLAX) packet, Take 17 g by mouth daily. (Patient not taking: Reported on 11/25/2018), Disp: 14 each, Rfl: 0   No exam-virtual visit. He is well-appearing and in good spirits. Recent Labs:  CBC Latest Ref Rng & Units 11/28/2018 11/26/2018 11/25/2018  WBC 4.0 - 10.5 K/uL 6.0 12.4(H) 11.7(H)  Hemoglobin 13.0 - 17.0 g/dL 15.0 13.7 16.5  Hematocrit 39.0 - 52.0 % 45.6 42.5 50.7  Platelets 150 - 400 K/uL 324 254 283   CMP Latest Ref Rng & Units 11/26/2018 11/25/2018 10/23/2018  Glucose 70 - 99 mg/dL 121(H) 93 89  BUN 6 - 20 mg/dL 6 9 6   Creatinine 0.61 - 1.24 mg/dL 0.80 0.74 0.82  Sodium 135 - 145 mmol/L 133(L) 137 133(L)  Potassium 3.5 - 5.1 mmol/L 3.6 4.1 3.8  Chloride 98 - 111 mmol/L 102 101 99  CO2 22 - 32 mmol/L 22 24 26   Calcium 8.9 - 10.3 mg/dL 8.3(L) 9.3 8.5(L)  Total Protein 6.5 - 8.1 g/dL - 8.1 -  Total Bilirubin 0.3 - 1.2 mg/dL - 1.2 -  Alkaline Phos 38 - 126 U/L - 81 -  AST 15 - 41 U/L - 19 -  ALT 0 - 44 U/L - 20 -  Radiologic studies:  CT abdomen and pelvis 11/25/2018:  CLINICAL DATA:  Pain at drain site with erythema and low-grade fever. Drain placement last Friday.   EXAM: CT ABDOMEN AND PELVIS WITH CONTRAST   TECHNIQUE: Multidetector CT imaging of the abdomen and pelvis was performed using the standard protocol following bolus administration of intravenous contrast.   CONTRAST:  16mL OMNIPAQUE IOHEXOL 300 MG/ML  SOLN   COMPARISON:  11/17/2018 CT   FINDINGS: Lower chest: Bibasilar dependent atelectasis. The included heart size is within normal limits.   Hepatobiliary: No focal liver abnormality is seen. No gallstones, gallbladder  wall thickening, or biliary dilatation.   Pancreas: Unremarkable. No pancreatic ductal dilatation or surrounding inflammatory changes.   Spleen: Normal in size without focal abnormality.   Adrenals/Urinary Tract: Mild nodularity of the posterior limbs of both adrenal glands possibly representing small benign adenomata, series 2/20 measuring up to 15 mm on the right and 12 mm on left. Symmetric cortical enhancement of both kidneys without mass, nephrolithiasis nor obstructive uropathy. The urinary bladder is unremarkable for the degree of distention.   Stomach/Bowel: Nondistended stomach. Normal duodenal sweep and ligament of Treitz position. Bowel obstruction. Normal appendix. Nondistended large bowel with moderate stool retention. Low-density intramural abscess along lateral aspect of the descending colon measuring 2.7 x 1.6 x 6 cm, series 2/43 and series 6/144. Additionally, interval development pericolonic abscess in the left lower quadrant with air-fluid level partially traversed by percutaneous drainage catheter though the tip is situated more within the previously affected area of sigmoid colon. This is somewhat teardrop in shape greatest along its caudal aspect measuring 7 x 3 cm with overall craniocaudad length of 9 cm, series 2/62 and series 6/129.   Vascular/Lymphatic: Calcified atheromatous aortoiliac plaquing without aneurysm or stenosis. No adenopathy.   Reproductive: Mild prostatic enlargement with coarse central zone calcifications.   Other: No free air nor free fluid.   Musculoskeletal: No acute or significant osseous findings.   IMPRESSION: 1. 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 2. Development of tracking intramural abscess along the descending colon measuring 6 cm in length by 2.7 x 1.6 cm. 3. Small hypodense nodules of the  adrenal glands likely representing benign adenomata. 4. Aortoiliac atherosclerosis without aneurysm. 5. Percutaneous drainage catheter traverses the left lower quadrant abscess but terminates within the wall of the sigmoid colon.     Electronically Signed   By: Ashley Royalty M.D.   On: 11/25/2018 17:02   @ASSESSMENTPLANBEGIN @ Assessment: Encounter Diagnosis  Name Primary?  . Acute diverticulitis Yes   He still needs a colonoscopy before surgery was performed.  It would be ideal to perform it the day prior to surgery so he does not have to take 2 bowel preparations.  He was agreeable to this.  Once our July schedule is available soon, we will contact him to coordinate it.  However, it will need to be with another of my partners since I am out of town that day.  Douglas Weber was agreeable to that arrangement, since it would be convenient for him.  Nelida Meuse III

## 2019-02-02 ENCOUNTER — Other Ambulatory Visit: Payer: Self-pay

## 2019-02-02 DIAGNOSIS — R1032 Left lower quadrant pain: Secondary | ICD-10-CM

## 2019-02-02 DIAGNOSIS — K5792 Diverticulitis of intestine, part unspecified, without perforation or abscess without bleeding: Secondary | ICD-10-CM

## 2019-02-02 MED ORDER — NA SULFATE-K SULFATE-MG SULF 17.5-3.13-1.6 GM/177ML PO SOLN: 1.0000 | Freq: Once | ORAL | 0 refills | Status: AC

## 2019-03-09 NOTE — Patient Instructions (Addendum)
YOU NEED TO HAVE A COVID 19 TEST ON Saturday, July 11th @1045 , THIS TEST MUST BE DONE BEFORE SURGERY, COME TO [facility] ENTRANCE. ONCE YOUR COVID TEST IS COMPLETED, PLEASE BEGIN THE QUARANTINE INSTRUCTIONS AS OUTLINED IN YOUR HANDOUT.                Douglas Weber   Your procedure is scheduled on: 03-16-2019  Report to [facility]    Report to admitting at 6:30  AM      Call this number if you have problems the morning of surgery [phone]    Remember:  [place], NO [place].   NO FOOD AFTER MIDNIGHT Monday NIGHT, CLEAR LIQUIDS ALL DAY Tuesday, FOLLOW ALL BOWEL PREP INSTRUCTIONS FROM DR [name]. DRINK PLENTY OF WATER WITH BOWEL PREP TO PREVENT DEHYDRATION.   DRINK 2 PRESURGERY ENSURE DRINKS THE NIGHT BEFORE SURGERY AT 10:00 PM AND 1 PRESURGERY DRINK THE DAY OF THE PROCEDURE 3 HOURS PRIOR TO SCHEDULED SURGERY. NO SOLIDS AFTER MIDNIGHT THE DAY PRIOR TO THE SURGERY. NOTHING BY MOUTH EXCEPT CLEAR LIQUIDS UNTIL THREE HOURS PRIOR TO SCHEDULED SURGERY. PLEASE FINISH PRESURGERY ENSURE DRINK PER SURGEON ORDER 3 HOURS PRIOR TO SCHEDULED SURGERY TIME WHICH NEEDS TO BE COMPLETED AT 5:30 AM.    CLEAR LIQUID DIET   Foods Allowed                                                                     Foods Excluded  Coffee and tea, regular and decaf                             liquids that you cannot  Plain Jell-O in any flavor                                             see through such as: Fruit ices (not with fruit pulp)                                     milk, soups, orange juice  Iced Popsicles                                    All solid food Carbonated beverages, regular and diet                                    Cranberry, grape and apple juices Sports drinks like Gatorade Lightly seasoned clear broth or consume(fat free) Sugar, honey syrup  Sample  Menu Breakfast                                Lunch  Supper Cranberry juice                    Beef broth                            Chicken broth Jell-O                                     Grape juice                           Apple juice Coffee or tea                        Jell-O                                      Popsicle                                                Coffee or tea                        Coffee or tea  _____________________________________________________________________     Take these medicines the morning of surgery with A SIP OF WATER: NONE                                You may not have any metal on your body including hair pins and              piercings  Do not wear jewelry, make-up, lotions, powders or perfumes, deodorant             Do not wear nail polish.  Do not shave  48 hours prior to surgery.              Men may shave face and neck.   Do not bring valuables to the hospital. [place].  Contacts, dentures or bridgework may not be worn into surgery.  Leave suitcase in the car. After surgery it may be brought to your room.     _____________________________________________________________________             [facility] - Preparing for Surgery Before surgery, you can play an important role.  Because skin is not sterile, your skin needs to be as free of germs as possible.  You can reduce the number of germs on your skin by washing with CHG (chlorahexidine gluconate) soap before surgery.  CHG is an antiseptic cleaner which kills germs and bonds with the skin to continue killing germs even after washing. Please DO NOT use if you have an allergy to CHG or antibacterial soaps.  If your skin becomes reddened/irritated stop using the CHG and inform your nurse when you arrive at Short Stay. Do not shave (including legs and underarms) for at least 48 hours prior to the first  CHG shower.  You may shave your face/neck. Please  follow these instructions carefully:  1.  Shower with CHG Soap the night before surgery and the  morning of Surgery.  2.  If you choose to wash your hair, wash your hair first as usual with your  normal  shampoo.  3.  After you shampoo, rinse your hair and body thoroughly to remove the  shampoo.                           4.  Use CHG as you would any other liquid soap.  You can apply chg directly  to the skin and wash                       Gently with a scrungie or clean washcloth.  5.  Apply the CHG Soap to your body ONLY FROM THE NECK DOWN.   Do not use on face/ open                           Wound or open sores. Avoid contact with eyes, ears mouth and genitals (private parts).                       Wash face,  Genitals (private parts) with your normal soap.             6.  Wash thoroughly, paying special attention to the area where your surgery  will be performed.  7.  Thoroughly rinse your body with warm water from the neck down.  8.  DO NOT shower/wash with your normal soap after using and rinsing off  the CHG Soap.                9.  Pat yourself dry with a clean towel.            10.  Wear clean pajamas.            11.  Place clean sheets on your bed the night of your first shower and do not  sleep with pets. Day of Surgery : Do not apply any lotions/deodorants the morning of surgery.  Please wear clean clothes to the hospital/surgery center.  FAILURE TO FOLLOW THESE INSTRUCTIONS MAY RESULT IN THE CANCELLATION OF YOUR SURGERY PATIENT SIGNATURE_________________________________  NURSE SIGNATURE__________________________________  ________________________________________________________________________   [name]  An incentive spirometer is a tool that can help keep your lungs clear and active. This tool measures how well you are filling your lungs with each breath. Taking long deep breaths may help reverse or decrease the  chance of developing breathing (pulmonary) problems (especially infection) following:  A long period of time when you are unable to move or be active. BEFORE THE PROCEDURE   If the spirometer includes an indicator to show your best effort, your nurse or respiratory therapist will set it to a desired goal.  If possible, sit up straight or lean slightly forward. Try not to slouch.  Hold the incentive spirometer in an upright position. INSTRUCTIONS FOR USE  1. Sit on the edge of your bed if possible, or sit up as far as you can in bed or on a chair. 2. Hold the incentive spirometer in an upright position. 3. Breathe out normally. 4. Place the mouthpiece in your mouth and seal your lips tightly around it. 5. Breathe in slowly and as deeply as possible, raising the piston or the ball toward  the top of the column. 6. Hold your breath for 3-5 seconds or for as long as possible. Allow the piston or ball to fall to the bottom of the column. 7. Remove the mouthpiece from your mouth and breathe out normally. 8. Rest for a few seconds and repeat Steps 1 through 7 at least 10 times every 1-2 hours when you are awake. Take your time and take a few normal breaths between deep breaths. 9. The spirometer may include an indicator to show your best effort. Use the indicator as a goal to work toward during each repetition. 10. After each set of 10 deep breaths, practice coughing to be sure your lungs are clear. If you have an incision (the cut made at the time of surgery), support your incision when coughing by placing a pillow or rolled up towels firmly against it. Once you are able to get out of bed, walk around indoors and cough well. You may stop using the incentive spirometer when instructed by your caregiver.  RISKS AND COMPLICATIONS  Take your time so you do not get dizzy or light-headed.  If you are in pain, you may need to take or ask for pain medication before doing incentive spirometry. It is harder  to take a deep breath if you are having pain. AFTER USE  Rest and breathe slowly and easily.  It can be helpful to keep track of a log of your progress. Your caregiver can provide you with a simple table to help with this. If you are using the spirometer at home, follow these instructions: [place] IF:   You are having difficultly using the spirometer.  You have trouble using the spirometer as often as instructed.  Your pain medication is not giving enough relief while using the spirometer.  You develop fever of 100.5 F (38.1 C) or higher. SEEK IMMEDIATE MEDICAL CARE IF:   You cough up bloody sputum that had not been present before.  You develop fever of 102 F (38.9 C) or greater.  You develop worsening pain at or near the incision site. MAKE SURE YOU:   Understand these instructions.  Will watch your condition.  Will get help right away if you are not doing well or get worse. Document Released: 12/29/2006 Document Revised: 11/10/2011 Document Reviewed: 03/01/2007 ExitCare Patient Information 2014 [employer], [place].   ________________________________________________________________________  WHAT IS A BLOOD TRANSFUSION? Blood Transfusion Information  A transfusion is the replacement of blood or some of its parts. Blood is made up of multiple cells which provide different functions.  Red blood cells carry oxygen and are used for blood loss replacement.  White blood cells fight against infection.  Platelets control bleeding.  Plasma helps clot blood.  Other blood products are available for specialized needs, such as hemophilia or other clotting disorders. BEFORE THE TRANSFUSION  Who gives blood for transfusions?   Healthy volunteers who are fully evaluated to make sure their blood is safe. This is blood bank blood. Transfusion therapy is the safest it has ever been in the practice of medicine. Before blood is taken from a donor, a complete history is taken  to make sure that person has no history of diseases nor engages in risky social behavior (examples are intravenous drug use or sexual activity with multiple partners). The donor's travel history is screened to minimize risk of transmitting infections, such as malaria. The donated blood is tested for signs of infectious diseases, such as HIV and hepatitis. The blood is then  tested to be sure it is compatible with you in order to minimize the chance of a transfusion reaction. If you or a relative donates blood, this is often done in anticipation of surgery and is not appropriate for emergency situations. It takes many days to process the donated blood. RISKS AND COMPLICATIONS Although transfusion therapy is very safe and saves many lives, the main dangers of transfusion include:   Getting an infectious disease.  Developing a transfusion reaction. This is an allergic reaction to something in the blood you were given. Every precaution is taken to prevent this. The decision to have a blood transfusion has been considered carefully by your caregiver before blood is given. Blood is not given unless the benefits outweigh the risks. AFTER THE TRANSFUSION  Right after receiving a blood transfusion, you will usually feel much better and more energetic. This is especially true if your red blood cells have gotten low (anemic). The transfusion raises the level of the red blood cells which carry oxygen, and this usually causes an energy increase.  The nurse administering the transfusion will monitor you carefully for complications. HOME CARE INSTRUCTIONS  No special instructions are needed after a transfusion. You may find your energy is better. Speak with your caregiver about any limitations on activity for underlying diseases you may have. SEEK MEDICAL CARE IF:   Your condition is not improving after your transfusion.  You develop redness or irritation at the intravenous (IV) site. SEEK IMMEDIATE MEDICAL CARE  IF:  Any of the following symptoms occur over the next 12 hours:  Shaking chills.  You have a temperature by mouth above 102 F (38.9 C), not controlled by medicine.  Chest, back, or muscle pain.  People around you feel you are not acting correctly or are confused.  Shortness of breath or difficulty breathing.  Dizziness and fainting.  You get a rash or develop hives.  You have a decrease in urine output.  Your urine turns a dark color or changes to pink, red, or brown. Any of the following symptoms occur over the next 10 days:  You have a temperature by mouth above 102 F (38.9 C), not controlled by medicine.  Shortness of breath.  Weakness after normal activity.  The white part of the eye turns yellow (jaundice).  You have a decrease in the amount of urine or are urinating less often.  Your urine turns a dark color or changes to pink, red, or brown. Document Released: 08/15/2000 Document Revised: 11/10/2011 Document Reviewed: 04/03/2008 [facility] Patient Information 2014 [place], [place].  _______________________________________________________________________

## 2019-03-09 NOTE — Progress Notes (Signed)
EKG 11-25-2018 EPIC  CHEST XRAY 11-25-18 EPIC

## 2019-03-11 ENCOUNTER — Encounter (HOSPITAL_COMMUNITY)
Admission: RE | Admit: 2019-03-11 | Discharge: 2019-03-11 | Disposition: A | Discharge status: Home / Self Care | Payer: BC Managed Care – PPO | Source: Ambulatory Visit | Attending: General Surgery | Admitting: General Surgery

## 2019-03-11 ENCOUNTER — Encounter (HOSPITAL_COMMUNITY): Payer: Self-pay

## 2019-03-11 ENCOUNTER — Other Ambulatory Visit: Payer: Self-pay

## 2019-03-11 DIAGNOSIS — Z01812 Encounter for preprocedural laboratory examination: Secondary | ICD-10-CM | POA: Diagnosis present

## 2019-03-11 DIAGNOSIS — K5780 Diverticulitis of intestine, part unspecified, with perforation and abscess without bleeding: Secondary | ICD-10-CM | POA: Diagnosis not present

## 2019-03-11 HISTORY — DX: Personal history of Methicillin resistant Staphylococcus aureus infection: Z86.14

## 2019-03-11 HISTORY — DX: Personal history of urinary calculi: Z87.442

## 2019-03-11 LAB — BASIC METABOLIC PANEL
Anion gap: 10 (ref 5–15)
BUN: 12 mg/dL (ref 6–20)
CO2: 24 mmol/L (ref 22–32)
Calcium: 9.1 mg/dL (ref 8.9–10.3)
Chloride: 102 mmol/L (ref 98–111)
Creatinine, Ser: 0.96 mg/dL (ref 0.61–1.24)
GFR calc Af Amer: >60 mL/min (ref >60)
GFR calc non Af Amer: >60 mL/min (ref >60)
Glucose, Bld: 94 mg/dL (ref 70–99)
Potassium: 5 mmol/L (ref 3.5–5.1)
Sodium: 136 mmol/L (ref 135–145)

## 2019-03-11 LAB — CBC
HCT: 51.6 % (ref 39.0–52.0)
Hemoglobin: 17.2 g/dL — ABNORMAL HIGH (ref 13.0–17.0)
MCH: 31.5 pg (ref 26.0–34.0)
MCHC: 33.3 g/dL (ref 30.0–36.0)
MCV: 94.5 fL (ref 80.0–100.0)
Platelets: 291 10*3/uL (ref 150–400)
RBC: 5.46 MIL/uL (ref 4.22–5.81)
RDW: 14.7 % (ref 11.5–15.5)
WBC: 8.4 10*3/uL (ref 4.0–10.5)
nRBC: 0 % (ref 0.0–0.2)

## 2019-03-12 ENCOUNTER — Other Ambulatory Visit (HOSPITAL_COMMUNITY)
Admission: RE | Admit: 2019-03-12 | Discharge: 2019-03-12 | Disposition: A | Discharge status: Home / Self Care | Payer: BC Managed Care – PPO | Source: Ambulatory Visit | Attending: General Surgery | Admitting: General Surgery

## 2019-03-12 DIAGNOSIS — Z01812 Encounter for preprocedural laboratory examination: Secondary | ICD-10-CM | POA: Insufficient documentation

## 2019-03-12 DIAGNOSIS — Z1159 Encounter for screening for other viral diseases: Secondary | ICD-10-CM | POA: Insufficient documentation

## 2019-03-12 LAB — SARS CORONAVIRUS 2 (TAT 6-24 HRS): SARS Coronavirus 2: NEGATIVE

## 2019-03-14 ENCOUNTER — Telehealth: Payer: Self-pay | Admitting: Gastroenterology

## 2019-03-14 NOTE — Telephone Encounter (Signed)
Spoke with patient regarding Covid-19 screening questions.  He was testing on Saturday for Covid and have not received the results  Covid-19 Screening Questions:  Do you now or have you had a fever in the last 14 days? No   Do you have any respiratory symptoms of shortness of breath or cough now or in the last 14 days? No   Do you have any family members or close contacts with diagnosed or suspected Covid-19 in the past 14 days? No   Have you been tested for Covid-19 and found to be positive?  Yes on Saturday, haven't received results yet.  No symptoms   Pt made aware of that care partner may wait in the car or come up to the lobby during the procedure but will need to provide their own mask.

## 2019-03-15 ENCOUNTER — Ambulatory Visit (AMBULATORY_SURGERY_CENTER): Payer: BC Managed Care – PPO | Admitting: Gastroenterology

## 2019-03-15 ENCOUNTER — Other Ambulatory Visit: Payer: Self-pay

## 2019-03-15 ENCOUNTER — Encounter: Payer: Self-pay | Admitting: Gastroenterology

## 2019-03-15 VITALS — BP 112/80 | HR 66 | Temp 99.1°F | Resp 16 | Ht 68.0 in | Wt 160.0 lb

## 2019-03-15 DIAGNOSIS — D125 Benign neoplasm of sigmoid colon: Secondary | ICD-10-CM | POA: Diagnosis not present

## 2019-03-15 DIAGNOSIS — K5732 Diverticulitis of large intestine without perforation or abscess without bleeding: Secondary | ICD-10-CM | POA: Diagnosis present

## 2019-03-15 DIAGNOSIS — K635 Polyp of colon: Secondary | ICD-10-CM

## 2019-03-15 DIAGNOSIS — K5900 Constipation, unspecified: Secondary | ICD-10-CM

## 2019-03-15 MED ORDER — SODIUM CHLORIDE 0.9 % IV SOLN: 500.0000 mL | Freq: Once | INTRAVENOUS | Status: DC

## 2019-03-15 MED ORDER — BUPIVACAINE LIPOSOME 1.3 % IJ SUSP
20.0000 mL | Freq: Once | INTRAMUSCULAR | Status: DC
  Filled 2019-03-15: qty 20

## 2019-03-15 NOTE — Op Note (Signed)
[place] Patient Name: Douglas Weber Procedure Date: 03/15/2019 1:19 PM MRN: [id number] Endoscopist: Remo Lipps P. Havery Moros , MD Age: 48 Referring MD:  Date of Birth: [birth year] Gender: Male Account #: [id number] Procedure:                Colonoscopy Indications:              This is the patient's first colonoscopy, Follow-up                            of recurrent sigmoid diverticulitis (history of                            perforating diverticulitis - drain in place,                            pending sigmoidectomy tomorrow) Medicines:                Monitored Anesthesia Care Procedure:                Pre-Anesthesia Assessment:                           - Prior to the procedure, a History and Physical                            was performed, and patient medications and                            allergies were reviewed. The patient's tolerance of                            previous anesthesia was also reviewed. The risks                            and benefits of the procedure and the sedation                            options and risks were discussed with the patient.                            All questions were answered, and informed consent                            was obtained. Prior Anticoagulants: The patient has                            taken no previous anticoagulant or antiplatelet                            agents. ASA Grade Assessment: II - A patient with                            mild systemic disease. After reviewing the risks  and benefits, the patient was deemed in                            satisfactory condition to undergo the procedure.                           After obtaining informed consent, the colonoscope                            was passed under direct vision. Throughout the                            procedure, the patient's blood pressure, pulse, and                            oxygen saturations were  monitored continuously. The                            Colonoscope was introduced through the anus and                            advanced to the the cecum, identified by                            appendiceal orifice and ileocecal valve. The                            colonoscopy was performed without difficulty. The                            patient tolerated the procedure well. The quality                            of the bowel preparation was adequate. The                            ileocecal valve, appendiceal orifice, and rectum                            were photographed. Scope In: 1:22:08 PM Scope Out: 1:36:27 PM Scope Withdrawal Time: 0 hours 11 minutes 42 seconds  Total Procedure Duration: 0 hours 14 minutes 19 seconds  Findings:                 The perianal and digital rectal examinations were                            normal.                           Many small-mouthed diverticula were found in the                            sigmoid colon and descending colon, highest burden  in sigmoid colon with associated erythema /                            superficial inflammatory changes.                           A diminutive polyp was found in the sigmoid colon.                            The polyp was sessile. The polyp was removed with a                            cold biopsy forceps. Resection and retrieval were                            complete.                           Internal hemorrhoids were found during                            retroflexion. The hemorrhoids were small.                           The exam was otherwise without abnormality. Complications:            No immediate complications. Estimated blood loss:                            Minimal. Estimated Blood Loss:     Estimated blood loss was minimal. Impression:               - Diverticulosis in the sigmoid colon and in the                            descending colon which correlate  to history of                            diverticulitis, some associated inflammatory                            changes.                           - One diminutive polyp in the sigmoid colon,                            removed with a cold biopsy forceps. Resected and                            retrieved.                           - Internal hemorrhoids.                           - The examination was otherwise normal.  Recommendation:           - Patient has a contact number available for                            emergencies. The signs and symptoms of potential                            delayed complications were discussed with the                            patient. Return to normal activities tomorrow.                            Written discharge instructions were provided to the                            patient.                           - Resume previous diet.                           - Continue present medications.                           - Await pathology results.                           - Proceed with surgery tomorrow as planned with Dr.                            Shon Hough P. Armbruster, MD 03/15/2019 1:42:11 PM This report has been signed electronically.

## 2019-03-15 NOTE — Progress Notes (Signed)
Called to room to assist during endoscopic procedure.  Patient ID and intended procedure confirmed with present staff. Received instructions for my participation in the procedure from the performing physician.  

## 2019-03-15 NOTE — Patient Instructions (Signed)
Information on polyps, hemorrhoids and diverticulosis given to you today.  Await pathology results.  YOU HAD AN ENDOSCOPIC PROCEDURE TODAY AT [facility]:   Refer to the procedure report that was given to you for any specific questions about what was found during the examination.  If the procedure report does not answer your questions, please call your gastroenterologist to clarify.  If you requested that your care partner not be given the details of your procedure findings, then the procedure report has been included in a sealed envelope for you to review at your convenience later.  YOU SHOULD EXPECT: Some feelings of bloating in the abdomen. Passage of more gas than usual.  Walking can help get rid of the air that was put into your GI tract during the procedure and reduce the bloating. If you had a lower endoscopy (such as a colonoscopy or flexible sigmoidoscopy) you may notice spotting of blood in your stool or on the toilet paper. If you underwent a bowel prep for your procedure, you may not have a normal bowel movement for a few days.  Please Note:  You might notice some irritation and congestion in your nose or some drainage.  This is from the oxygen used during your procedure.  There is no need for concern and it should clear up in a day or so.  SYMPTOMS TO REPORT IMMEDIATELY:   Following lower endoscopy (colonoscopy or flexible sigmoidoscopy):  Excessive amounts of blood in the stool  Significant tenderness or worsening of abdominal pains  Swelling of the abdomen that is new, acute  Fever of 100F or higher   For urgent or emergent issues, a gastroenterologist can be reached at any hour by calling [phone].   DIET:  We do recommend a small meal at first, but then you may proceed to your regular diet.  Drink plenty of fluids but you should avoid alcoholic beverages for 24 hours.  ACTIVITY:  You should plan to take it easy for the rest of today and you should NOT  DRIVE or use heavy machinery until tomorrow (because of the sedation medicines used during the test).    FOLLOW UP: Our staff will call the number listed on your records 48-72 hours following your procedure to check on you and address any questions or concerns that you may have regarding the information given to you following your procedure. If we do not reach you, we will leave a message.  We will attempt to reach you two times.  During this call, we will ask if you have developed any symptoms of COVID 19. If you develop any symptoms (ie: fever, flu-like symptoms, shortness of breath, cough etc.) before then, please call [phone].  If you test positive for Covid 19 in the 2 weeks post procedure, please call and report this information to [place].    If any biopsies were taken you will be contacted by phone or by letter within the next 1-3 weeks.  Please call us at [phone] if you have not heard about the biopsies in 3 weeks.    SIGNATURES/CONFIDENTIALITY: You and/or your care partner have signed paperwork which will be entered into your electronic medical record.  These signatures attest to the fact that that the information above on your After Visit Summary has been reviewed and is understood.  Full responsibility of the confidentiality of this discharge information lies with you and/or your care-partner.

## 2019-03-15 NOTE — Progress Notes (Signed)
Pt's states no medical or surgical changes since previsit or office visit. Covid- Nancy Vitals- Court[place] 

## 2019-03-15 NOTE — Progress Notes (Signed)
A/ox3, pleased with MAC, report to RN 

## 2019-03-16 ENCOUNTER — Other Ambulatory Visit: Payer: Self-pay

## 2019-03-16 ENCOUNTER — Inpatient Hospital Stay (HOSPITAL_COMMUNITY): Payer: BC Managed Care – PPO | Admitting: Certified Registered Nurse Anesthetist

## 2019-03-16 ENCOUNTER — Encounter (HOSPITAL_COMMUNITY): Payer: Self-pay

## 2019-03-16 ENCOUNTER — Encounter (HOSPITAL_COMMUNITY)
Admission: RE | Disposition: A | Discharge status: Home / Self Care | Payer: Self-pay | Source: Home / Self Care | Attending: General Surgery

## 2019-03-16 ENCOUNTER — Inpatient Hospital Stay (HOSPITAL_COMMUNITY): Payer: BC Managed Care – PPO | Admitting: Physician Assistant

## 2019-03-16 ENCOUNTER — Inpatient Hospital Stay (HOSPITAL_COMMUNITY)
Admission: RE | Admit: 2019-03-16 | Discharge: 2019-03-18 | DRG: 330 | Disposition: A | Discharge status: Home / Self Care | Payer: BC Managed Care – PPO | Attending: General Surgery | Admitting: General Surgery

## 2019-03-16 DIAGNOSIS — Z87891 Personal history of nicotine dependence: Secondary | ICD-10-CM | POA: Diagnosis not present

## 2019-03-16 DIAGNOSIS — K5720 Diverticulitis of large intestine with perforation and abscess without bleeding: Principal | ICD-10-CM | POA: Diagnosis present

## 2019-03-16 DIAGNOSIS — K5790 Diverticulosis of intestine, part unspecified, without perforation or abscess without bleeding: Secondary | ICD-10-CM | POA: Diagnosis present

## 2019-03-16 DIAGNOSIS — K632 Fistula of intestine: Secondary | ICD-10-CM | POA: Diagnosis present

## 2019-03-16 SURGERY — COLECTOMY, PARTIAL, ROBOT-ASSISTED, LAPAROSCOPIC
Anesthesia: General | Site: Abdomen

## 2019-03-16 MED ORDER — ONDANSETRON HCL 4 MG/2ML IJ SOLN
INTRAMUSCULAR | Status: AC
  Filled 2019-03-16: qty 2

## 2019-03-16 MED ORDER — DEXAMETHASONE SODIUM PHOSPHATE 10 MG/ML IJ SOLN
INTRAMUSCULAR | Status: DC | PRN
  Administered 2019-03-16: 8 mg via INTRAVENOUS

## 2019-03-16 MED ORDER — PHENYLEPHRINE 40 MCG/ML (10ML) SYRINGE FOR IV PUSH (FOR BLOOD PRESSURE SUPPORT)
PREFILLED_SYRINGE | INTRAVENOUS | Status: AC
  Filled 2019-03-16: qty 10

## 2019-03-16 MED ORDER — FENTANYL CITRATE (PF) 100 MCG/2ML IJ SOLN
INTRAMUSCULAR | Status: AC
  Filled 2019-03-16: qty 4

## 2019-03-16 MED ORDER — DIPHENHYDRAMINE HCL 25 MG PO CAPS: 25.0000 mg | ORAL_CAPSULE | Freq: Four times a day (QID) | ORAL | Status: DC | PRN

## 2019-03-16 MED ORDER — ONDANSETRON HCL 4 MG/2ML IJ SOLN
INTRAMUSCULAR | Status: DC | PRN
  Administered 2019-03-16: 4 mg via INTRAVENOUS

## 2019-03-16 MED ORDER — PROPOFOL 10 MG/ML IV BOLUS
INTRAVENOUS | Status: AC
  Filled 2019-03-16: qty 40

## 2019-03-16 MED ORDER — SODIUM CHLORIDE 0.9 % IV SOLN
INTRAVENOUS | Status: DC | PRN
  Administered 2019-03-16: 11:00:00 25 ug/min via INTRAVENOUS

## 2019-03-16 MED ORDER — MIDAZOLAM HCL 2 MG/2ML IJ SOLN
INTRAMUSCULAR | Status: DC | PRN
  Administered 2019-03-16: 2 mg via INTRAVENOUS

## 2019-03-16 MED ORDER — HYDROMORPHONE HCL 1 MG/ML IJ SOLN
0.5000 mg | INTRAMUSCULAR | Status: DC | PRN
  Administered 2019-03-16: 0.5 mg via INTRAVENOUS
  Filled 2019-03-16: qty 0.5

## 2019-03-16 MED ORDER — LIDOCAINE HCL (CARDIAC) PF 100 MG/5ML IV SOSY
PREFILLED_SYRINGE | INTRAVENOUS | Status: DC | PRN
  Administered 2019-03-16: 80 mg via INTRAVENOUS

## 2019-03-16 MED ORDER — ROCURONIUM BROMIDE 10 MG/ML (PF) SYRINGE
PREFILLED_SYRINGE | INTRAVENOUS | Status: AC
  Filled 2019-03-16: qty 10

## 2019-03-16 MED ORDER — FENTANYL CITRATE (PF) 100 MCG/2ML IJ SOLN
25.0000 ug | INTRAMUSCULAR | Status: DC | PRN
  Administered 2019-03-16 (×3): 50 ug via INTRAVENOUS

## 2019-03-16 MED ORDER — SODIUM CHLORIDE 0.9 % IV SOLN
2.0000 g | Freq: Two times a day (BID) | INTRAVENOUS | Status: AC
  Administered 2019-03-16: 2 g via INTRAVENOUS
  Filled 2019-03-16: qty 2

## 2019-03-16 MED ORDER — KETAMINE HCL 10 MG/ML IJ SOLN
INTRAMUSCULAR | Status: AC
  Filled 2019-03-16: qty 1

## 2019-03-16 MED ORDER — LIDOCAINE 2% (20 MG/ML) 5 ML SYRINGE
INTRAMUSCULAR | Status: AC
  Filled 2019-03-16: qty 10

## 2019-03-16 MED ORDER — SUGAMMADEX SODIUM 200 MG/2ML IV SOLN
INTRAVENOUS | Status: DC | PRN
  Administered 2019-03-16: 150 mg via INTRAVENOUS

## 2019-03-16 MED ORDER — FENTANYL CITRATE (PF) 250 MCG/5ML IJ SOLN
INTRAMUSCULAR | Status: AC
  Filled 2019-03-16: qty 5

## 2019-03-16 MED ORDER — KETOROLAC TROMETHAMINE 30 MG/ML IJ SOLN
INTRAMUSCULAR | Status: AC
  Filled 2019-03-16: qty 1

## 2019-03-16 MED ORDER — HYDROMORPHONE HCL 1 MG/ML IJ SOLN
0.2500 mg | INTRAMUSCULAR | Status: DC | PRN
  Administered 2019-03-16 (×2): 0.5 mg via INTRAVENOUS

## 2019-03-16 MED ORDER — LACTATED RINGERS IV SOLN
INTRAVENOUS | Status: DC
  Administered 2019-03-16 (×3): via INTRAVENOUS

## 2019-03-16 MED ORDER — TRAMADOL HCL 50 MG PO TABS
50.0000 mg | ORAL_TABLET | Freq: Four times a day (QID) | ORAL | Status: DC | PRN
  Administered 2019-03-16 – 2019-03-17 (×4): 50 mg via ORAL
  Filled 2019-03-16 (×4): qty 1

## 2019-03-16 MED ORDER — DIPHENHYDRAMINE HCL 50 MG/ML IJ SOLN: 25.0000 mg | Freq: Four times a day (QID) | INTRAMUSCULAR | Status: DC | PRN

## 2019-03-16 MED ORDER — ENSURE SURGERY PO LIQD
237.0000 mL | Freq: Two times a day (BID) | ORAL | Status: DC
  Administered 2019-03-16 – 2019-03-18 (×4): 237 mL via ORAL
  Filled 2019-03-16 (×5): qty 237

## 2019-03-16 MED ORDER — PROMETHAZINE HCL 25 MG/ML IJ SOLN: 6.2500 mg | INTRAMUSCULAR | Status: DC | PRN

## 2019-03-16 MED ORDER — ALVIMOPAN 12 MG PO CAPS
12.0000 mg | ORAL_CAPSULE | Freq: Two times a day (BID) | ORAL | Status: DC
  Administered 2019-03-17: 12 mg via ORAL
  Filled 2019-03-16: qty 1

## 2019-03-16 MED ORDER — PHENYLEPHRINE HCL (PRESSORS) 10 MG/ML IV SOLN
INTRAVENOUS | Status: AC
  Filled 2019-03-16: qty 1

## 2019-03-16 MED ORDER — BUPIVACAINE LIPOSOME 1.3 % IJ SUSP
INTRAMUSCULAR | Status: DC | PRN
  Administered 2019-03-16: 20 mL

## 2019-03-16 MED ORDER — KETOROLAC TROMETHAMINE 30 MG/ML IJ SOLN
30.0000 mg | Freq: Once | INTRAMUSCULAR | Status: AC | PRN
  Administered 2019-03-16: 30 mg via INTRAVENOUS

## 2019-03-16 MED ORDER — DEXAMETHASONE SODIUM PHOSPHATE 10 MG/ML IJ SOLN
INTRAMUSCULAR | Status: AC
  Filled 2019-03-16: qty 1

## 2019-03-16 MED ORDER — ACETAMINOPHEN 500 MG PO TABS
1000.0000 mg | ORAL_TABLET | ORAL | Status: AC
  Administered 2019-03-16: 1000 mg via ORAL
  Filled 2019-03-16: qty 2

## 2019-03-16 MED ORDER — MIDAZOLAM HCL 2 MG/2ML IJ SOLN
INTRAMUSCULAR | Status: AC
  Filled 2019-03-16: qty 2

## 2019-03-16 MED ORDER — 0.9 % SODIUM CHLORIDE (POUR BTL) OPTIME
TOPICAL | Status: DC | PRN
  Administered 2019-03-16: 2000 mL

## 2019-03-16 MED ORDER — ENOXAPARIN SODIUM 40 MG/0.4ML ~~LOC~~ SOLN
40.0000 mg | SUBCUTANEOUS | Status: DC
  Administered 2019-03-17 – 2019-03-18 (×2): 40 mg via SUBCUTANEOUS
  Filled 2019-03-16 (×2): qty 0.4

## 2019-03-16 MED ORDER — FENTANYL CITRATE (PF) 250 MCG/5ML IJ SOLN
INTRAMUSCULAR | Status: DC | PRN
  Administered 2019-03-16 (×2): 50 ug via INTRAVENOUS
  Administered 2019-03-16: 100 ug via INTRAVENOUS
  Administered 2019-03-16: 50 ug via INTRAVENOUS

## 2019-03-16 MED ORDER — GABAPENTIN 300 MG PO CAPS
300.0000 mg | ORAL_CAPSULE | Freq: Two times a day (BID) | ORAL | Status: DC
  Administered 2019-03-16 – 2019-03-18 (×4): 300 mg via ORAL
  Filled 2019-03-16 (×4): qty 1

## 2019-03-16 MED ORDER — PHENYLEPHRINE 40 MCG/ML (10ML) SYRINGE FOR IV PUSH (FOR BLOOD PRESSURE SUPPORT)
PREFILLED_SYRINGE | INTRAVENOUS | Status: DC | PRN
  Administered 2019-03-16 (×4): 80 ug via INTRAVENOUS
  Administered 2019-03-16: 40 ug via INTRAVENOUS
  Administered 2019-03-16: 80 ug via INTRAVENOUS
  Administered 2019-03-16 (×2): 40 ug via INTRAVENOUS

## 2019-03-16 MED ORDER — BUPIVACAINE-EPINEPHRINE (PF) 0.25% -1:200000 IJ SOLN
INTRAMUSCULAR | Status: AC
  Filled 2019-03-16: qty 30

## 2019-03-16 MED ORDER — SODIUM CHLORIDE 0.9 % IV SOLN
2.0000 g | INTRAVENOUS | Status: AC
  Administered 2019-03-16: 08:00:00 2 g via INTRAVENOUS
  Filled 2019-03-16: qty 2

## 2019-03-16 MED ORDER — LACTATED RINGERS IR SOLN
Status: DC | PRN
  Administered 2019-03-16: 1000 mL

## 2019-03-16 MED ORDER — PROPOFOL 10 MG/ML IV BOLUS
INTRAVENOUS | Status: DC | PRN
  Administered 2019-03-16: 200 mg via INTRAVENOUS

## 2019-03-16 MED ORDER — ALVIMOPAN 12 MG PO CAPS
12.0000 mg | ORAL_CAPSULE | ORAL | Status: AC
  Administered 2019-03-16: 12 mg via ORAL
  Filled 2019-03-16: qty 1

## 2019-03-16 MED ORDER — BUPIVACAINE-EPINEPHRINE (PF) 0.25% -1:200000 IJ SOLN
INTRAMUSCULAR | Status: DC | PRN
  Administered 2019-03-16: 30 mL

## 2019-03-16 MED ORDER — ARTIFICIAL TEARS OPHTHALMIC OINT
TOPICAL_OINTMENT | OPHTHALMIC | Status: AC
  Filled 2019-03-16: qty 3.5

## 2019-03-16 MED ORDER — SACCHAROMYCES BOULARDII 250 MG PO CAPS
250.0000 mg | ORAL_CAPSULE | Freq: Two times a day (BID) | ORAL | Status: DC
  Administered 2019-03-16 – 2019-03-18 (×4): 250 mg via ORAL
  Filled 2019-03-16 (×4): qty 1

## 2019-03-16 MED ORDER — ARTIFICIAL TEARS OPHTHALMIC OINT
TOPICAL_OINTMENT | OPHTHALMIC | Status: DC | PRN
  Administered 2019-03-16: 1 via OPHTHALMIC

## 2019-03-16 MED ORDER — HYDROMORPHONE HCL 1 MG/ML IJ SOLN
INTRAMUSCULAR | Status: AC
  Filled 2019-03-16: qty 2

## 2019-03-16 MED ORDER — ROCURONIUM BROMIDE 10 MG/ML (PF) SYRINGE
PREFILLED_SYRINGE | INTRAVENOUS | Status: DC | PRN
  Administered 2019-03-16: 10 mg via INTRAVENOUS
  Administered 2019-03-16: 20 mg via INTRAVENOUS
  Administered 2019-03-16: 70 mg via INTRAVENOUS
  Administered 2019-03-16: 10 mg via INTRAVENOUS

## 2019-03-16 MED ORDER — ONDANSETRON HCL 4 MG PO TABS: 4.0000 mg | ORAL_TABLET | Freq: Four times a day (QID) | ORAL | Status: DC | PRN

## 2019-03-16 MED ORDER — KCL IN DEXTROSE-NACL 20-5-0.45 MEQ/L-%-% IV SOLN
INTRAVENOUS | Status: DC
  Administered 2019-03-16: 14:00:00 via INTRAVENOUS
  Filled 2019-03-16: qty 1000

## 2019-03-16 MED ORDER — ACETAMINOPHEN 500 MG PO TABS
1000.0000 mg | ORAL_TABLET | Freq: Four times a day (QID) | ORAL | Status: DC
  Administered 2019-03-16 – 2019-03-18 (×7): 1000 mg via ORAL
  Filled 2019-03-16 (×8): qty 2

## 2019-03-16 MED ORDER — LIDOCAINE 2% (20 MG/ML) 5 ML SYRINGE
INTRAMUSCULAR | Status: DC | PRN
  Administered 2019-03-16: 10:00:00 via INTRAVENOUS
  Administered 2019-03-16: 1.5 mg/kg/h via INTRAVENOUS

## 2019-03-16 MED ORDER — GABAPENTIN 300 MG PO CAPS
300.0000 mg | ORAL_CAPSULE | ORAL | Status: AC
  Administered 2019-03-16: 300 mg via ORAL
  Filled 2019-03-16: qty 1

## 2019-03-16 MED ORDER — LIDOCAINE 2% (20 MG/ML) 5 ML SYRINGE
INTRAMUSCULAR | Status: AC
  Filled 2019-03-16: qty 5

## 2019-03-16 MED ORDER — ONDANSETRON HCL 4 MG/2ML IJ SOLN: 4.0000 mg | Freq: Four times a day (QID) | INTRAMUSCULAR | Status: DC | PRN

## 2019-03-16 MED ORDER — KETAMINE HCL 10 MG/ML IJ SOLN
INTRAMUSCULAR | Status: DC | PRN
  Administered 2019-03-16: 35 mg via INTRAVENOUS

## 2019-03-16 MED ORDER — ALUM & MAG HYDROXIDE-SIMETH 200-200-20 MG/5ML PO SUSP: 30.0000 mL | Freq: Four times a day (QID) | ORAL | Status: DC | PRN

## 2019-03-16 SURGICAL SUPPLY — 114 items
ADH SKN CLS APL DERMABOND .7 (GAUZE/BANDAGES/DRESSINGS) ×1
BLADE EXTENDED COATED 6.5IN (ELECTRODE) IMPLANT
CANNULA REDUC XI 12-8 STAPL (CANNULA) ×1
CANNULA REDUC XI 12-8MM STAPL (CANNULA) ×1
CANNULA REDUCER 12-8 DVNC XI (CANNULA) ×1 IMPLANT
CELLS DAT CNTRL 66122 CELL SVR (MISCELLANEOUS) IMPLANT
CLIP VESOLOCK LG 6/CT PURPLE (CLIP) IMPLANT
CLIP VESOLOCK MED 6/CT (CLIP) IMPLANT
COVER SURGICAL LIGHT HANDLE (MISCELLANEOUS) ×6 IMPLANT
COVER TIP SHEARS 8 DVNC (MISCELLANEOUS) ×1 IMPLANT
COVER TIP SHEARS 8MM DA VINCI (MISCELLANEOUS) ×2
COVER WAND RF STERILE (DRAPES) ×3 IMPLANT
DECANTER SPIKE VIAL GLASS SM (MISCELLANEOUS) ×2 IMPLANT
DERMABOND ADVANCED (GAUZE/BANDAGES/DRESSINGS) ×2
DERMABOND ADVANCED .7 DNX12 (GAUZE/BANDAGES/DRESSINGS) IMPLANT
DRAIN CHANNEL 19F RND (DRAIN) IMPLANT
DRAPE ARM DVNC X/XI (DISPOSABLE) ×4 IMPLANT
DRAPE COLUMN DVNC XI (DISPOSABLE) ×1 IMPLANT
DRAPE DA VINCI XI ARM (DISPOSABLE) ×8
DRAPE DA VINCI XI COLUMN (DISPOSABLE) ×2
DRAPE SURG IRRIG POUCH 19X23 (DRAPES) ×3 IMPLANT
DRSG OPSITE POSTOP 4X10 (GAUZE/BANDAGES/DRESSINGS) IMPLANT
DRSG OPSITE POSTOP 4X6 (GAUZE/BANDAGES/DRESSINGS) ×2 IMPLANT
DRSG OPSITE POSTOP 4X8 (GAUZE/BANDAGES/DRESSINGS) IMPLANT
DRSG TEGADERM 2-3/8X2-3/4 SM (GAUZE/BANDAGES/DRESSINGS) ×2 IMPLANT
ELECT PENCIL ROCKER SW 15FT (MISCELLANEOUS) ×3 IMPLANT
ELECT REM PT RETURN 15FT ADLT (MISCELLANEOUS) ×3 IMPLANT
ENDOLOOP SUT PDS II  0 18 (SUTURE)
ENDOLOOP SUT PDS II 0 18 (SUTURE) IMPLANT
EVACUATOR SILICONE 100CC (DRAIN) IMPLANT
GAUZE SPONGE 2X2 8PLY STRL LF (GAUZE/BANDAGES/DRESSINGS) IMPLANT
GAUZE SPONGE 4X4 12PLY STRL (GAUZE/BANDAGES/DRESSINGS) IMPLANT
GLOVE BIO SURGEON STRL SZ 6 (GLOVE) ×4 IMPLANT
GLOVE BIO SURGEON STRL SZ 6.5 (GLOVE) ×8 IMPLANT
GLOVE BIO SURGEONS STRL SZ 6.5 (GLOVE) ×5
GLOVE BIOGEL PI IND STRL 6 (GLOVE) IMPLANT
GLOVE BIOGEL PI IND STRL 6.5 (GLOVE) IMPLANT
GLOVE BIOGEL PI IND STRL 7.0 (GLOVE) ×3 IMPLANT
GLOVE BIOGEL PI INDICATOR 6 (GLOVE) ×4
GLOVE BIOGEL PI INDICATOR 6.5 (GLOVE) ×4
GLOVE BIOGEL PI INDICATOR 7.0 (GLOVE) ×10
GLOVE ECLIPSE 6.5 STRL STRAW (GLOVE) ×4 IMPLANT
GLOVE SURG SS PI 7.0 STRL IVOR (GLOVE) ×4 IMPLANT
GOWN STRL REUS W/ TWL LRG LVL3 (GOWN DISPOSABLE) IMPLANT
GOWN STRL REUS W/TWL 2XL LVL3 (GOWN DISPOSABLE) ×9 IMPLANT
GOWN STRL REUS W/TWL LRG LVL3 (GOWN DISPOSABLE) ×12
GOWN STRL REUS W/TWL XL LVL3 (GOWN DISPOSABLE) ×8 IMPLANT
GRASPER ENDOPATH ANVIL 10MM (MISCELLANEOUS) IMPLANT
GRASPER SUT TROCAR 14GX15 (MISCELLANEOUS) IMPLANT
HOLDER FOLEY CATH W/STRAP (MISCELLANEOUS) ×3 IMPLANT
IRRIG SUCT STRYKERFLOW 2 WTIP (MISCELLANEOUS) ×3
IRRIGATION SUCT STRKRFLW 2 WTP (MISCELLANEOUS) ×1 IMPLANT
IRRIGATOR SUCT 8 DISP DVNC XI (IRRIGATION / IRRIGATOR) IMPLANT
IRRIGATOR SUCTION 8MM XI DISP (IRRIGATION / IRRIGATOR)
KIT PROCEDURE DA VINCI SI (MISCELLANEOUS)
KIT PROCEDURE DVNC SI (MISCELLANEOUS) ×1 IMPLANT
KIT SIGMOIDOSCOPE (SET/KITS/TRAYS/PACK) ×2 IMPLANT
KIT TURNOVER KIT A (KITS) IMPLANT
NDL INSUFFLATION 14GA 120MM (NEEDLE) ×1 IMPLANT
NEEDLE INSUFFLATION 14GA 120MM (NEEDLE) ×3 IMPLANT
PACK CARDIOVASCULAR III (CUSTOM PROCEDURE TRAY) ×3 IMPLANT
PACK COLON (CUSTOM PROCEDURE TRAY) ×3 IMPLANT
PORT LAP GEL ALEXIS MED 5-9CM (MISCELLANEOUS) ×3 IMPLANT
RELOAD STAPLE 45 BLU REG DVNC (STAPLE) IMPLANT
RELOAD STAPLE 45 GRN THCK DVNC (STAPLE) IMPLANT
RETRACTOR WND ALEXIS 18 MED (MISCELLANEOUS) IMPLANT
RTRCTR WOUND ALEXIS 18CM MED (MISCELLANEOUS)
SCISSORS LAP 5X35 DISP (ENDOMECHANICALS) ×3 IMPLANT
SEAL CANN UNIV 5-8 DVNC XI (MISCELLANEOUS) ×3 IMPLANT
SEAL XI 5MM-8MM UNIVERSAL (MISCELLANEOUS) ×8
SEALER VESSEL DA VINCI XI (MISCELLANEOUS) ×2
SEALER VESSEL EXT DVNC XI (MISCELLANEOUS) ×1 IMPLANT
SLEEVE ADV FIXATION 5X100MM (TROCAR) IMPLANT
SOLUTION ELECTROLUBE (MISCELLANEOUS) ×3 IMPLANT
SPONGE GAUZE 2X2 STER 10/PKG (GAUZE/BANDAGES/DRESSINGS) ×2
SPONGE LAP 18X18 X RAY DECT (DISPOSABLE) ×2 IMPLANT
STAPLER 45 BLU RELOAD XI (STAPLE) IMPLANT
STAPLER 45 BLUE RELOAD XI (STAPLE)
STAPLER 45 GREEN RELOAD XI (STAPLE) ×4
STAPLER 45 GRN RELOAD XI (STAPLE) ×2 IMPLANT
STAPLER CANNULA SEAL DVNC XI (STAPLE) ×1 IMPLANT
STAPLER CANNULA SEAL XI (STAPLE) ×2
STAPLER ECHELON POWER CIR 29 (STAPLE) ×2 IMPLANT
STAPLER SHEATH (SHEATH) ×2
STAPLER SHEATH ENDOWRIST DVNC (SHEATH) ×1 IMPLANT
STAPLER VISISTAT 35W (STAPLE) IMPLANT
SUT ETHILON 2 0 PS N (SUTURE) IMPLANT
SUT NOVA NAB DX-16 0-1 5-0 T12 (SUTURE) ×6 IMPLANT
SUT PROLENE 2 0 KS (SUTURE) ×3 IMPLANT
SUT SILK 2 0 (SUTURE) ×3
SUT SILK 2 0 SH CR/8 (SUTURE) ×2 IMPLANT
SUT SILK 2-0 18XBRD TIE 12 (SUTURE) ×1 IMPLANT
SUT SILK 3 0 (SUTURE)
SUT SILK 3 0 SH CR/8 (SUTURE) ×3 IMPLANT
SUT SILK 3-0 18XBRD TIE 12 (SUTURE) IMPLANT
SUT V-LOC BARB 180 2/0GR6 GS22 (SUTURE)
SUT VIC AB 2-0 SH 18 (SUTURE) IMPLANT
SUT VIC AB 2-0 SH 27 (SUTURE) ×3
SUT VIC AB 2-0 SH 27X BRD (SUTURE) ×1 IMPLANT
SUT VIC AB 3-0 SH 18 (SUTURE) IMPLANT
SUT VIC AB 4-0 PS2 27 (SUTURE) ×6 IMPLANT
SUT VICRYL 0 UR6 27IN ABS (SUTURE) ×3 IMPLANT
SUTURE V-LC BRB 180 2/0GR6GS22 (SUTURE) IMPLANT
SYR 10ML ECCENTRIC (SYRINGE) ×3 IMPLANT
SYS LAPSCP GELPORT 120MM (MISCELLANEOUS)
SYSTEM LAPSCP GELPORT 120MM (MISCELLANEOUS) IMPLANT
TOWEL OR 17X26 10 PK STRL BLUE (TOWEL DISPOSABLE) IMPLANT
TOWEL OR NON WOVEN STRL DISP B (DISPOSABLE) ×3 IMPLANT
TRAY FOLEY MTR SLVR 16FR STAT (SET/KITS/TRAYS/PACK) ×3 IMPLANT
TROCAR ADV FIXATION 5X100MM (TROCAR) ×3 IMPLANT
TUBING CONNECTING 10 (TUBING) ×4 IMPLANT
TUBING CONNECTING 10' (TUBING) ×2
TUBING INSUFFLATION 10FT LAP (TUBING) ×3 IMPLANT
YANKAUER SUCT BULB TIP 10FT TU (MISCELLANEOUS) ×2 IMPLANT

## 2019-03-16 NOTE — H&P (Signed)
The patient is a 48 year old male who presents with diverticulitis. Past history of perforated diverticulitis with left lower quadrant abscess, treated with a drain on 07/15/18. His primary abscess cavity completely resolved so the drain was removed on 08/12/18, but there was a loculated fluid collection along the left upper quadrant in the perisplenic region so this fluid collection was aspirated growing bacteroides fragilis. He was placed on Augmentin.   On 09/11/18, he presented to the ER with back pain. CT scan revealed renal stone and recurrent left upper quadrant fluid collection. A drain was placed. He was then discharged on stable condition. He had been doing well at home until he underwent a drain exchange in radiology. He then developed pain around his drain site difficulty walking and a temperature to 101.5. Patient was rehospitalized and the drain was repositioned by interventional radiology. He was then discharged 2 days later in stable condition. He has been at home on oral antibiotics since then. He has had no further issues. He is tolerating a diet and having regular bowel movements with the assistance of MiraLAX. He has gained some weight back.     Problem List/Past Medical Leighton Ruff, MD; 09/06/6061 11:20 AM) PAIN IN THE ABDOMEN (R10.9) ABSCESS (L02.91) DIVERTICULITIS OF LARGE INTESTINE WITH COMPLICATION (K16.01)  Past Surgical History Leighton Ruff, MD; 0/05/3234 11:20 AM) Tonsillectomy  Diagnostic Studies History Leighton Ruff, MD; 01/06/3219 11:20 AM) Colonoscopy never  Allergies (Tanisha A. Owens Shark, Thompson; 01/04/2019 11:03 AM) No Known Drug Allergies [08/11/2018]: Allergies Reconciled  Medication History Leighton Ruff, MD; 10/06/4268 11:20 AM) Medications Reconciled Neomycin Sulfate (500MG  Tablet, 2 (two) Oral SEE NOTE, Taken starting 01/04/2019) Active. (TAKE TWO TABLETS AT 2 PM, 3 PM, AND 10 PM THE DAY PRIOR TO SURGERY) Flagyl (500MG  Tablet,  2 (two) Oral SEE NOTE, Taken starting 01/04/2019) Active. (Take at 2pm, 3pm, and 10pm the day prior to your colon operation)  Social History Leighton Ruff, MD; 01/31/3761 11:20 AM) Alcohol use Occasional alcohol use. Caffeine use Carbonated beverages, Tea. No drug use Tobacco use Former smoker.  BP 134/88   Pulse 75   Temp 98.4 F (36.9 C) (Oral)   Resp 18   Ht 5\' 8"  (1.727 m)   Wt 72.6 kg   SpO2 100%   BMI 24.33 kg/m      Physical Exam   The physical exam findings are as follows: Note:Gen: alert and well appearing Eye: extraocular motion intact, no scleral icterus ENT: moist mucus membranes, dentition intact Neck: no mass or thyromegaly Chest: unlabored respirations, symmetrical air entry, clear bilaterally CV: regular rate and rhythm, no pedal edema Abdomen: soft, nontender, nondistended. No mass or organomegaly; left lower quadrant drain with thin light brown output, some thicker residue at the proximal portion of the tube MSK: strength symmetrical throughout, no deformity Neuro: grossly intact, normal gait Psych: normal mood and affect, appropriate insight Skin: warm and dry, no rash or lesion on limited exam    Assessment & Plan   DIVERTICULITIS OF LARGE INTESTINE WITH COMPLICATION (G31.51) Impression: 48 year old male who presents to the office for follow-up of his diverticular disease. Patient does have a remaining CT-guided drain. This is unable to be removed. I have recommended partial colectomy. Patient has underwent colonoscopy prior to surgery.  The surgery and anatomy were described to the patient as well as the risks of surgery and the possible complications. These include: Bleeding, deep abdominal infections and possible wound complications such as hernia and infection, damage to adjacent structures, leak of surgical  connections, which can lead to other surgeries and possibly an ostomy, possible need for other procedures, such as abscess drains  in radiology, possible prolonged hospital stay, possible diarrhea from removal of part of the colon, possible constipation from narcotics, possible bowel, bladder or sexual dysfunction if having rectal surgery, prolonged fatigue/weakness or appetite loss, possible early recurrence of of disease, possible complications of their medical problems such as heart disease or arrhythmias or lung problems, death (less than 1%). I believe the patient understands and wishes to proceed with the surgery.

## 2019-03-16 NOTE — Op Note (Signed)
03/16/2019  11:28 AM  PATIENT:  Douglas Weber  48 y.o. male  Patient Care Team: Fanny Bien, MD as PCP - General (Family Medicine)  PRE-OPERATIVE DIAGNOSIS:  DIVERTICULAR FISTULA  POST-OPERATIVE DIAGNOSIS:  DIVERTICULAR FISTULA  PROCEDURE:  XI ROBOT ASSISTED SIGMOID COLECTOMY   Surgeon(s): Leighton Ruff, MD Carlena Hurl, PA-C  ASSISTANT: Carlena Hurl, PA   ANESTHESIA:   local and general  EBL: 89ml Total I/O In: 1000 [I.V.:1000] Out: 100 [Urine:50; Blood:50]  Delay start of Pharmacological VTE agent (>24hrs) due to surgical blood loss or risk of bleeding:  no  DRAINS: none   SPECIMEN:  Source of Specimen:  Sigmoid colon with fistula  DISPOSITION OF SPECIMEN:  PATHOLOGY  COUNTS:  YES  PLAN OF CARE: Admit to inpatient   PATIENT DISPOSITION:  PACU - hemodynamically stable.  INDICATION:    48 y.o. M with persistent fistula after drain placement for diverticular abscess.  I recommended segmental resection:  The anatomy & physiology of the digestive tract was discussed.  The pathophysiology was discussed.  Natural history risks without surgery was discussed.   I worked to give an overview of the disease and the frequent need to have multispecialty involvement.  I feel the risks of no intervention will lead to serious problems that outweigh the operative risks; therefore, I recommended a partial colectomy to remove the pathology.  Laparoscopic & open techniques were discussed.   Risks such as bleeding, infection, abscess, leak, reoperation, possible ostomy, hernia, heart attack, death, and other risks were discussed.  I noted a good likelihood this will help address the problem.   Goals of post-operative recovery were discussed as well.    The patient expressed understanding & wished to proceed with surgery.  OR FINDINGS:   Patient had fistula from proximal sigmoid to abd wall.  The anastomosis rests 16 cm from the anal verge by rigid proctoscopy.  DESCRIPTION:    Informed consent was confirmed.  The patient underwent general anaesthesia without difficulty.  The patient was positioned appropriately.  VTE prevention in place.  The patient's abdomen was clipped, prepped, & draped in a sterile fashion.  Surgical timeout confirmed our plan.  The patient was positioned in reverse Trendelenburg.  Abdominal entry was gained using a Varies needle in the LUQ.  Entry was clean.  I induced carbon dioxide insufflation.  An 70mm robotic port was placed in the RUQ.  Camera inspection revealed no injury.  The omentum was adherent to the abdominal wall.  This was taken down using mostly blunt dissection.  The omentum was also adherent to several loops of small bowel in the left lower quadrant.  Extra ports were carefully placed under direct laparoscopic visualization.   The patient was appropriately positioned and the robot was docked to the patient's left side.  Instruments were placed under direct visualization.   I began by removing the loops of small bowel that were adherent to the colon in the left lower quadrant.  I also divided the omentum.  I reflected the omentum and the small bowel away from the colon.  I then dissected the colon away from the abdominal wall using mostly sharp dissection.  I mobilized the sigmoid colon off of the pelvic sidewall.  I continued my mobilization up the white line of Toldt.  I divided the fistula last with scissors.  This allowed for complete mobilization of the sigmoid colon.  This was then elevated upwards.  I scored the base of peritoneum of the right side  of the mesentery of the left colon from the ligament of Treitz to the peritoneal reflection of the mid rectum.   I elevated the sigmoid mesentery and enetered into the retro-mesenteric plane. We were able to identify the left ureter and gonadal vessels. We kept those posterior within the retroperitoneum and elevated the left colon mesentery off that. I did isolated IMA pedicle but did not  ligate it yet.  I continued distally and got into the avascular plane posterior to the mesorectum. This allowed me to help mobilize the rectum as well by freeing the mesorectum off the sacrum.  I mobilized the peritoneal coverings towards the peritoneal reflection on both the right and left sides of the rectum.  I could see the right and left ureters and stayed away from them.    I skeletonized the inferior mesenteric artery pedicle.  I isolated the inferior mesenteric vein off of the ligament of Treitz just cephalad to that as well.  After confirming the left ureter was out of the way, I went ahead and ligated the inferior mesenteric artery pedicle with bipolar robotic vessel sealer well above its takeoff from the aorta.   I did ligate the inferior mesenteric vein in a similar fashion.  We ensured hemostasis. I skeletonized the mesorectum at the junction at the proximal rectum using blunt dissection & bipolar robotic vessel sealer.  I mobilized the left colon in a lateral to medial fashion off the line of Toldt up towards the splenic flexure to ensure good mobilization of the left colon to reach into the pelvis.  I divided the rectosigmoid junction using a green load robotic stapler x2.  I divided the colon mesentery with the robotic vessel sealer to the level of the descending sigmoid junction.  At this point the robotic instruments were removed and the robot was undocked.  The abdomen was desufflated.  I enlarged my 12 mm port into a Pfannenstiel incision.  An Optima wound protector was placed.  The colon was brought out through the wound protector and divided over a pursestring device.  There did not appear to be good bleeding at the mucosal edges.  I evaluated proximally approximately 3 cm and there did appear to be better bleeding of the mucosa.  I repositioned my pursestring device at this point and divided the colon once again at this level.  There was good mesenteric bleeding and mucosal bleeding noted.   A 2 0 Prolene pursestring was placed.  This was secured with 3-0 silk sutures.  I then tied this pursestring around a 29 mm EEA anvil.  This was then placed back into the abdomen.  The EEA stapler was inserted into the rectal stump and brought out just anterior to the staple line.  An anastomosis was created.  There was no tension on the anastomosis.  There was no leak when tested with insufflation under water.  The abdomen was irrigated.  The small bowel that was resected off of the colon was inspected.  There was no injury noted.  The Alexis wound protector and ports were removed and we switched to clean gowns, gloves, instruments and drapes.  I then closed the peritoneum of the Pfannenstiel incision using a running 2-0 Vicryl suture.  Interrupted #1 Novafil's were used to close the fascia.  The subcutaneous tissue was reapproximated using a running 0 Vicryl suture.  The skin was closed using a running 4-0 Vicryl subcuticular suture.  The remaining port sites were also closed using 4-0 Vicryl subcuticular sutures  and Dermabond.  A sterile dressing was placed over the Pfannenstiel incision.  The CT-guided drain was removed and a dressing was placed over the opening.  Patient was then awakened from anesthesia and sent to the postanesthesia care unit in stable condition.  All counts were correct per operating room staff.  An MD/PA assistant was necessary for tissue manipulation, retraction and positioning due to the complexity of the case and hospital policies.

## 2019-03-16 NOTE — Anesthesia Procedure Notes (Signed)
Procedure Name: Intubation Date/Time: 03/16/2019 8:32 AM Performed by: Raenette Rover, CRNA Pre-anesthesia Checklist: Patient identified, Emergency Drugs available, Suction available and Patient being monitored Patient Re-evaluated:Patient Re-evaluated prior to induction Oxygen Delivery Method: Circle system utilized Preoxygenation: Pre-oxygenation with 100% oxygen Induction Type: IV induction Ventilation: Mask ventilation without difficulty Laryngoscope Size: Miller and 3 Grade View: Grade I Tube type: Oral Tube size: 7.5 mm Number of attempts: 1 Airway Equipment and Method: Stylet Placement Confirmation: ETT inserted through vocal cords under direct vision,  positive ETCO2,  CO2 detector and breath sounds checked- equal and bilateral Secured at: 23 cm Tube secured with: Tape Dental Injury: Teeth and Oropharynx as per pre-operative assessment  Comments: Larynx slightly off to the left.

## 2019-03-16 NOTE — Anesthesia Preprocedure Evaluation (Signed)
Anesthesia Evaluation  Patient identified by MRN, date of birth, ID band Patient awake    Reviewed: Allergy & Precautions, NPO status , Patient's Chart, lab work & pertinent test results  Airway Mallampati: II  TM Distance: >3 FB Neck ROM: Full    Dental no notable dental hx.    Pulmonary Current Smoker,    Pulmonary exam normal breath sounds clear to auscultation       Cardiovascular negative cardio ROS Normal cardiovascular exam Rhythm:Regular Rate:Normal     Neuro/Psych negative neurological ROS  negative psych ROS   GI/Hepatic negative GI ROS, Neg liver ROS,   Endo/Other  negative endocrine ROS  Renal/GU negative Renal ROS  negative genitourinary   Musculoskeletal negative musculoskeletal ROS (+)   Abdominal   Peds negative pediatric ROS (+)  Hematology negative hematology ROS (+)   Anesthesia Other Findings   Reproductive/Obstetrics negative OB ROS                             Anesthesia Physical Anesthesia Plan  ASA: II  Anesthesia Plan: General   Post-op Pain Management:    Induction: Intravenous  PONV Risk Score and Plan: 2 and Ondansetron, Dexamethasone and Treatment may vary due to age or medical condition  Airway Management Planned: Oral ETT  Additional Equipment:   Intra-op Plan:   Post-operative Plan: Extubation in OR  Informed Consent: I have reviewed the patients History and Physical, chart, labs and discussed the procedure including the risks, benefits and alternatives for the proposed anesthesia with the patient or authorized representative who has indicated his/her understanding and acceptance.   Dental advisory given  Plan Discussed with: CRNA and Surgeon  Anesthesia Plan Comments:         Anesthesia Quick Evaluation  

## 2019-03-16 NOTE — Anesthesia Postprocedure Evaluation (Signed)
Anesthesia Post Note  Patient: Douglas Weber  Procedure(s) Performed: XI ROBOT ASSISTED SIGMOID COLECTOMY, RIGID PROCTOSCOPY (N/A Abdomen)     Patient location during evaluation: PACU Anesthesia Type: General Level of consciousness: awake and alert Pain management: pain level controlled Vital Signs Assessment: post-procedure vital signs reviewed and stable Respiratory status: spontaneous breathing, nonlabored ventilation, respiratory function stable and patient connected to nasal cannula oxygen Cardiovascular status: blood pressure returned to baseline and stable Postop Assessment: no apparent nausea or vomiting Anesthetic complications: no    Last Vitals:  Vitals:   03/16/19 1316 03/16/19 1416  BP: 132/89 136/77  Pulse: 97 87  Resp: 12 15  Temp: 37.2 C 37.1 C  SpO2: 99% 99%    Last Pain:  Vitals:   03/16/19 1536  TempSrc:   PainSc: 7                  Dustyn Armbrister S

## 2019-03-16 NOTE — Transfer of Care (Signed)
Immediate Anesthesia Transfer of Care Note  Patient: Douglas Weber  Procedure(s) Performed: XI ROBOT ASSISTED SIGMOID COLECTOMY, RIGID PROCTOSCOPY (N/A Abdomen)  Patient Location: PACU  Anesthesia Type:General  Level of Consciousness: awake, alert , oriented and patient cooperative  Airway & Oxygen Therapy: Patient Spontanous Breathing and Patient connected to face mask oxygen  Post-op Assessment: Report given to RN and Post -op Vital signs reviewed and stable  Post vital signs: Reviewed and stable  Last Vitals:  Vitals Value Taken Time  BP 128/84 03/16/19 1141  Temp    Pulse 78 03/16/19 1141  Resp 17 03/16/19 1141  SpO2 100 % 03/16/19 1141  Vitals shown include unvalidated device data.  Last Pain:  Vitals:   03/16/19 0635  TempSrc: Oral         Complications: No apparent anesthesia complications

## 2019-03-17 ENCOUNTER — Telehealth: Payer: Self-pay

## 2019-03-17 LAB — CBC
HCT: 41.8 % (ref 39.0–52.0)
Hemoglobin: 14.5 g/dL (ref 13.0–17.0)
MCH: 32.7 pg (ref 26.0–34.0)
MCHC: 34.7 g/dL (ref 30.0–36.0)
MCV: 94.1 fL (ref 80.0–100.0)
Platelets: 196 10*3/uL (ref 150–400)
RBC: 4.44 MIL/uL (ref 4.22–5.81)
RDW: 13.9 % (ref 11.5–15.5)
WBC: 11.5 10*3/uL — ABNORMAL HIGH (ref 4.0–10.5)
nRBC: 0 % (ref 0.0–0.2)

## 2019-03-17 LAB — BASIC METABOLIC PANEL
Anion gap: 6 (ref 5–15)
BUN: 7 mg/dL (ref 6–20)
CO2: 23 mmol/L (ref 22–32)
Calcium: 8.1 mg/dL — ABNORMAL LOW (ref 8.9–10.3)
Chloride: 105 mmol/L (ref 98–111)
Creatinine, Ser: 0.9 mg/dL (ref 0.61–1.24)
GFR calc Af Amer: >60 mL/min (ref >60)
GFR calc non Af Amer: >60 mL/min (ref >60)
Glucose, Bld: 107 mg/dL — ABNORMAL HIGH (ref 70–99)
Potassium: 3.7 mmol/L (ref 3.5–5.1)
Sodium: 134 mmol/L — ABNORMAL LOW (ref 135–145)

## 2019-03-17 NOTE — Progress Notes (Signed)
1 Day Post-Op robotic sigmoid Subjective: Doing well, pain controlled.  Tolerating liquids. No nausea  Objective: Vital signs in last 24 hours: Temp:  [97.6 F (36.4 C)-99.2 F (37.3 C)] 98 F (36.7 C) (07/16 0548) Pulse Rate:  [71-97] 71 (07/16 0548) Resp:  [9-19] 18 (07/16 0548) BP: (116-140)/(73-89) 116/83 (07/16 0548) SpO2:  [97 %-100 %] 98 % (07/16 0548) Weight:  [76.6 kg] 76.6 kg (07/16 0645)   Intake/Output from previous day: 07/15 0701 - 07/16 0700 In: 3016.4 [P.O.:600; I.V.:2388.3; IV Piggyback:28.1] Out: 1530 [Urine:1480; Blood:50] Intake/Output this shift: No intake/output data recorded.   General appearance: alert and cooperative GI: normal findings: soft, non-tender  Incision: no significant drainage  Lab Results:  Recent Labs    03/17/19 0326  WBC 11.5*  HGB 14.5  HCT 41.8  PLT 196   BMET Recent Labs    03/17/19 0326  NA 134*  K 3.7  CL 105  CO2 23  GLUCOSE 107*  BUN 7  CREATININE 0.90  CALCIUM 8.1*   PT/INR No results for input(s): LABPROT, INR in the last 72 hours. ABG No results for input(s): PHART, HCO3 in the last 72 hours.  Invalid input(s): PCO2, PO2  MEDS, Scheduled . acetaminophen  1,000 mg Oral Q6H  . alvimopan  12 mg Oral BID  . enoxaparin (LOVENOX) injection  40 mg Subcutaneous Q24H  . feeding supplement  237 mL Oral BID BM  . gabapentin  300 mg Oral BID  . saccharomyces boulardii  250 mg Oral BID    Studies/Results: No results found.  Assessment: s/p Procedure(s): XI ROBOT ASSISTED SIGMOID COLECTOMY, RIGID PROCTOSCOPY Patient Active Problem List   Diagnosis Date Noted  . Diverticular disease 03/16/2019  . Diverticulitis of large intestine with perforation and abscess 11/25/2018  . Intraabdominal fluid collection 10/22/2018  . Hypokalemia 07/17/2018  . Intra-abdominal abscess s/p perc drainage 07/15/2018 07/17/2018  . Diverticulitis of large intestine with abscess 07/10/2018    Expected post op  course  Plan: d/c foley Advance diet  Ambulate   LOS: 1 day     .Rosario Adie, [facility], [place] [phone]   03/17/2019 7:40 AM

## 2019-03-17 NOTE — Telephone Encounter (Signed)
Covid-19 screening questions   Do you now or have you had a fever in the last 14 days? No.  Do you have any respiratory symptoms of shortness of breath or cough now or in the last 14 days? No.  Do you have any family members or close contacts with diagnosed or suspected Covid-19 in the past 14 days? No.  Have you been tested for Covid-19 and found to be positive? No.       Follow up Call-  Call back number 03/15/2019  Post procedure Call Back phone  # [phone]  Permission to leave phone message Yes  Some recent data might be hidden     Patient questions:  Do you have a fever, pain , or abdominal swelling? No. Pain Score  0 *  Have you tolerated food without any problems? Yes.    Have you been able to return to your normal activities? Yes.    Do you have any questions about your discharge instructions: Diet   No. Medications  No. Follow up visit  No.  Do you have questions or concerns about your Care? No.  Actions: * If pain score is 4 or above: No action needed, pain <4.  Pt. In hospital, reports he is recovering well from his surgery yesterday.

## 2019-03-18 LAB — BASIC METABOLIC PANEL
Anion gap: 9 (ref 5–15)
BUN: 5 mg/dL — ABNORMAL LOW (ref 6–20)
CO2: 24 mmol/L (ref 22–32)
Calcium: 8.4 mg/dL — ABNORMAL LOW (ref 8.9–10.3)
Chloride: 104 mmol/L (ref 98–111)
Creatinine, Ser: 0.9 mg/dL (ref 0.61–1.24)
GFR calc Af Amer: >60 mL/min (ref >60)
GFR calc non Af Amer: >60 mL/min (ref >60)
Glucose, Bld: 93 mg/dL (ref 70–99)
Potassium: 3.7 mmol/L (ref 3.5–5.1)
Sodium: 137 mmol/L (ref 135–145)

## 2019-03-18 LAB — CBC
HCT: 40.3 % (ref 39.0–52.0)
Hemoglobin: 13.7 g/dL (ref 13.0–17.0)
MCH: 32.2 pg (ref 26.0–34.0)
MCHC: 34 g/dL (ref 30.0–36.0)
MCV: 94.8 fL (ref 80.0–100.0)
Platelets: 196 10*3/uL (ref 150–400)
RBC: 4.25 MIL/uL (ref 4.22–5.81)
RDW: 14.1 % (ref 11.5–15.5)
WBC: 9.8 10*3/uL (ref 4.0–10.5)
nRBC: 0 % (ref 0.0–0.2)

## 2019-03-18 MED ORDER — TRAMADOL HCL 50 MG PO TABS: 50.0000 mg | ORAL_TABLET | Freq: Four times a day (QID) | ORAL | 0 refills | Status: DC | PRN

## 2019-03-18 NOTE — Progress Notes (Signed)
Discharge instructions given to pt and all questions were answered.  

## 2019-03-18 NOTE — Discharge Instructions (Signed)

## 2019-03-18 NOTE — Discharge Summary (Signed)
Physician Discharge Summary  Patient ID: Douglas Weber MRN: [id number] DOB/AGE: 09/28/1970 48 y.o.  Admit date: 03/16/2019 Discharge date: 03/18/2019  Admission Diagnoses: Diverticular Fistula  Discharge Diagnoses:  Active Problems:   Diverticular disease   Discharged Condition: good  Hospital Course: Patient was admitted after surgery.  His diet was advanced as tolerated.  His Foley catheter was removed by postop day 1.  By postop day 2 the patient was ambulating without difficulty and tolerating a diet.  He was having good bowel function and good pain control.  It was felt that he was in stable condition for discharge to home.  Consults: None  Significant Diagnostic Studies: labs: cbc, bmet   Treatments: IV hydration and surgery: robotic sigmoidectomy  Discharge Exam: Blood pressure 127/82, pulse 71, temperature 99.1 F (37.3 C), temperature source Oral, resp. rate 18, height 5\' 8"  (1.727 m), weight 76.6 kg, SpO2 98 %. General appearance: alert and cooperative GI: normal findings: soft, non-tender Incision/Wound: clean, dry, intact  Disposition: home   Allergies as of 03/18/2019   No Known Allergies     Medication List    TAKE these medications   acetaminophen 325 MG tablet Commonly known as: TYLENOL Take 2 tablets (650 mg total) by mouth every 6 (six) hours as needed for mild pain (or temp > 100). What changed: Another medication with the same name was removed. Continue taking this medication, and follow the directions you see here.   polyethylene glycol 17 g packet Commonly known as: MIRALAX / GLYCOLAX Take 17 g by mouth daily.   traMADol 50 MG tablet Commonly known as: ULTRAM Take 1 tablet (50 mg total) by mouth every 6 (six) hours as needed (mild pain).      Follow-up Information    Leighton Ruff, MD. Schedule an appointment as soon as possible for a visit in 2 week(s).   Specialty: General Surgery Contact information: [address] [place] [address] [phone]           Signed: Rosario Adie 02/15/736, 8:00 AM

## 2019-08-19 IMAGING — CT CT IMAGE GUIDED DRAINAGE BY PERCUTANEOUS CATHETER
2 of 6 series · 12 of 32 positions shown, 18 images · non-contrast
Comparison: none

INDICATION: 47-year-old with recurrent colonic diverticular abscesses. Recent CT
demonstrated abscesses in the left lower quadrant and left upper
quadrant. Plan for CT-guided drain placements.
TECHNIQUE: Informed written consent was obtained from the patient after a
thorough discussion of the procedural risks, benefits and
alternatives. All questions were addressed. Maximal Sterile Barrier
Technique was utilized including caps, mask, sterile gowns, sterile
gloves, sterile drape, hand hygiene and skin antiseptic. A timeout
was performed prior to the initiation of the procedure.

[Series 2: i-spiral 5.0 b31f · axial · 0.94mm/px · z∈[+1071,+1260]mm · 10 of 67 slices shown, 16 images (1 of 2)]
[im 7/67  soft-tissue]
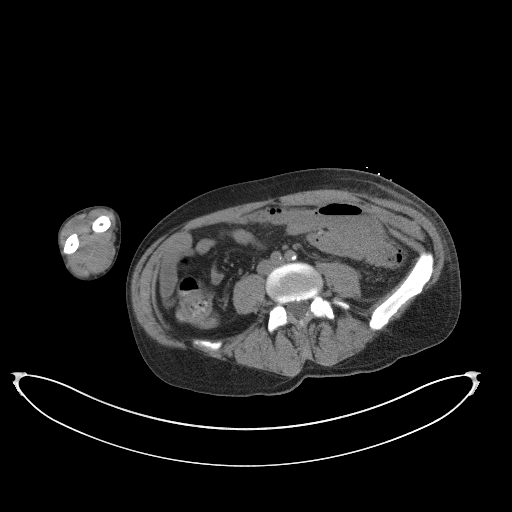
[im 7/67  bone]
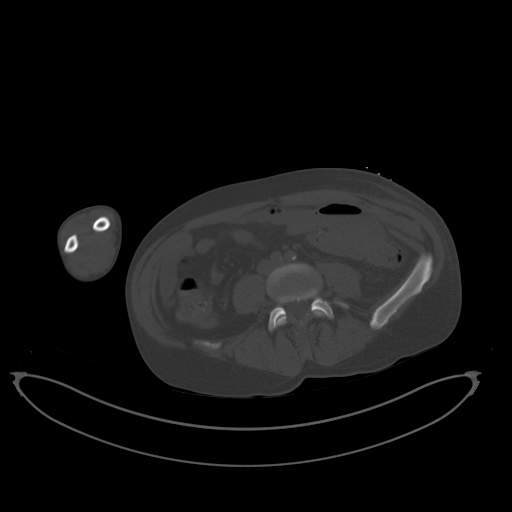
[im 13/67  soft-tissue]
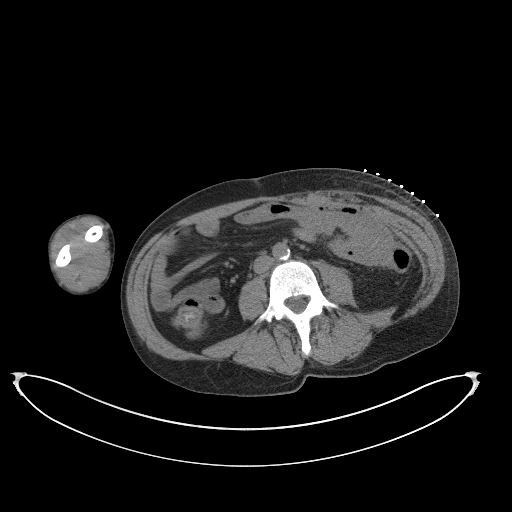
[im 19/67  soft-tissue]
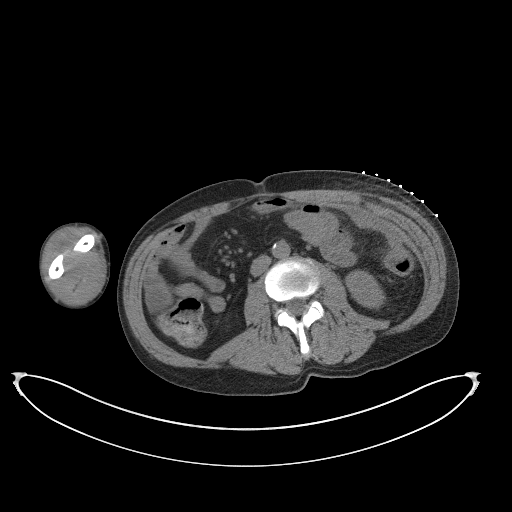
[im 25/67  soft-tissue]
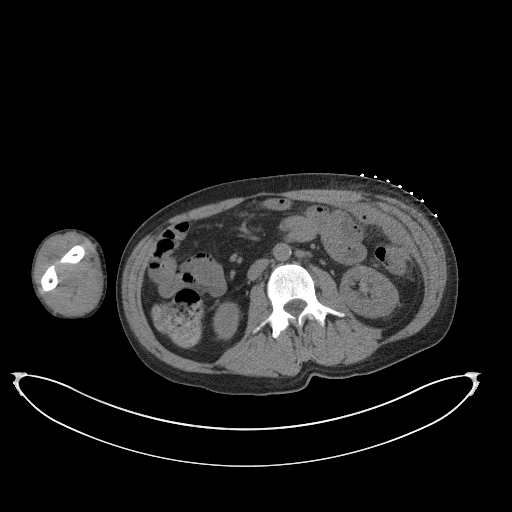
[im 31/67  soft-tissue]
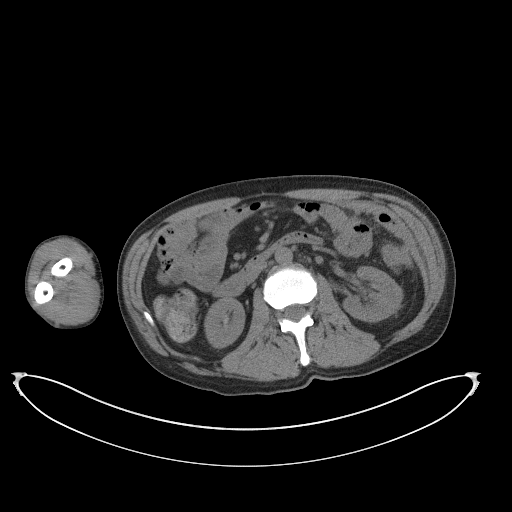
[im 37/67  soft-tissue]
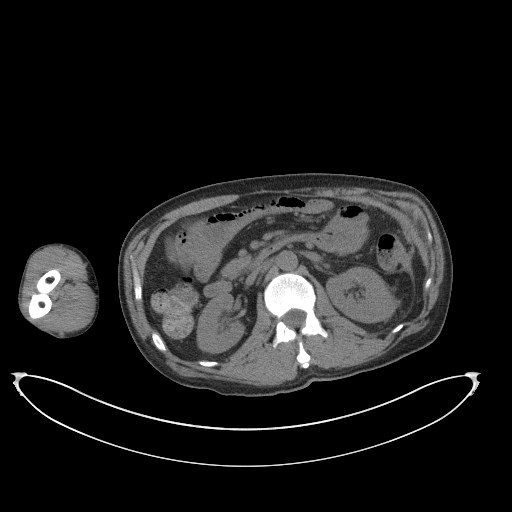
[im 43/67  soft-tissue]
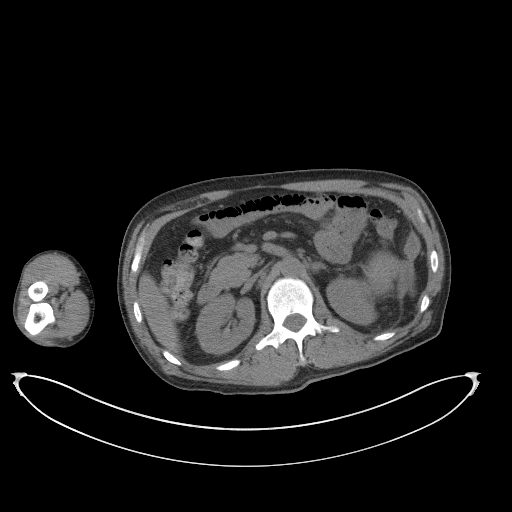
[im 43/67  lung]
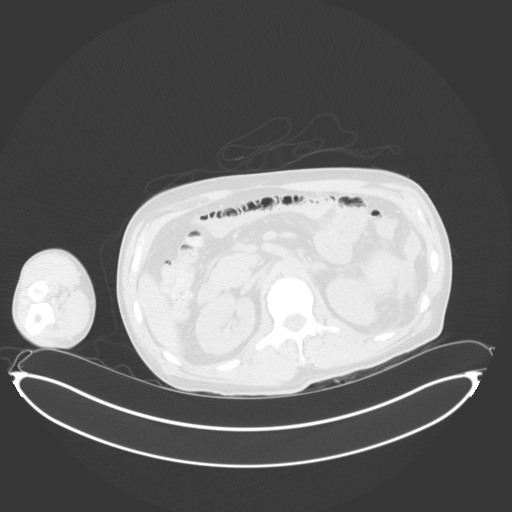
[im 49/67  soft-tissue]
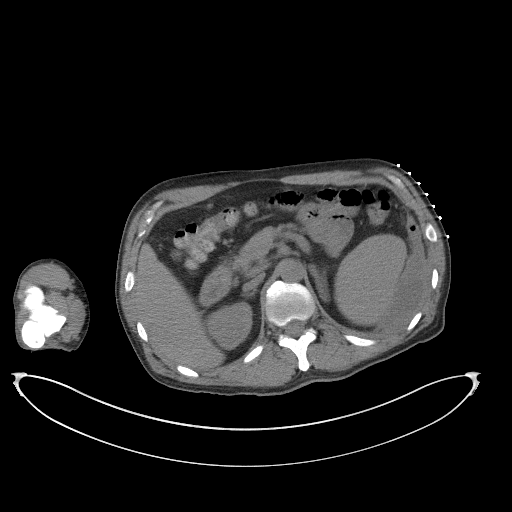
[im 49/67  lung]
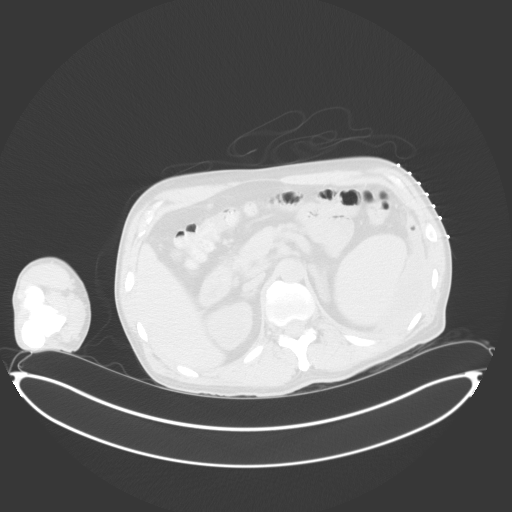
[im 55/67  soft-tissue]
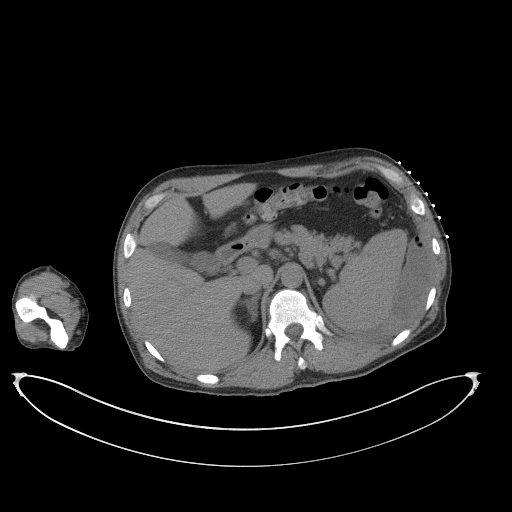
[im 55/67  lung]
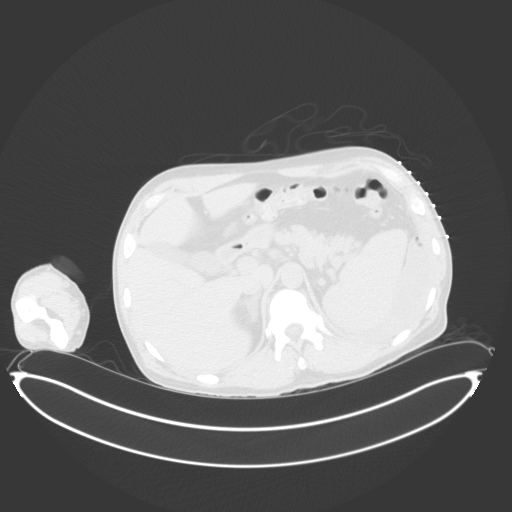
[im 55/67  bone]
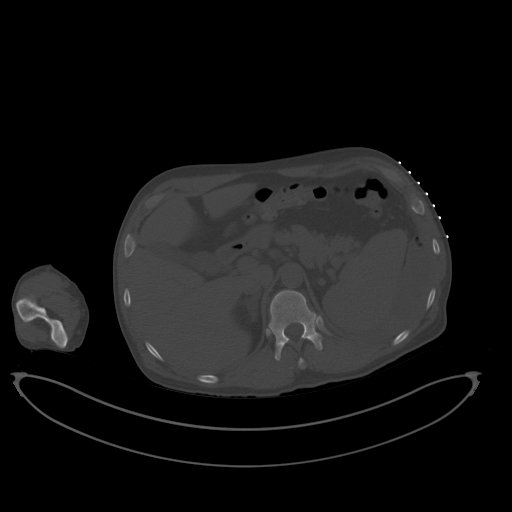
[im 61/67  soft-tissue]
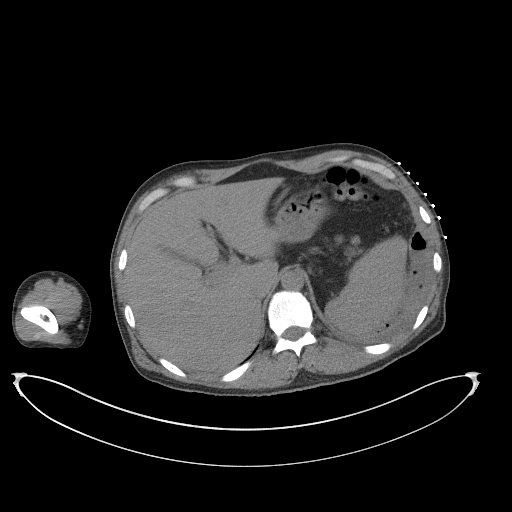
[im 61/67  lung]
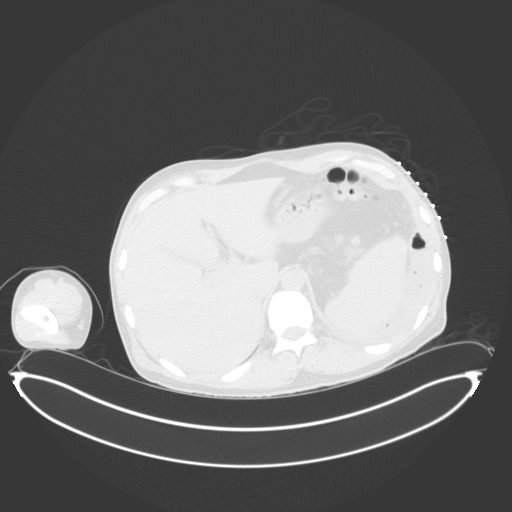

[Series 5: i-spiral 5.0 b31f · axial · 0.94mm/px · z∈[+1062,+1083]mm · 2 of 30 slices shown (2 of 2)]
[im 6/30  soft-tissue]
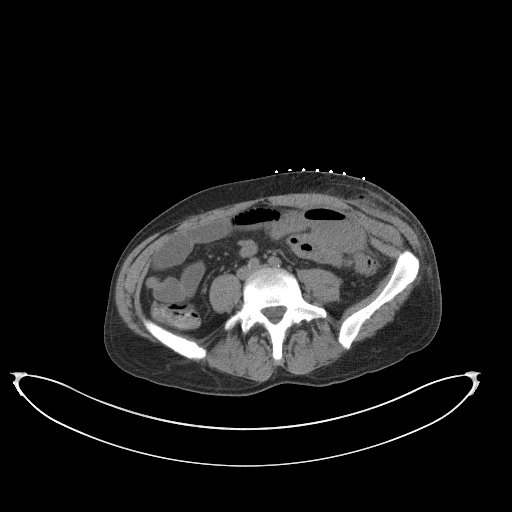
[im 12/30  soft-tissue]
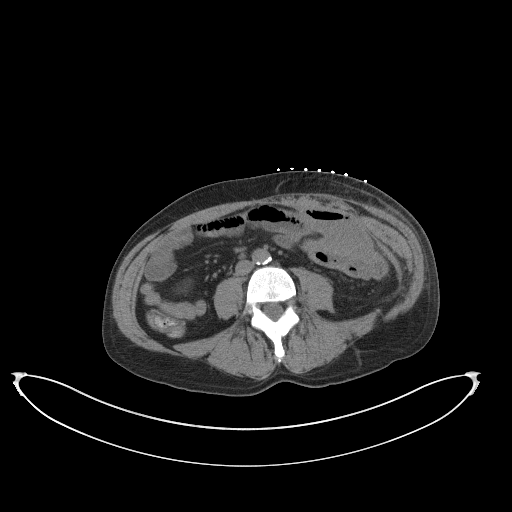

[12 of 32 positions shown; findings below may reference images not displayed]

EXAM:
CT GUIDED DRAINAGE OF LEFT UPPER ABDOMINAL ABSCESS

CT-GUIDED DRAINAGE OF LEFT LOWER ABDOMINAL ABSCESS

MEDICATIONS:
The patient is currently admitted to the hospital and receiving
intravenous antibiotics.

ANESTHESIA/SEDATION:
3.0 mg IV Versed 150 mcg IV Fentanyl

Moderate Sedation Time:  52 minutes

The patient was continuously monitored during the procedure by the
interventional radiology nurse under my direct supervision.

COMPLICATIONS:
None immediate.
PROCEDURE:
Patient was placed supine on the CT scanner. Images through the
abdomen were obtained. Left side of the abdomen was shaved. Left
upper quadrant abscess was initially targeted. The left lateral
abdomen was prepped with chlorhexidine and sterile field was
created. Skin and soft tissues anesthetized with 1% lidocaine. 18
gauge trocar needle directed into the left upper quadrant abscess
with CT guidance. Yellow purulent fluid was aspirated. Stiff Amplatz
wire was advanced into the collection and the tract was dilated to
accommodate a 10.2 French multipurpose drain. Approximately 30 mL of
bloody purulent fluid was aspirated from the left upper quadrant
abscess. Follow up CT images were obtained. Catheter was sutured to
skin and attached to a suction bulb.

Attention was directed to the left lower quadrant abscess.
Additional images of the left lower quadrant were obtained. Left
lower quadrant was prepped with chlorhexidine and sterile field was
created. Skin was anesthetized medial to the small cutaneous wound.
18 gauge trocar needle was directed into the left lower quadrant
abscess with CT guidance. Yellow purulent fluid was aspirated. Stiff
Amplatz wire was advanced into the collection. Tract was dilated to
accommodate a 10.2 French multipurpose drain. Approximately 40 mL
yellow purulent fluid was removed from the left lower quadrant
abscess. Follow up CT images were obtained. Catheter was sutured to
skin and attached to a suction bulb.
FINDINGS: 30 mL of bloody purulent fluid removed from the left upper quadrant
abscess.

40 mL yellow purulent fluid was removed from the left lower quadrant
abscess. Left lower quadrant drain is just medial to the cutaneous
wound and small subcutaneous collection.
IMPRESSION: CT-guided placement of a drainage catheter in the left upper
quadrant abscess.

CT-guided placement of a drainage catheter in the left lower
quadrant abscess.

Specimens from both abscess collections were sent for culture.

## 2020-09-15 ENCOUNTER — Emergency Department (HOSPITAL_COMMUNITY)
Admission: EM | Admit: 2020-09-15 | Discharge: 2020-09-15 | Disposition: A | Discharge status: Home / Self Care | Payer: BC Managed Care – PPO | Attending: Emergency Medicine | Admitting: Emergency Medicine

## 2020-09-15 ENCOUNTER — Encounter (HOSPITAL_COMMUNITY): Payer: Self-pay | Admitting: Emergency Medicine

## 2020-09-15 ENCOUNTER — Other Ambulatory Visit: Payer: Self-pay

## 2020-09-15 ENCOUNTER — Emergency Department (HOSPITAL_COMMUNITY): Payer: BC Managed Care – PPO

## 2020-09-15 DIAGNOSIS — R079 Chest pain, unspecified: Secondary | ICD-10-CM

## 2020-09-15 DIAGNOSIS — R072 Precordial pain: Secondary | ICD-10-CM | POA: Diagnosis present

## 2020-09-15 DIAGNOSIS — J209 Acute bronchitis, unspecified: Secondary | ICD-10-CM | POA: Insufficient documentation

## 2020-09-15 DIAGNOSIS — Z20822 Contact with and (suspected) exposure to covid-19: Secondary | ICD-10-CM | POA: Diagnosis not present

## 2020-09-15 DIAGNOSIS — I10 Essential (primary) hypertension: Secondary | ICD-10-CM | POA: Insufficient documentation

## 2020-09-15 DIAGNOSIS — R0981 Nasal congestion: Secondary | ICD-10-CM | POA: Diagnosis not present

## 2020-09-15 DIAGNOSIS — F17210 Nicotine dependence, cigarettes, uncomplicated: Secondary | ICD-10-CM | POA: Insufficient documentation

## 2020-09-15 LAB — BASIC METABOLIC PANEL
Anion gap: 9 (ref 5–15)
BUN: 9 mg/dL (ref 6–20)
CO2: 27 mmol/L (ref 22–32)
Calcium: 9.5 mg/dL (ref 8.9–10.3)
Chloride: 101 mmol/L (ref 98–111)
Creatinine, Ser: 1.29 mg/dL — ABNORMAL HIGH (ref 0.61–1.24)
GFR, Estimated: >60 mL/min (ref >60)
Glucose, Bld: 111 mg/dL — ABNORMAL HIGH (ref 70–99)
Potassium: 4.4 mmol/L (ref 3.5–5.1)
Sodium: 137 mmol/L (ref 135–145)

## 2020-09-15 LAB — CBC
HCT: 53.2 % — ABNORMAL HIGH (ref 39.0–52.0)
Hemoglobin: 18.7 g/dL — ABNORMAL HIGH (ref 13.0–17.0)
MCH: 34.8 pg — ABNORMAL HIGH (ref 26.0–34.0)
MCHC: 35.2 g/dL (ref 30.0–36.0)
MCV: 99.1 fL (ref 80.0–100.0)
Platelets: 228 10*3/uL (ref 150–400)
RBC: 5.37 MIL/uL (ref 4.22–5.81)
RDW: 12.4 % (ref 11.5–15.5)
WBC: 7 10*3/uL (ref 4.0–10.5)
nRBC: 0 % (ref 0.0–0.2)

## 2020-09-15 LAB — TROPONIN I (HIGH SENSITIVITY)
Troponin I (High Sensitivity): 3 ng/L (ref <18)
Troponin I (High Sensitivity): 3 ng/L (ref <18)

## 2020-09-15 MED ORDER — AZITHROMYCIN 250 MG PO TABS: 250.0000 mg | ORAL_TABLET | Freq: Every day | ORAL | 0 refills | Status: AC

## 2020-09-15 NOTE — ED Provider Notes (Signed)
[facility] DEPT Provider Note   CSN: [id number] Arrival date & time: 09/15/20  1312     History Chief Complaint  Patient presents with  . Chest Pain    Douglas Weber is a 50 y.o. male.  He is complaining of 4 to 5 days of some chest congestion and low-grade fever, some nasal congestion.  Nonproductive cough.  No vomiting or diarrhea.  Has a good appetite.  Associated with some sharp left sternal border chest pain.  Worse with twisting and turning.  Not short of breath.  The history is provided by the patient.  Chest Pain Pain location:  Substernal area Pain quality: sharp   Pain radiates to:  Does not radiate Pain severity:  Moderate Onset quality:  Gradual Timing:  Sporadic Progression:  Unchanged Chronicity:  New Context: movement   Relieved by:  None tried Worsened by:  Certain positions Ineffective treatments:  None tried Associated symptoms: cough and fever   Associated symptoms: no abdominal pain, no back pain, no diaphoresis, no dizziness, no nausea, no shortness of breath and no vomiting   Risk factors: smoking        Past Medical History:  Diagnosis Date  . Diverticulosis   . History of kidney stones    passed independently   . History of MRSA infection    arms    Patient Active Problem List   Diagnosis Date Noted  . Diverticular disease 03/16/2019  . Diverticulitis of large intestine with perforation and abscess 11/25/2018  . Intraabdominal fluid collection 10/22/2018  . Hypokalemia 07/17/2018  . Intra-abdominal abscess s/p perc drainage 07/15/2018 07/17/2018  . Diverticulitis of large intestine with abscess 07/10/2018    Past Surgical History:  Procedure Laterality Date  . IR CATHETER TUBE CHANGE  11/19/2018  . IR CATHETER TUBE CHANGE  11/26/2018  . IR RADIOLOGIST EVAL & MGMT  07/28/2018  . IR RADIOLOGIST EVAL & MGMT  08/12/2018  . IR RADIOLOGIST EVAL & MGMT  11/03/2018  . IR RADIOLOGIST EVAL & MGMT  11/17/2018  .  IR SINUS/FIST TUBE CHK-NON GI  11/19/2018  . KNEE ARTHROSCOPY Left in highschool  . TONSILLECTOMY     childhood        Family History  Problem Relation Age of Onset  . Stomach cancer Neg Hx   . Colon cancer Neg Hx   . Rectal cancer Neg Hx   . Pancreatic cancer Neg Hx     Social History   Tobacco Use  . Smoking status: Current Some Day Smoker    Years: 10.00    Types: Cigarettes  . Smokeless tobacco: Never Used  . Tobacco comment: 10 cigarettes a day   Vaping Use  . Vaping Use: Never used  Substance Use Topics  . Alcohol use: Yes    Alcohol/week: 2.0 - 3.0 standard drinks    Types: 2 - 3 Glasses of wine per week  . Drug use: Never    Home Medications Prior to Admission medications   Medication Sig Start Date End Date Taking? Authorizing Provider  acetaminophen (TYLENOL) 325 MG tablet Take 2 tablets (650 mg total) by mouth every 6 (six) hours as needed for mild pain (or temp > 100). 10/26/18  Yes Meuth, Brooke A, PA-C  carbamide peroxide (DEBROX) 6.5 % OTIC solution Place 5 drops into both ears 2 (two) times daily as needed (ear pain).   Yes [provider]    Allergies    Patient has no known allergies.  Review of Systems   Review of Systems  Constitutional: Positive for fever. Negative for diaphoresis.  HENT: Positive for rhinorrhea. Negative for sore throat.   Eyes: Negative for visual disturbance.  Respiratory: Positive for cough. Negative for shortness of breath.   Cardiovascular: Positive for chest pain.  Gastrointestinal: Negative for abdominal pain, nausea and vomiting.  Genitourinary: Negative for dysuria.  Musculoskeletal: Negative for back pain.  Skin: Negative for rash.  Neurological: Negative for dizziness.    Physical Exam Updated Vital Signs BP (!) 162/99 (BP Location: Left Arm)   Pulse 89   Temp 99.7 F (37.6 C) (Oral)   Resp 18   Ht 5\' 9"  (1.753 m)   Wt 86.2 kg   SpO2 94%   BMI 28.06 kg/m   Physical Exam Vitals and nursing  note reviewed.  Constitutional:      Appearance: He is well-developed and well-nourished.  HENT:     Head: Normocephalic and atraumatic.  Eyes:     Conjunctiva/sclera: Conjunctivae normal.  Cardiovascular:     Rate and Rhythm: Normal rate and regular rhythm.     Heart sounds: Normal heart sounds. No murmur heard.   Pulmonary:     Effort: Pulmonary effort is normal. No respiratory distress.     Breath sounds: Normal breath sounds.  Chest:     Chest wall: No tenderness or crepitus.  Abdominal:     Palpations: Abdomen is soft.     Tenderness: There is no abdominal tenderness.  Musculoskeletal:        General: No edema. Normal range of motion.     Cervical back: Neck supple.     Right lower leg: No tenderness. No edema.     Left lower leg: No tenderness. No edema.  Skin:    General: Skin is warm and dry.     Capillary Refill: Capillary refill takes less than 2 seconds.  Neurological:     General: No focal deficit present.     Mental Status: He is alert.  Psychiatric:        Mood and Affect: Mood and affect normal.     ED Results / Procedures / Treatments   Labs (all labs ordered are listed, but only abnormal results are displayed) Labs Reviewed  BASIC METABOLIC PANEL - Abnormal; Notable for the following components:      Result Value   Glucose, Bld 111 (*)    Creatinine, Ser 1.29 (*)    All other components within normal limits  CBC - Abnormal; Notable for the following components:   Hemoglobin 18.7 (*)    HCT 53.2 (*)    MCH 34.8 (*)    All other components within normal limits  SARS CORONAVIRUS 2 (TAT 6-24 HRS)  TROPONIN I (HIGH SENSITIVITY)  TROPONIN I (HIGH SENSITIVITY)    EKG EKG Interpretation  Date/Time:  Saturday September 15 2020 13:22:33 EST Ventricular Rate:  105 PR Interval:    QRS Duration: 91 QT Interval:  328 QTC Calculation: 434 R Axis:   75 Text Interpretation: Sinus tachycardia Probable left atrial enlargement rate increased from prior 3/20  Confirmed by Aletta Edouard [phone]) on 09/15/2020 3:44:38 PM   Radiology DG Chest 2 View  Result Date: 09/15/2020 CLINICAL DATA:  Patient c/o left side chest pain x4 days accompanied by fever and congestion in the beginning of the week. Reports negative rapid Covid test. EXAM: CHEST - 2 VIEW COMPARISON:  Chest radiograph 11/25/2018 FINDINGS: The cardiomediastinal contours are within normal limits. The lungs are  clear. No pneumothorax or pleural effusion. No acute finding in the visualized skeleton. IMPRESSION: No evidence of active disease. Electronically Signed   By: Audie Pinto M.D.   On: 09/15/2020 14:04    Procedures Procedures (including critical care time)  Medications Ordered in ED Medications - No data to display  ED Course  I have reviewed the triage vital signs and the nursing notes.  Pertinent labs & imaging results that were available during my care of the patient were reviewed by me and considered in my medical decision making (see chart for details).  Clinical Course as of 09/15/20 1602  Sat Sep 15, 2020  1555 Chest x-ray interpreted by me as no acute infiltrates. [MB]    Clinical Course User Index [MB] Hayden Rasmussen, MD   MDM Rules/Calculators/A&P                         Douglas Weber was evaluated in Emergency Department on 09/15/2020 for the symptoms described in the history of present illness. He was evaluated in the context of the global COVID-19 pandemic, which necessitated consideration that the patient might be at risk for infection with the SARS-CoV-2 virus that causes COVID-19. Institutional protocols and algorithms that pertain to the evaluation of patients at risk for COVID-19 are in a state of rapid change based on information released by regulatory bodies including the CDC and federal and state organizations. These policies and algorithms were followed during the patient's care in the ED.  This patient complains of sharp positional chest pain in  the setting of chest congestion nasal congestion; this involves an extensive number of treatment Options and is a complaint that carries with it a high risk of complications and Morbidity. The differential includes COVID, pneumonia, ACS, PE, pneumothorax, reflux, musculoskeletal  I ordered, reviewed and interpreted labs, which included CBC with normal white count, hemoglobin elevated, chemistries normal other than mild elevation of creatinine, troponins flat, COVID testing negative I ordered imaging studies which included chest x-ray and I independently    visualized and interpreted imaging which showed no acute infiltrates Previous records obtained and reviewed in epic, no recent admissions  After the interventions stated above, I reevaluated the patient and found patient to be hemodynamically stable.  Blood pressure is elevated here.  Recommended close follow-up with PCP regarding blood pressure control.  Recommended symptomatic treatment with anti-inflammatories.  Return instructions discussed.   Final Clinical Impression(s) / ED Diagnoses Final diagnoses:  Nonspecific chest pain  Hypertension, unspecified type  Acute bronchitis, unspecified organism  Person under investigation for COVID-19    Rx / DC Orders ED Discharge Orders         Ordered    azithromycin (ZITHROMAX) 250 MG tablet  Daily        09/15/20 1753           Hayden Rasmussen, MD 09/16/20 0930

## 2020-09-15 NOTE — ED Triage Notes (Signed)
Patient c/o left side chest pain x4 days accompanied by fever and congestion in the beginning of the week. Reports negative rapid Covid test. Seen at [facility] and sent for further evaluation.

## 2020-09-15 NOTE — Discharge Instructions (Addendum)
You were seen in the emergency department for cough congestion and chest pain.  You had blood work EKG and a chest x-ray that did not show any serious findings.  Your COVID test is pending at time of discharge.  Please follow this up in [place].  We are starting you on some antibiotics for possible bronchitis.  Please follow-up with your primary care doctor.  If you test COVID-positive you will need to isolate for 10 days from symptom onset.

## 2020-09-16 LAB — SARS CORONAVIRUS 2 (TAT 6-24 HRS): SARS Coronavirus 2: NEGATIVE

## 2022-01-24 ENCOUNTER — Other Ambulatory Visit: Payer: Self-pay

## 2022-01-24 ENCOUNTER — Encounter (HOSPITAL_BASED_OUTPATIENT_CLINIC_OR_DEPARTMENT_OTHER): Payer: Self-pay

## 2022-01-24 ENCOUNTER — Emergency Department (HOSPITAL_BASED_OUTPATIENT_CLINIC_OR_DEPARTMENT_OTHER)
Admission: EM | Admit: 2022-01-24 | Discharge: 2022-01-24 | Disposition: A | Discharge status: Home / Self Care | Payer: BC Managed Care – PPO | Attending: Emergency Medicine | Admitting: Emergency Medicine

## 2022-01-24 ENCOUNTER — Emergency Department (HOSPITAL_BASED_OUTPATIENT_CLINIC_OR_DEPARTMENT_OTHER): Payer: BC Managed Care – PPO

## 2022-01-24 DIAGNOSIS — M545 Low back pain, unspecified: Secondary | ICD-10-CM | POA: Diagnosis present

## 2022-01-24 DIAGNOSIS — R109 Unspecified abdominal pain: Secondary | ICD-10-CM | POA: Insufficient documentation

## 2022-01-24 LAB — COMPREHENSIVE METABOLIC PANEL
ALT: 49 U/L — ABNORMAL HIGH (ref 0–44)
AST: 29 U/L (ref 15–41)
Albumin: 4.4 g/dL (ref 3.5–5.0)
Alkaline Phosphatase: 49 U/L (ref 38–126)
Anion gap: 9 (ref 5–15)
BUN: 10 mg/dL (ref 6–20)
CO2: 26 mmol/L (ref 22–32)
Calcium: 9.1 mg/dL (ref 8.9–10.3)
Chloride: 100 mmol/L (ref 98–111)
Creatinine, Ser: 1.06 mg/dL (ref 0.61–1.24)
GFR, Estimated: >60 mL/min (ref >60)
Glucose, Bld: 105 mg/dL — ABNORMAL HIGH (ref 70–99)
Potassium: 4.1 mmol/L (ref 3.5–5.1)
Sodium: 135 mmol/L (ref 135–145)
Total Bilirubin: 0.6 mg/dL (ref 0.3–1.2)
Total Protein: 6.9 g/dL (ref 6.5–8.1)

## 2022-01-24 LAB — CBC WITH DIFFERENTIAL/PLATELET
Abs Immature Granulocytes: 0.02 10*3/uL (ref 0.00–0.07)
Basophils Absolute: 0 10*3/uL (ref 0.0–0.1)
Basophils Relative: 1 %
Eosinophils Absolute: 0.1 10*3/uL (ref 0.0–0.5)
Eosinophils Relative: 1 %
HCT: 50.3 % (ref 39.0–52.0)
Hemoglobin: 17.7 g/dL — ABNORMAL HIGH (ref 13.0–17.0)
Immature Granulocytes: 0 %
Lymphocytes Relative: 26 %
Lymphs Abs: 1.2 10*3/uL (ref 0.7–4.0)
MCH: 34.2 pg — ABNORMAL HIGH (ref 26.0–34.0)
MCHC: 35.2 g/dL (ref 30.0–36.0)
MCV: 97.1 fL (ref 80.0–100.0)
Monocytes Absolute: 0.3 10*3/uL (ref 0.1–1.0)
Monocytes Relative: 7 %
Neutro Abs: 3 10*3/uL (ref 1.7–7.7)
Neutrophils Relative %: 65 %
Platelets: 191 10*3/uL (ref 150–400)
RBC: 5.18 MIL/uL (ref 4.22–5.81)
RDW: 12.7 % (ref 11.5–15.5)
WBC: 4.6 10*3/uL (ref 4.0–10.5)
nRBC: 0 % (ref 0.0–0.2)

## 2022-01-24 LAB — URINALYSIS, ROUTINE W REFLEX MICROSCOPIC
Bilirubin Urine: NEGATIVE
Glucose, UA: NEGATIVE mg/dL
Hgb urine dipstick: NEGATIVE
Leukocytes,Ua: NEGATIVE
Nitrite: NEGATIVE
Specific Gravity, Urine: 1.027 (ref 1.005–1.030)
pH: 5.5 (ref 5.0–8.0)

## 2022-01-24 MED ORDER — IOHEXOL 300 MG/ML  SOLN
100.0000 mL | Freq: Once | INTRAMUSCULAR | Status: AC | PRN
  Administered 2022-01-24: 100 mL via INTRAVENOUS

## 2022-01-24 MED ORDER — KETOROLAC TROMETHAMINE 15 MG/ML IJ SOLN
15.0000 mg | Freq: Once | INTRAMUSCULAR | Status: AC
  Administered 2022-01-24: 15 mg via INTRAVENOUS
  Filled 2022-01-24: qty 1

## 2022-01-24 NOTE — ED Provider Notes (Signed)
[facility] EMERGENCY DEPT Provider Note   CSN: [id number] Arrival date & time: 01/24/22  0801     History  Chief Complaint  Patient presents with   Back Pain    Douglas Weber is a 51 y.o. male.  Presents to the emergency department due to concern for back pain.  Low back pain, ongoing for about a week, says that initially it was only mild but yesterday was quite severe.  Today is more moderate.  Has not had any pain with urination or burning with urination.  Pain seems to be kind of across his lumbar region, not particularly one-sided.  No nausea or vomiting.  States that this reminds him of prior kidney stones.  No blood in urine.  Denies any major medical problems.  No known trauma.  HPI     Home Medications Prior to Admission medications   Medication Sig Start Date End Date Taking? Authorizing Provider  acetaminophen (TYLENOL) 325 MG tablet Take 2 tablets (650 mg total) by mouth every 6 (six) hours as needed for mild pain (or temp > 100). 10/26/18   Meuth, Brooke A, PA-C  azithromycin (ZITHROMAX) 250 MG tablet Take 1 tablet (250 mg total) by mouth daily. Take first 2 tablets together, then 1 every day until finished. 09/15/20   Hayden Rasmussen, MD  carbamide peroxide (DEBROX) 6.5 % OTIC solution Place 5 drops into both ears 2 (two) times daily as needed (ear pain).    [provider]      Allergies    Patient has no known allergies.    Review of Systems   Review of Systems  Constitutional:  Negative for chills and fever.  HENT:  Negative for ear pain and sore throat.   Eyes:  Negative for pain and visual disturbance.  Respiratory:  Negative for cough and shortness of breath.   Cardiovascular:  Negative for chest pain and palpitations.  Gastrointestinal:  Negative for abdominal pain and vomiting.  Genitourinary:  Negative for dysuria and hematuria.  Musculoskeletal:  Positive for back pain. Negative for arthralgias.  Skin:  Negative for color  change and rash.  Neurological:  Negative for seizures and syncope.  All other systems reviewed and are negative.  Physical Exam Updated Vital Signs BP (!) 148/94   Pulse 73   Temp 98.3 F (36.8 C) (Oral)   Resp 16   Ht '5\' 9"'$  (1.753 m)   Wt 86.2 kg   SpO2 96%   BMI 28.06 kg/m  Physical Exam Vitals and nursing note reviewed.  Constitutional:      General: He is not in acute distress.    Appearance: He is well-developed.  HENT:     Head: Normocephalic and atraumatic.  Eyes:     Conjunctiva/sclera: Conjunctivae normal.  Cardiovascular:     Rate and Rhythm: Normal rate and regular rhythm.     Heart sounds: No murmur heard. Pulmonary:     Effort: Pulmonary effort is normal. No respiratory distress.     Breath sounds: Normal breath sounds.  Abdominal:     Palpations: Abdomen is soft.     Tenderness: There is no abdominal tenderness.  Musculoskeletal:        General: No swelling.     Cervical back: Neck supple.     Comments: Generalized tenderness across the lumbar region, there is no step-off or deformity  Skin:    General: Skin is warm and dry.     Capillary Refill: Capillary refill takes less than  2 seconds.  Neurological:     Mental Status: He is alert.  Psychiatric:        Mood and Affect: Mood normal.    ED Results / Procedures / Treatments   Labs (all labs ordered are listed, but only abnormal results are displayed) Labs Reviewed  CBC WITH DIFFERENTIAL/PLATELET - Abnormal; Notable for the following components:      Result Value   Hemoglobin 17.7 (*)    MCH 34.2 (*)    All other components within normal limits  COMPREHENSIVE METABOLIC PANEL - Abnormal; Notable for the following components:   Glucose, Bld 105 (*)    ALT 49 (*)    All other components within normal limits  URINALYSIS, ROUTINE W REFLEX MICROSCOPIC - Abnormal; Notable for the following components:   Ketones, ur TRACE (*)    Protein, ur TRACE (*)    All other components within normal limits     EKG None  Radiology CT ABDOMEN PELVIS W CONTRAST  Result Date: 01/24/2022 CLINICAL DATA:  Abdominal pain EXAM: CT ABDOMEN AND PELVIS WITH CONTRAST TECHNIQUE: Multidetector CT imaging of the abdomen and pelvis was performed using the standard protocol following bolus administration of intravenous contrast. RADIATION DOSE REDUCTION: This exam was performed according to the departmental dose-optimization program which includes automated exposure control, adjustment of the mA and/or kV according to patient size and/or use of iterative reconstruction technique. CONTRAST:  157m OMNIPAQUE IOHEXOL 300 MG/ML  SOLN COMPARISON:  CT abdomen and pelvis dated November 25, 2018 FINDINGS: Lower chest: No acute abnormality. Hepatobiliary: No focal liver abnormality is seen. No gallstones, gallbladder wall thickening, or biliary dilatation. Pancreas: Unremarkable. No pancreatic ductal dilatation or surrounding inflammatory changes. Spleen: Normal in size without focal abnormality. Adrenals/Urinary Tract: Adrenal glands are unremarkable. Kidneys are normal, without renal calculi, focal lesion, or hydronephrosis. Bladder is unremarkable. Stomach/Bowel: Prior partial sigmoidectomy. Stomach is normal in appearance. Normal appendix. No bowel wall thickening, inflammatory change or evidence of obstruction. Vascular/Lymphatic: No significant vascular findings are present. No enlarged abdominal or pelvic lymph nodes. Reproductive: Prostate is unremarkable. Other: Small left-greater-than-right fat containing inguinal hernias. No abdominopelvic ascites. Musculoskeletal: No acute or significant osseous findings. IMPRESSION: 1. No acute findings in the abdomen or pelvis. 2.  Aortic Atherosclerosis (ICD10-I70.0). Electronically Signed   By: LYetta GlassmanM.D.   On: 01/24/2022 10:38    Procedures Procedures    Medications Ordered in ED Medications  ketorolac (TORADOL) 15 MG/ML injection 15 mg (15 mg Intravenous Given 01/24/22  0906)  iohexol (OMNIPAQUE) 300 MG/ML solution 100 mL (100 mLs Intravenous Contrast Given 01/24/22 0944)    ED Course/ Medical Decision Making/ A&P                           Medical Decision Making Amount and/or Complexity of Data Reviewed Labs: ordered. Radiology: ordered.  Risk Prescription drug management.   51year old male presenting to ER due to concern for low back pain.  On exam well-appearing with grossly stable vital signs.  CT obtained, negative for diverticulitis, appendicitis, ureterolithiasis or other acute abdominal pelvic pathology.  No anemia, no leukocytosis, no electrolyte derangement.  Ultimately suspect more likely MSK in etiology.  Reassessed patient, discussed results, symptoms markedly improved.  Vitals remaining stable, patient remaining well-appearing, will DC home, advised follow-up with primary care doctor.  Additional history obtained from chart review, review of past discharge summaries, patient has prior history of intra-abdominal abscess from diverticulitis, intestinal perforation back in  2020 in 2019.  Kidney stone documented in ER visit in 2020.        Final Clinical Impression(s) / ED Diagnoses Final diagnoses:  Acute low back pain without sciatica, unspecified back pain laterality    Rx / DC Orders ED Discharge Orders     None         Lucrezia Starch, MD 01/24/22 1057

## 2022-01-24 NOTE — ED Triage Notes (Signed)
Pt presents POV with low back pain x1 week. Pt reports pain was so severe yesterday that he couldn't "think straight". Pt also reports his urine :feeling heavy", hx of kidney stones. Denies dysuria, burning with urination or abnormal penile dc. Pt reports pain is not associated with any activity

## 2022-01-24 NOTE — ED Notes (Signed)
Pt d/c home per MD order. Discharge summary reviewed, pt verbalizes understanding. Ambulatory off unit. No s/s of acute distress noted at discharge.  °

## 2022-01-24 NOTE — ED Notes (Signed)
Patient transported to CT 

## 2022-01-24 NOTE — Discharge Instructions (Signed)
Take Tylenol Motrin as needed for your pain.  If you are developing significant increase in the pain, any vomiting, fever or other new concerning symptom, please return to the emergency room for reassessment.  I would recommend following up with your primary care doctor.
